# Patient Record
Sex: Female | Born: 1950 | Race: White | Hispanic: No | Smoking: Never smoker
Health system: Southern US, Community
[De-identification: ages and names within clinical notes are randomized; demographics above are authoritative.]

## PROBLEM LIST (undated history)

## (undated) DIAGNOSIS — M1990 Unspecified osteoarthritis, unspecified site: Secondary | ICD-10-CM

## (undated) DIAGNOSIS — F32A Depression, unspecified: Secondary | ICD-10-CM

## (undated) DIAGNOSIS — G5762 Lesion of plantar nerve, left lower limb: Secondary | ICD-10-CM

## (undated) DIAGNOSIS — E039 Hypothyroidism, unspecified: Secondary | ICD-10-CM

## (undated) DIAGNOSIS — R42 Dizziness and giddiness: Principal | ICD-10-CM

## (undated) DIAGNOSIS — K219 Gastro-esophageal reflux disease without esophagitis: Secondary | ICD-10-CM

## (undated) DIAGNOSIS — F329 Major depressive disorder, single episode, unspecified: Secondary | ICD-10-CM

## (undated) DIAGNOSIS — I10 Essential (primary) hypertension: Secondary | ICD-10-CM

## (undated) DIAGNOSIS — E119 Type 2 diabetes mellitus without complications: Secondary | ICD-10-CM

## (undated) HISTORY — DX: Hypothyroidism, unspecified: E03.9

## (undated) HISTORY — PX: TUBAL LIGATION: SHX77

## (undated) HISTORY — DX: Essential (primary) hypertension: I10

## (undated) HISTORY — PX: SHOULDER ARTHROSCOPY W/ ROTATOR CUFF REPAIR: SHX2400

## (undated) HISTORY — DX: Morbid (severe) obesity due to excess calories: E66.01

## (undated) HISTORY — PX: CARPAL TUNNEL RELEASE: SHX101

## (undated) HISTORY — PX: TOE SURGERY: SHX1073

## (undated) HISTORY — DX: Dizziness and giddiness: R42

---

## 1998-06-15 ENCOUNTER — Emergency Department (HOSPITAL_COMMUNITY): Admission: EM | Admit: 1998-06-15 | Discharge: 1998-06-15 | Payer: Self-pay | Admitting: Emergency Medicine

## 2001-10-06 ENCOUNTER — Ambulatory Visit (HOSPITAL_COMMUNITY): Admission: RE | Admit: 2001-10-06 | Discharge: 2001-10-06 | Payer: Self-pay | Admitting: Otolaryngology

## 2001-10-06 ENCOUNTER — Encounter: Payer: Self-pay | Admitting: Otolaryngology

## 2002-11-18 ENCOUNTER — Emergency Department (HOSPITAL_COMMUNITY): Admission: EM | Admit: 2002-11-18 | Discharge: 2002-11-18 | Payer: Self-pay | Admitting: Emergency Medicine

## 2002-11-24 ENCOUNTER — Encounter: Payer: Self-pay | Admitting: Orthopaedic Surgery

## 2002-11-24 ENCOUNTER — Ambulatory Visit (HOSPITAL_COMMUNITY): Admission: RE | Admit: 2002-11-24 | Discharge: 2002-11-24 | Payer: Self-pay | Admitting: Orthopaedic Surgery

## 2003-10-13 ENCOUNTER — Emergency Department (HOSPITAL_COMMUNITY): Admission: EM | Admit: 2003-10-13 | Discharge: 2003-10-13 | Payer: Self-pay | Admitting: Emergency Medicine

## 2004-04-17 ENCOUNTER — Ambulatory Visit: Payer: Self-pay | Admitting: Family Medicine

## 2004-05-01 ENCOUNTER — Emergency Department (HOSPITAL_COMMUNITY): Admission: EM | Admit: 2004-05-01 | Discharge: 2004-05-01 | Payer: Self-pay | Admitting: Emergency Medicine

## 2004-05-02 ENCOUNTER — Ambulatory Visit: Payer: Self-pay | Admitting: Orthopedic Surgery

## 2004-05-15 ENCOUNTER — Ambulatory Visit (HOSPITAL_COMMUNITY): Admission: RE | Admit: 2004-05-15 | Discharge: 2004-05-15 | Payer: Self-pay | Admitting: Orthopedic Surgery

## 2004-05-28 ENCOUNTER — Ambulatory Visit: Payer: Self-pay | Admitting: Orthopedic Surgery

## 2004-07-03 ENCOUNTER — Ambulatory Visit (HOSPITAL_COMMUNITY): Admission: RE | Admit: 2004-07-03 | Discharge: 2004-07-04 | Payer: Self-pay | Admitting: Orthopedic Surgery

## 2004-07-16 ENCOUNTER — Encounter: Admission: RE | Admit: 2004-07-16 | Discharge: 2004-10-14 | Payer: Self-pay | Admitting: Orthopedic Surgery

## 2004-10-15 ENCOUNTER — Encounter: Admission: RE | Admit: 2004-10-15 | Discharge: 2004-10-30 | Payer: Self-pay | Admitting: Orthopedic Surgery

## 2006-01-09 ENCOUNTER — Ambulatory Visit: Payer: Self-pay | Admitting: Cardiology

## 2006-01-17 ENCOUNTER — Ambulatory Visit: Payer: Self-pay | Admitting: Cardiology

## 2006-02-27 ENCOUNTER — Ambulatory Visit: Payer: Self-pay | Admitting: Cardiology

## 2006-03-06 ENCOUNTER — Ambulatory Visit: Payer: Self-pay | Admitting: Cardiovascular Disease

## 2006-03-06 ENCOUNTER — Inpatient Hospital Stay (HOSPITAL_BASED_OUTPATIENT_CLINIC_OR_DEPARTMENT_OTHER): Admission: RE | Admit: 2006-03-06 | Discharge: 2006-03-06 | Payer: Self-pay | Admitting: Cardiovascular Disease

## 2006-03-21 ENCOUNTER — Ambulatory Visit: Payer: Self-pay | Admitting: Cardiology

## 2006-10-22 ENCOUNTER — Emergency Department (HOSPITAL_COMMUNITY): Admission: EM | Admit: 2006-10-22 | Discharge: 2006-10-22 | Payer: Self-pay | Admitting: Emergency Medicine

## 2010-10-12 NOTE — Assessment & Plan Note (Signed)
[facility]                            [facility]   [name], [name]                     MRN:          [id number]  DATE:02/27/2006                            DOB:          1951/02/21    REASON FOR OFFICE VISIT:  Scheduled follow up.   The patient now returns to the clinic after an initial referral to Dr. Lewayne Bunting here in the clinic for a consultation evaluation of chest pain and a  recent abnormal exercise stress test.  Please refer to his dictated office  note of August 16 for full details.  Dr. Andee Lineman felt that the pains were  rather atypical and in the setting of a few cardiac risk factors, he  scheduled a follow up low level Adenosine stress test during which the  patient reported no chest pain.  There were no significant EKG changes.  The  perfusion images, however, which were reviewed by Dr. Andee Lineman, suggested  possible mild high anterolateral defect which appeared to be reversible.  However, the study was felt to be low risk.  The ejection fraction was 54%.   The patient reports that she continues to have occasional chest discomfort  described as an ache sensation in the upper chest.  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  She, otherwise, does not  experience such symptoms when at rest.  Of note, her initial episode of  chest pain was described as a squeezing sensation beneath the upper  sternum.  However, she has not had this kind of severe chest pain since  being seen here in the emergency room several weeks ago.   CURRENT MEDICATIONS:  1. Aspirin 81 daily.  2. Lasix 20 daily.  3. Zoloft 100 daily.  4. Levoxyl 0.075 daily.  5. Omega fish oil as directed.   PHYSICAL EXAMINATION:  VITAL SIGNS:  Blood pressure 110/62, pulse 60 and regular, weight 249.  GENERAL:  60 year old female, obese, sitting upright, in no  apparent  distress.  HEENT:  Normocephalic, atraumatic.  NECK:  Palpable bilateral carotid pulses without bruits; unable to assess  JVD secondary to neck girth.  LUNGS:  Clear to auscultation in all fields.  HEART:  Regular rate and rhythm (S1 and S2), no significant murmurs.  ABDOMEN:  Protuberant, nontender.  EXTREMITIES:  No significant edema.  NEUROLOGICAL:  No focal deficit.   IMPRESSION:  1. Recurrent chest pain.      a.     Low risk Adenosine stress Cardiolite; EF 54% in August 2007.  2. Multiple cardiac risk factors.      a.     Hyperlipidemia.      b.     Family history.  3. Treated hypothyroidism.   PLAN:  The results of the recent stress test were reviewed by Dr. Andee Lineman who  felt that this represented a low risk study.  However, given the fact that  she continues to have recurrent chest discomfort, particularly associated  with strenuous activity at  work, the recommendation is to proceed with  further testing.  The patient was presented with the options of either a  cardiac CT scan versus diagnostic coronary angiography and she has opted for  the later for definitive exclusion of significant coronary artery disease.  Therefore, she will be scheduled for a procedure in the [facility] next week.   In the interim, the patient is to remain on aspirin.  We will place her on  low dose Imdur and also provide her with a prescription for nitroglycerin.  Lasix will be held the morning of the procedure.      ______________________________  Rozell Searing, PA-C    ______________________________  Learta Codding, MD,FACC    GS/MedQ  DD:  02/27/2006  DT:  02/28/2006  Job #:  [id number]

## 2010-10-12 NOTE — Assessment & Plan Note (Signed)
[facility]                            [facility] NOTE   TOY, SAMARIN                     MRN:          [id number]  DATE:03/21/2006                            DOB:          [birth year]    REASON FOR OFFICE VISIT:  Post-catheterization followup.   Ms. Sonnier returns to the clinic after being referred for a diagnostic  cardiac catheterization for further evaluation of recurrent atypical chest  pain.  Please refer to my office note of October 4 for full details.   A cardiac catheterization was done earlier in the month by Dr. Excell Seltzer and  revealed angiographically normal coronary arteries, and a normal left  ventricle.   The patient reports no complications of the right groin incision site.   The patient continues to have atypical constant upper chest discomfort.  As  I had noted in my previous note, she also continues to experience  considerable stress at her job.   MEDICATIONS:  1. Aspirin 81 q. day.  2. Zoloft 100 q. day.  3. Levoxyl 0.075 q. day.  4. Lasix 20 q. day.   PHYSICAL EXAMINATION:  Blood pressure 134/80, pulse 66 regular, weight 253.  GENERAL:  A 60 year old female morbidly obese, in no distress.  LUNGS:  Clear to auscultation in all fields.  HEART:  Regular rate and rhythm (S1, S2).  No significant murmurs.  ABDOMEN:  Protuberant.  Nontender.  EXTREMITIES:  Right groin stable with no ecchymosis, hematoma, or bruit on  auscultation; intact femoral and distal pulse.   IMPRESSION:  1. Noncardiac chest pain.      a.     Normal coronary angiogram October 2007.      b.     False-positive adenosine stress Cardiolite August 2007.  2. Multiple cardiac risk factors.      a.     Hyperlipidemia.      b.     Borderline hypertension.      c.     Obesity.      d.     Family history.  3. Treated hypothyroidism.   PLAN:  No further cardiac workup is recommended.  The patient has been  reassured that her symptoms are  not cardiac in origin.  Therefore, she has  been advised to followup with Dr. Olena Leatherwood for any persistent chest pain.  She will return to Dr. Andee Lineman on an as needed basis.     ______________________________  Rozell Searing, PA-C    ______________________________  Learta Codding, MD,FACC   GS/MedQ  DD: 03/21/2006  DT: 03/23/2006  Job #: [id number]   cc:   Lia Hopping

## 2010-10-12 NOTE — H&P (Signed)
NAMEMIRZA, KIDNEY              ACCOUNT NO.:  [id number]   MEDICAL RECORD NO.:  [id number]          PATIENT TYPE:  AMB   LOCATION:  DAY                           FACILITY:  [facility]   PHYSICIAN:  Vickki Hearing, M.D.DATE OF BIRTH:  May 06, 1951   DATE OF ADMISSION:  05/15/2004  DATE OF DISCHARGE:  12/20/2005LH                                HISTORY & PHYSICAL   PREOPERATIVE HISTORY AND PHYSICAL   CHIEF COMPLAINT:  Left shoulder pain.   HISTORY OF PRESENT ILLNESS:  This is a 60 year old female who presented with  left shoulder pain which was severe and constant, brought on by a fall of  one day's duration back in May 02, 2004.  She described the pain as  aching, had trouble moving her arm, associated with weakness and stiffness.  MRI was done and showed a torn rotator cuff to the left shoulder.   The patient is given informed consent for left rotator cuff repair.  She  understands that the complications and potential risks and benefits of this  procedure.  This includes possibility of re-tear, failure to heal,  stiffness, and weakness of the left shoulder.  Other surgical risks  including bleeding, infection, anesthetic death.  This was discussed with  the patient on a preoperative visit on May 28, 2004.   REVIEW OF SYSTEMS:  The patient reports that she has had poor circulation,  phlebitis, joint pain, gout, thyroid disease, depression, sinusitis, and  sinus problems.  She denied any weight loss, weight gain, fever, chills,  breathing abnormalities, GI problems, urinary problems, neurologic, skin  problems, or lymph node system problems.   MEDICATIONS:  Allergic to penicillin and sulfa.   PAST MEDICAL HISTORY:  Medical problems are thyroid disease, depression, and  poor circulation.   PAST SURGICAL HISTORY:  Includes tubal ligation and a neuroma excision.   PHARMACY:  [facility] [place].   MEDICATIONS:  Synthroid and Zoloft.   FAMILY HISTORY:  Heart and lung disease,  arthritis and cancer.   Family physician is Dr. Colon Flattery of [place].   SOCIAL HISTORY:  She is married.  She works in [employer] in the  [employer].  She does not smoke or drink.  Grade completed:  1 through  11.   PHYSICAL EXAMINATION:  VITAL SIGNS:  Exam shows a weight of 255, pulse of 74, respiratory rate is  16.  GENERAL:  She is well developed and well nourished.  Grooming and hygiene  are normal.  Body habitus;  Large.  No deformities.  Normal grooming,  developmental and nutrition.  CARDIOVASCULAR:  No swelling, normal pulses, no edema or tenderness.  LYMPH:  Lymph nodes and cervical spine were normal.  MUSCULOSKELETAL:  Exam showed normal gait and station.  EXTREMITIES:  Left upper extremity showed painful range of motion.  Her  range of motion was limited to abduction of approximately 40 degrees,  external rotation 30 degrees, internal rotation zero.  Passive range of  motion hurt throughout the entire arc 0-120.  Her drop test was positive.  SKIN:  Normal with no bruising.  NEUROLOGIC/PSYCH:  Exam showed  normal findings.   MEDICAL DECISION MAKING:  Medical decision-making included radiographs that  showed no fracture and an MRI which showed a torn rotator cuff to the left  shoulder.   DIAGNOSIS:  Left rotator cuff tear.   PLAN:  Open left rotator cuff repair in a modified beach chair position on a  regular bed.  The patient understands she will need six months of  rehabilitation.     Weyman Croon   SEH/MEDQ  D:  05/30/2004  T:  05/30/2004  Job:  [id number]   cc:   Colon Flattery, D.O.  [address]  [place]  [place] [address]  Fax: [phone]

## 2010-10-12 NOTE — Op Note (Signed)
NAME:  [name], [name]              ACCOUNT NO.:  [id number]   MEDICAL RECORD NO.:  [id number]          PATIENT TYPE:  REC   LOCATION:  [facility]                         FACILITY:  [facility]   PHYSICIAN:  Burnard Bunting, M.D.    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   OPERATION PERFORMED:  Left shoulder manipulation under anesthesia with  extensive debridement of the rotator interval and intra-articular synovitis,  subacromial decompression and mini-open rotator cuff repair.   SURGEON:  Burnard Bunting, M.D.   ASSISTANT:  Jerolyn Shin. Tresa Res, M.D.   ANESTHESIA:  General endotracheal.   ESTIMATED BLOOD LOSS:  75 mL   DRAINS:  None.   DESCRIPTION OF PROCEDURE:  The patient was brought to the operating room  where general endotracheal was induced.  Preoperative antibiotics were  administered.  The patient was placed in the Schlein positioner.  She was  noted to have restricted external rotation on the left hand side at 15  degrees of abduction to about 40 degrees on the left compared to 70 on the  right.  She also was lacking a full 30 degrees of forward flexion.  The  patient's arm was gently manipulated into full forward flexion by minimizing  a fulcrum length, placing the right hand near the armpit.  Audible tearing  of tissue was noted.  The patient was then manipulated into full abduction  and external rotation.  The patient was placed in Schlein positioner and  then set upright.  The right arm and both legs were well padded.  The  patient's right arm, shoulder and hand were then prepped with DuraPrep  solution and then draped in sterile manner.  Topographical anatomy of the  left  shoulder was identified including posterolateral and anterior margins  of the acromion as well as the coracoid process.  Solution of saline with  epinephrine was injected in the subacromial space.  Saline was then injected  into the visible joint.  Diagnostic arthroscopy was performed.  The patient  was noted to have synovitis associated with frozen shoulder.  This was  present in the rotator interval.  The patient also had a moderate to large  rotator cuff tear of the supraspinatus, measuring approximately 2.5 x 2.5  cm.  The biceps anchor itself was stable and intact.  Anterior portal was  created under direct visualization with the spinal needle and extensive  rotator interval of debridement was performed between the biceps tendon and  subscapularis.  Synovitis was also debrided from the rotator interval, from  the rotator cuff, the capsule.  Glenohumeral articular surfaces were intact.  Biceps tendon was otherwise intact.  At this time the scope was placed  through the subacromial space.  Bursectomy and subacromial decompression was  performed with release of resection of the coracoacromial ligament.  At this  time instruments were removed from the portals.  The anterior and posterior  portal were closed using 3-0 nylon suture.  An incision was then made off  the anterolateral margin of the acromion.  Skin and subcutaneous tissue were  sharply divided.  Deltoid was split for measured distance of 4 cm from the  anterolateral margin of the acromion.  Stay suture was placed at the deltoid  split.  Rotator cuff tear was visualized.  The rotator cuff tear involved  the anterior edge of the supraspinatus with some retraction posteriorly.  The tear was mobilized anteriorly and reattached to a prepared bleeding bony  bed using a combination of curved bone tunnels and suture anchors.  In this  manner, the rotator cuff was reapproximated to its attachment on the greater  tuberosity.  Arm was taken  through a range of motion and repair was found to  be stable.  No impingement was present.  At this time the incision was  thoroughly irrigated and closed using a combination of #1 Vicryl suture to  reapproximate the deltoid split.  Skin was closed using interrupted inverted  0 Vicryl, 2-0 Vicryl and running 3-0 pullout Prolene.  The patient was then  placed in the bulky dressing and shoulder immobilizer.  The patient  tolerated the procedure well without immediate complication.      GSD/MEDQ  D:  07/18/2004  T:  07/18/2004  Job:  [id number]

## 2010-10-12 NOTE — Assessment & Plan Note (Signed)
[facility]                            [facility]   [name], [name]                     MRN:          [id number]  DATE:01/09/2006                            DOB:          11-16-1950    REASON FOR CONSULTATION:  Follow up abnormal exercise treadmill test  performed by Dr. Olena Leatherwood.   PRESENT ILLNESS:  The patient is a 60 year old female with no prior cardiac  history.  The patient, several weeks ago, experienced an episode of right-  sided neck pain.  She stated that the pain was radiating down into the  chest.  I felt like a sharp, knife-like pain with a throbbing sensation.  She stated that the pain lasted several hours and it prompted her to come to  the emergency room.  She ruled out for myocardial infarction.  Sometime  later, the patient underwent an exercise treadmill test performed by Dr.  Olena Leatherwood which showed a parallel lateral ST segment depression.  Since that  time, the patient has had no further substernal chest pain.  She is not  physically very active and does report dyspnea on walking a flights of  stairs.  She denies having any heavy substernal chest pressure on exertion.  She has no orthopnea or PND.  She has no palpitations or syncope.  She did  receive a workup for gallbladder disease, which was reportedly negative.   ALLERGIES:  PENICILLIN, SULFA.   MEDICATIONS:  Calcium, magnesium, zinc, Ultimate Omega, fish oil, furosemide  20 mg a day, Levoxyl 75 mg a day, and Zoloft.   PAST MEDICAL HISTORY:  History of hypothyroidism and tubal ligation.   SOCIAL:  The patient denies any tobacco or alcohol use.  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   FAMILY HISTORY:  Notable for both mother and father that had heart disease  but at a later age.   REVIEW OF SYSTEMS:  As per HPI.  No nausea or vomiting.  No fever or chills.  No melena or hematochezia.  No dysuria or frequency.   PHYSICAL EXAMINATION:   VITAL SIGNS:  Blood pressure is 132/80, heart rate is  56 beats per minute.  NECK:  Exam reveals normal carotid upstroke.  No carotid bruits.  LUNGS:  Clear breath sounds bilaterally.  HEART:  Regular rate and rhythm.  Normal S1, S2.  No murmurs, rubs, or  gallops.  ABDOMEN:  Soft, nontender.  No rebound or guarding.  Good bowel sounds.  EXTREMITY EXAM:  Reveals no cyanosis, clubbing, or edema.  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   PROBLEM LIST:  1. Atypical chest pain.  2. Abnormal exercise treadmill test with lateral ST segment changes.  Rule      out false-positive study.  3. Minor cardiac risk factors.  4. Venous insufficiency.   PLAN:  1. The patient's symptoms are rather atypical for angina.  The patient has      a few cardiac risk factors.  2. The patient could have had a false-positive exercise treadmill test,      and given  her low-to intermediate risk for coronary artery disease, we      will proceed with an adenosine Cardiolite study with 4-minute protocol.      The patient will follow up after test results, and if positive, a      consideration needs to be given to cardiac catheterization.                                   Learta Codding, MD, [facility]   GED/MedQ  DD:  01/09/2006  DT:  01/09/2006  Job #:  [id number]   cc:   Lia Hopping

## 2010-10-12 NOTE — Cardiovascular Report (Signed)
NAMETEEA, DUCEY              ACCOUNT NO.:  [id number]   MEDICAL RECORD NO.:  [id number]          PATIENT TYPE:  OIB   LOCATION:  1961                         FACILITY:  [facility]   PHYSICIAN:  Veverly Fells. Excell Seltzer, MD  DATE OF BIRTH:  [birth year]   DATE OF PROCEDURE:  DATE OF DISCHARGE:                              CARDIAC CATHETERIZATION   DATE OF PROCEDURE:  March 06, 2006.   PROCEDURES:  1. Left heart catheterization.  2. Selective coronary angiography.  3. Left ventricular angiography.   INDICATION:  Mrs. [name] is a 60 year old woman who has had recurrent chest  pain and underwent an adenosine myocardial perfusion scan that was abnormal.  In the setting of her persistent chest pain and abnormal cardiovascular  functional study, she was referred for cardiac catheterization.   Access right femoral artery, 4-French.   COMPLICATIONS:  None.   PROCEDURAL DETAILS:  The risks and indications of the procedure were  explained in detail to the patient.  Informed consent was obtained.  The  right groin was prepped, draped and anesthetized with 1% lidocaine.  Using a  modified Seldinger technique, a 4-French arterial sheath was placed in the  right common femoral artery.  Multiple angiographic views of the left and  right coronary arteries were taken.  The left coronary artery was injected  with a preformed 4-French JL-4 catheter.  The right coronary artery was  imaged with a preformed 4-French 3DRC catheter.  Following selective  coronary angiography, a 4-French angled pigtail catheter was placed into the  left ventricle.  Left ventricular pressures were recorded.  A 30-degree RAL  left ventriculogram was performed.  Following left ventriculography, a  pullback across the aortic valve was done.  At the conclusion of the case,  the right femoral arterial sheath was pulled and manual pressure was used  for hemostasis.   FINDINGS:  Aortic pressure 136/76 with a mean of 101.  Left  ventricular  pressure was 151/18 with an end-diastolic pressure of 23.  On the pullback,  the left ventricular systolic pressure was 145 and the aortic systolic  pressure was 144.  There was no gradient.   Selective coronary angiography:  The left mainstem is angiographically  normal.  It bifurcates into the LAD and left circumflex.  The LAD is medium  caliber.  It courses down to the left ventricular apex.  It gives off a very  small first diagonal and then a medium-caliber second diagonal branch.  The  LAD and diagonal branches are angiographically normal.   The left circumflex is large diameter.  It supplies two very small obtuse  marginal branches and then a large left posterolateral branch that  bifurcates into twin vessels.  The left circumflex system is  angiographically normal.   Right coronary artery:  The right coronary artery is dominant.  It is medium  caliber.  Distally, it bifurcates into a PDA as well as one medium-sized  posterolateral branch.  There are multiple small PLs.  The right coronary  artery is angiographically normal.   Left ventriculogram performed on the third-degree RAO projections  demonstrates normal  systolic left ventricular function.  Left ventricular  ejection fraction 65%, no mitral regurgitation is present.   ASSESSMENT:  1. Normal coronary arteries.  2. Normal left ventricular function.   Patient should continue with risk factor modification.  She does not have  any significant cardiac disease seen on catheterization.  Of note, she did  have an increased left ventricular end-diastolic pressure and will require  monitoring of her blood pressure as an outpatient.  She does not carry the  diagnosis of hypertension and in reviewing in Dr. Margarita Mail most recent note,  her blood pressure was normal on his evaluation.      Veverly Fells. Excell Seltzer, MD  Electronically Signed     MDC/MEDQ  D:  03/06/2006  T:  03/06/2006  Job:  [id number]   cc:   Learta Codding, MD,FACC

## 2012-10-02 ENCOUNTER — Other Ambulatory Visit: Payer: Self-pay | Admitting: Orthopedic Surgery

## 2012-10-02 DIAGNOSIS — M25511 Pain in right shoulder: Secondary | ICD-10-CM

## 2012-10-12 ENCOUNTER — Ambulatory Visit
Admission: RE | Admit: 2012-10-12 | Discharge: 2012-10-12 | Disposition: A | Discharge status: Home / Self Care | Payer: BC Managed Care – PPO | Source: Ambulatory Visit | Attending: Orthopedic Surgery | Admitting: Orthopedic Surgery

## 2012-10-12 DIAGNOSIS — M25511 Pain in right shoulder: Secondary | ICD-10-CM

## 2012-10-12 MED ORDER — IOHEXOL 180 MG/ML  SOLN
15.0000 mL | Freq: Once | INTRAMUSCULAR | Status: AC | PRN
  Administered 2012-10-12: 15 mL via INTRA_ARTICULAR

## 2012-12-08 ENCOUNTER — Encounter (HOSPITAL_COMMUNITY): Payer: Self-pay | Admitting: Pharmacy Technician

## 2012-12-10 ENCOUNTER — Other Ambulatory Visit: Payer: Self-pay | Admitting: Orthopedic Surgery

## 2012-12-14 ENCOUNTER — Encounter (HOSPITAL_COMMUNITY)
Admission: RE | Admit: 2012-12-14 | Discharge: 2012-12-14 | Disposition: A | Discharge status: Home / Self Care | Payer: BC Managed Care – PPO | Source: Ambulatory Visit | Attending: Orthopedic Surgery | Admitting: Orthopedic Surgery

## 2012-12-14 ENCOUNTER — Encounter (HOSPITAL_COMMUNITY): Payer: Self-pay

## 2012-12-14 DIAGNOSIS — Z01810 Encounter for preprocedural cardiovascular examination: Secondary | ICD-10-CM | POA: Insufficient documentation

## 2012-12-14 DIAGNOSIS — Z01818 Encounter for other preprocedural examination: Secondary | ICD-10-CM | POA: Insufficient documentation

## 2012-12-14 DIAGNOSIS — Z01812 Encounter for preprocedural laboratory examination: Secondary | ICD-10-CM | POA: Insufficient documentation

## 2012-12-14 HISTORY — DX: Type 2 diabetes mellitus without complications: E11.9

## 2012-12-14 HISTORY — DX: Lesion of plantar nerve, left lower limb: G57.62

## 2012-12-14 HISTORY — DX: Gastro-esophageal reflux disease without esophagitis: K21.9

## 2012-12-14 HISTORY — DX: Major depressive disorder, single episode, unspecified: F32.9

## 2012-12-14 HISTORY — DX: Unspecified osteoarthritis, unspecified site: M19.90

## 2012-12-14 HISTORY — DX: Depression, unspecified: F32.A

## 2012-12-14 LAB — COMPREHENSIVE METABOLIC PANEL
ALT: 36 U/L — ABNORMAL HIGH (ref 0–35)
AST: 27 U/L (ref 0–37)
Alkaline Phosphatase: 56 U/L (ref 39–117)
CO2: 28 mEq/L (ref 19–32)
Calcium: 9.2 mg/dL (ref 8.4–10.5)
Chloride: 103 mEq/L (ref 96–112)
GFR calc non Af Amer: >90 mL/min (ref >90)
Glucose, Bld: 125 mg/dL — ABNORMAL HIGH (ref 70–99)
Potassium: 4.6 mEq/L (ref 3.5–5.1)
Sodium: 140 mEq/L (ref 135–145)

## 2012-12-14 LAB — CBC
Hemoglobin: 13.5 g/dL (ref 12.0–15.0)
Platelets: 212 10*3/uL (ref 150–400)
RBC: 4.3 MIL/uL (ref 3.87–5.11)
WBC: 6.2 10*3/uL (ref 4.0–10.5)

## 2012-12-14 NOTE — Pre-Procedure Instructions (Signed)
[name]  12/14/2012   Your procedure is scheduled on:  Tuesday, July 29th   Report to [facility] at 9:00 AM.  Call this number if you have problems the morning of surgery: [phone]   Remember:   Do not eat food or drink liquids after midnight Monday.   Take these medicines the morning of surgery with A SIP OF WATER:  Zoloft, Levothyroxine   Do not wear jewelry, make-up or nail polish.  Do not wear lotions, powders, or perfumes. You may NOT wear deodorant.  Do not shave underarms & legs 48 hours prior to surgery.    Do not bring valuables to the hospital.  [facility] is not responsible  for any belongings or valuables.   Contacts, dentures or bridgework may not be worn into surgery.  Leave suitcase in the car. After surgery it may be brought to your room.  For patients admitted to the hospital, checkout time is 11:00 AM the day of discharge.   Patients discharged the day of surgery will not be allowed to drive home and will need              A responsible adult to stay with you for the first 24 hrs.   Name and phone number of your driver:    Special Instructions: Shower using CHG 2 nights before surgery and the night before surgery.  If you shower the day of surgery use CHG.  Use special wash - you have one bottle of CHG for all showers.  You should use approximately 1/3 of the bottle for each shower.   Please read over the following fact sheets that you were given: Pain Booklet and Surgical Site Infection Prevention

## 2012-12-14 NOTE — Progress Notes (Signed)
Pt has not seen cardio for a while now....denies any cp, sob, etc.  She actually cannot remember when and why she went back in 2007...Marland KitchenDA Sees Prudy Feeler up in [place] @ [facility].........DA

## 2012-12-22 ENCOUNTER — Observation Stay (HOSPITAL_COMMUNITY)
Admission: RE | Admit: 2012-12-22 | Discharge: 2012-12-23 | Disposition: A | Discharge status: Home / Self Care | Payer: BC Managed Care – PPO | Source: Ambulatory Visit | Attending: Orthopedic Surgery | Admitting: Orthopedic Surgery

## 2012-12-22 ENCOUNTER — Encounter (HOSPITAL_COMMUNITY): Payer: Self-pay | Admitting: Anesthesiology

## 2012-12-22 ENCOUNTER — Ambulatory Visit (HOSPITAL_COMMUNITY): Payer: BC Managed Care – PPO | Admitting: Anesthesiology

## 2012-12-22 ENCOUNTER — Encounter (HOSPITAL_COMMUNITY): Payer: Self-pay | Admitting: *Deleted

## 2012-12-22 ENCOUNTER — Encounter (HOSPITAL_COMMUNITY)
Admission: RE | Disposition: A | Discharge status: Home / Self Care | Payer: Self-pay | Source: Ambulatory Visit | Attending: Orthopedic Surgery

## 2012-12-22 DIAGNOSIS — G5600 Carpal tunnel syndrome, unspecified upper limb: Secondary | ICD-10-CM | POA: Insufficient documentation

## 2012-12-22 DIAGNOSIS — M719 Bursopathy, unspecified: Principal | ICD-10-CM | POA: Insufficient documentation

## 2012-12-22 DIAGNOSIS — M75101 Unspecified rotator cuff tear or rupture of right shoulder, not specified as traumatic: Secondary | ICD-10-CM

## 2012-12-22 DIAGNOSIS — M67919 Unspecified disorder of synovium and tendon, unspecified shoulder: Principal | ICD-10-CM | POA: Insufficient documentation

## 2012-12-22 DIAGNOSIS — E119 Type 2 diabetes mellitus without complications: Secondary | ICD-10-CM | POA: Insufficient documentation

## 2012-12-22 DIAGNOSIS — Z5333 Arthroscopic surgical procedure converted to open procedure: Secondary | ICD-10-CM | POA: Insufficient documentation

## 2012-12-22 HISTORY — PX: SHOULDER ARTHROSCOPY WITH ROTATOR CUFF REPAIR: SHX5685

## 2012-12-22 HISTORY — PX: CARPAL TUNNEL RELEASE: SHX101

## 2012-12-22 LAB — GLUCOSE, CAPILLARY: Glucose-Capillary: 199 mg/dL — ABNORMAL HIGH (ref 70–99)

## 2012-12-22 SURGERY — ARTHROSCOPY, SHOULDER, WITH ROTATOR CUFF REPAIR
Anesthesia: General | Site: Wrist | Laterality: Right | Wound class: Clean

## 2012-12-22 MED ORDER — LIDOCAINE HCL (CARDIAC) 20 MG/ML IV SOLN
INTRAVENOUS | Status: DC | PRN
  Administered 2012-12-22: 40 mg via INTRAVENOUS

## 2012-12-22 MED ORDER — MIDAZOLAM HCL 2 MG/2ML IJ SOLN
INTRAMUSCULAR | Status: AC
  Administered 2012-12-22: 1 mg via INTRAVENOUS
  Filled 2012-12-22: qty 2

## 2012-12-22 MED ORDER — ACETAMINOPHEN 650 MG RE SUPP: 650.0000 mg | Freq: Four times a day (QID) | RECTAL | Status: DC | PRN

## 2012-12-22 MED ORDER — POLYVINYL ALCOHOL 1.4 % OP SOLN
2.0000 [drp] | Freq: Two times a day (BID) | OPHTHALMIC | Status: DC
  Filled 2012-12-22: qty 15

## 2012-12-22 MED ORDER — OXYCODONE HCL 5 MG PO TABS
5.0000 mg | ORAL_TABLET | ORAL | Status: DC | PRN
  Administered 2012-12-23 (×3): 10 mg via ORAL
  Filled 2012-12-22 (×3): qty 2

## 2012-12-22 MED ORDER — SERTRALINE HCL 100 MG PO TABS
100.0000 mg | ORAL_TABLET | Freq: Every day | ORAL | Status: DC
  Administered 2012-12-22 – 2012-12-23 (×2): 100 mg via ORAL
  Filled 2012-12-22 (×2): qty 1

## 2012-12-22 MED ORDER — SODIUM CHLORIDE 0.9 % IR SOLN
Status: DC | PRN
  Administered 2012-12-22: 6000 mL

## 2012-12-22 MED ORDER — MELOXICAM 7.5 MG PO TABS
7.5000 mg | ORAL_TABLET | Freq: Every day | ORAL | Status: DC
  Administered 2012-12-22 – 2012-12-23 (×2): 7.5 mg via ORAL
  Filled 2012-12-22 (×2): qty 1

## 2012-12-22 MED ORDER — OXYCODONE HCL 5 MG PO TABS: 5.0000 mg | ORAL_TABLET | Freq: Once | ORAL | Status: DC | PRN

## 2012-12-22 MED ORDER — FENTANYL CITRATE 0.05 MG/ML IJ SOLN: 50.0000 ug | INTRAMUSCULAR | Status: DC | PRN

## 2012-12-22 MED ORDER — FENTANYL CITRATE 0.05 MG/ML IJ SOLN
INTRAMUSCULAR | Status: DC | PRN
  Administered 2012-12-22: 50 ug via INTRAVENOUS
  Administered 2012-12-22: 100 ug via INTRAVENOUS

## 2012-12-22 MED ORDER — CLINDAMYCIN PHOSPHATE 900 MG/50ML IV SOLN
INTRAVENOUS | Status: DC | PRN
  Administered 2012-12-22: 900 mg via INTRAVENOUS

## 2012-12-22 MED ORDER — MIDAZOLAM HCL 2 MG/2ML IJ SOLN: 1.0000 mg | INTRAMUSCULAR | Status: DC | PRN

## 2012-12-22 MED ORDER — CLINDAMYCIN PHOSPHATE 600 MG/50ML IV SOLN
600.0000 mg | Freq: Four times a day (QID) | INTRAVENOUS | Status: AC
  Administered 2012-12-22 – 2012-12-23 (×3): 600 mg via INTRAVENOUS
  Filled 2012-12-22 (×3): qty 50

## 2012-12-22 MED ORDER — POLYETHYL GLYCOL-PROPYL GLYCOL 0.4-0.3 % OP SOLN: 2.0000 [drp] | Freq: Two times a day (BID) | OPHTHALMIC | Status: DC

## 2012-12-22 MED ORDER — BUPIVACAINE HCL (PF) 0.25 % IJ SOLN
INTRAMUSCULAR | Status: DC | PRN
  Administered 2012-12-22: 15 mL

## 2012-12-22 MED ORDER — ONDANSETRON HCL 4 MG/2ML IJ SOLN: 4.0000 mg | Freq: Four times a day (QID) | INTRAMUSCULAR | Status: DC | PRN

## 2012-12-22 MED ORDER — SODIUM CHLORIDE 0.9 % IV SOLN
10.0000 mg | INTRAVENOUS | Status: DC | PRN
  Administered 2012-12-22: 10 ug/min via INTRAVENOUS

## 2012-12-22 MED ORDER — 0.9 % SODIUM CHLORIDE (POUR BTL) OPTIME
TOPICAL | Status: DC | PRN
  Administered 2012-12-22 (×3): 1000 mL

## 2012-12-22 MED ORDER — METOCLOPRAMIDE HCL 5 MG/ML IJ SOLN: 5.0000 mg | Freq: Three times a day (TID) | INTRAMUSCULAR | Status: DC | PRN

## 2012-12-22 MED ORDER — MENTHOL 3 MG MT LOZG: 1.0000 | LOZENGE | OROMUCOSAL | Status: DC | PRN

## 2012-12-22 MED ORDER — METOCLOPRAMIDE HCL 5 MG/ML IJ SOLN: 10.0000 mg | Freq: Once | INTRAMUSCULAR | Status: DC | PRN

## 2012-12-22 MED ORDER — OXYCODONE HCL 5 MG/5ML PO SOLN: 5.0000 mg | Freq: Once | ORAL | Status: DC | PRN

## 2012-12-22 MED ORDER — DOCUSATE SODIUM 100 MG PO CAPS
100.0000 mg | ORAL_CAPSULE | Freq: Two times a day (BID) | ORAL | Status: DC
  Administered 2012-12-22 – 2012-12-23 (×2): 100 mg via ORAL
  Filled 2012-12-22 (×3): qty 1

## 2012-12-22 MED ORDER — ASPIRIN 325 MG PO TABS
325.0000 mg | ORAL_TABLET | Freq: Every day | ORAL | Status: DC
  Administered 2012-12-22 – 2012-12-23 (×2): 325 mg via ORAL
  Filled 2012-12-22 (×2): qty 1

## 2012-12-22 MED ORDER — ONDANSETRON HCL 4 MG/2ML IJ SOLN
INTRAMUSCULAR | Status: DC | PRN
  Administered 2012-12-22 (×2): 4 mg via INTRAVENOUS

## 2012-12-22 MED ORDER — METFORMIN HCL 500 MG PO TABS
500.0000 mg | ORAL_TABLET | Freq: Every day | ORAL | Status: DC
  Administered 2012-12-23: 500 mg via ORAL
  Filled 2012-12-22 (×2): qty 1

## 2012-12-22 MED ORDER — FENTANYL CITRATE 0.05 MG/ML IJ SOLN
INTRAMUSCULAR | Status: AC
  Administered 2012-12-22: 50 ug via INTRAVENOUS
  Filled 2012-12-22: qty 2

## 2012-12-22 MED ORDER — HYDROMORPHONE HCL PF 1 MG/ML IJ SOLN: 0.5000 mg | INTRAMUSCULAR | Status: DC | PRN

## 2012-12-22 MED ORDER — PHENOL 1.4 % MT LIQD: 1.0000 | OROMUCOSAL | Status: DC | PRN

## 2012-12-22 MED ORDER — ACETAMINOPHEN 325 MG PO TABS
650.0000 mg | ORAL_TABLET | Freq: Four times a day (QID) | ORAL | Status: DC | PRN
  Administered 2012-12-22: 650 mg via ORAL
  Filled 2012-12-22: qty 2

## 2012-12-22 MED ORDER — CLINDAMYCIN PHOSPHATE 900 MG/50ML IV SOLN
900.0000 mg | INTRAVENOUS | Status: DC
  Filled 2012-12-22: qty 50

## 2012-12-22 MED ORDER — HYDROMORPHONE HCL PF 1 MG/ML IJ SOLN: 0.2500 mg | INTRAMUSCULAR | Status: DC | PRN

## 2012-12-22 MED ORDER — DEXAMETHASONE SODIUM PHOSPHATE 4 MG/ML IJ SOLN
INTRAMUSCULAR | Status: DC | PRN
  Administered 2012-12-22: 4 mg via INTRAVENOUS

## 2012-12-22 MED ORDER — EPHEDRINE SULFATE 50 MG/ML IJ SOLN
INTRAMUSCULAR | Status: DC | PRN
  Administered 2012-12-22: 5 mg via INTRAVENOUS
  Administered 2012-12-22 (×4): 10 mg via INTRAVENOUS
  Administered 2012-12-22: 5 mg via INTRAVENOUS

## 2012-12-22 MED ORDER — LEVOTHYROXINE SODIUM 75 MCG PO TABS
75.0000 ug | ORAL_TABLET | Freq: Every day | ORAL | Status: DC
  Administered 2012-12-23: 75 ug via ORAL
  Filled 2012-12-22 (×2): qty 1

## 2012-12-22 MED ORDER — POTASSIUM CHLORIDE IN NACL 20-0.9 MEQ/L-% IV SOLN
INTRAVENOUS | Status: AC
  Administered 2012-12-22: 18:00:00 via INTRAVENOUS
  Filled 2012-12-22 (×2): qty 1000

## 2012-12-22 MED ORDER — ONDANSETRON HCL 4 MG PO TABS: 4.0000 mg | ORAL_TABLET | Freq: Four times a day (QID) | ORAL | Status: DC | PRN

## 2012-12-22 MED ORDER — METOCLOPRAMIDE HCL 10 MG PO TABS: 5.0000 mg | ORAL_TABLET | Freq: Three times a day (TID) | ORAL | Status: DC | PRN

## 2012-12-22 MED ORDER — LACTATED RINGERS IV SOLN
INTRAVENOUS | Status: DC
  Administered 2012-12-22 (×2): via INTRAVENOUS

## 2012-12-22 MED ORDER — PROPOFOL 10 MG/ML IV BOLUS
INTRAVENOUS | Status: DC | PRN
  Administered 2012-12-22: 200 mg via INTRAVENOUS

## 2012-12-22 MED ORDER — ROCURONIUM BROMIDE 100 MG/10ML IV SOLN
INTRAVENOUS | Status: DC | PRN
  Administered 2012-12-22: 50 mg via INTRAVENOUS

## 2012-12-22 SURGICAL SUPPLY — 102 items
ANCHOR CORKSCREW BIO 5.5 FT (Anchor) ×6 IMPLANT
BANDAGE ELASTIC 3 VELCRO ST LF (GAUZE/BANDAGES/DRESSINGS) ×6 IMPLANT
BANDAGE ELASTIC 4 VELCRO ST LF (GAUZE/BANDAGES/DRESSINGS) ×3 IMPLANT
BANDAGE GAUZE ELAST BULKY 4 IN (GAUZE/BANDAGES/DRESSINGS) ×3 IMPLANT
BENZOIN TINCTURE PRP APPL 2/3 (GAUZE/BANDAGES/DRESSINGS) ×3 IMPLANT
BIT DRILL TAK (DRILL) IMPLANT
BLADE CUDA 5.5 (BLADE) IMPLANT
BLADE CUTTER GATOR 3.5 (BLADE) ×3 IMPLANT
BLADE GREAT WHITE 4.2 (BLADE) ×3 IMPLANT
BLADE SURG 11 STRL SS (BLADE) ×3 IMPLANT
BLADE SURG 15 STRL LF DISP TIS (BLADE) ×2 IMPLANT
BLADE SURG 15 STRL SS (BLADE) ×1
BNDG ESMARK 4X9 LF (GAUZE/BANDAGES/DRESSINGS) ×3 IMPLANT
BUR GATOR 2.9 (BURR) IMPLANT
BUR OVAL 6.0 (BURR) ×3 IMPLANT
CANNULA SHOULDER 7CM (CANNULA) IMPLANT
CARTRIDGE CURVETEK MED (MISCELLANEOUS) IMPLANT
CARTRIDGE CURVETEK XLRG (MISCELLANEOUS) IMPLANT
CLOTH BEACON ORANGE TIMEOUT ST (SAFETY) ×3 IMPLANT
CLSR STERI-STRIP ANTIMIC 1/2X4 (GAUZE/BANDAGES/DRESSINGS) ×3 IMPLANT
CORDS BIPOLAR (ELECTRODE) ×6 IMPLANT
COVER SURGICAL LIGHT HANDLE (MISCELLANEOUS) ×3 IMPLANT
CUFF TOURNIQUET SINGLE 18IN (TOURNIQUET CUFF) ×6 IMPLANT
CUFF TOURNIQUET SINGLE 24IN (TOURNIQUET CUFF) IMPLANT
DRAPE INCISE IOBAN 66X45 STRL (DRAPES) ×9 IMPLANT
DRAPE STERI 35X30 U-POUCH (DRAPES) ×3 IMPLANT
DRAPE SURG 17X23 STRL (DRAPES) ×3 IMPLANT
DRAPE U-SHAPE 47X51 STRL (DRAPES) ×6 IMPLANT
DRILL TAK (DRILL)
DRSG PAD ABDOMINAL 8X10 ST (GAUZE/BANDAGES/DRESSINGS) ×6 IMPLANT
DURAPREP 26ML APPLICATOR (WOUND CARE) ×3 IMPLANT
ELECT MENISCUS 165MM 90D (ELECTRODE) IMPLANT
ELECT REM PT RETURN 9FT ADLT (ELECTROSURGICAL) ×3
ELECTRODE REM PT RTRN 9FT ADLT (ELECTROSURGICAL) ×2 IMPLANT
FILTER STRAW FLUID ASPIR (MISCELLANEOUS) ×3 IMPLANT
GAUZE XEROFORM 1X8 LF (GAUZE/BANDAGES/DRESSINGS) ×6 IMPLANT
GLOVE BIO SURGEON ST LM GN SZ9 (GLOVE) ×3 IMPLANT
GLOVE BIO SURGEON STRL SZ8.5 (GLOVE) ×3 IMPLANT
GLOVE BIOGEL PI IND STRL 8 (GLOVE) ×2 IMPLANT
GLOVE BIOGEL PI INDICATOR 8 (GLOVE) ×1
GLOVE ECLIPSE 7.0 STRL STRAW (GLOVE) ×3 IMPLANT
GLOVE SURG ORTHO 8.0 STRL STRW (GLOVE) ×3 IMPLANT
GLOVE SURG SS PI 7.0 STRL IVOR (GLOVE) ×3 IMPLANT
GOWN PREVENTION PLUS LG XLONG (DISPOSABLE) IMPLANT
GOWN PREVENTION PLUS XLARGE (GOWN DISPOSABLE) ×3 IMPLANT
GOWN STRL NON-REIN LRG LVL3 (GOWN DISPOSABLE) ×9 IMPLANT
KIT BASIN OR (CUSTOM PROCEDURE TRAY) ×3 IMPLANT
KIT BIO-TENODESIS 3X8 DISP (MISCELLANEOUS) ×1
KIT INSRT BABSR STRL DISP BTN (MISCELLANEOUS) ×2 IMPLANT
KIT ROOM TURNOVER OR (KITS) ×3 IMPLANT
LOOP VESSEL MAXI BLUE (MISCELLANEOUS) IMPLANT
MANIFOLD NEPTUNE II (INSTRUMENTS) ×3 IMPLANT
NDL SUT 6 .5 CRC .975X.05 MAYO (NEEDLE) ×2 IMPLANT
NEEDLE HYPO 25GX1X1/2 BEV (NEEDLE) IMPLANT
NEEDLE HYPO 25X1 1.5 SAFETY (NEEDLE) ×3 IMPLANT
NEEDLE MAYO TAPER (NEEDLE) ×1
NEEDLE SPNL 18GX3.5 QUINCKE PK (NEEDLE) ×3 IMPLANT
NS IRRIG 1000ML POUR BTL (IV SOLUTION) ×3 IMPLANT
PACK ORTHO EXTREMITY (CUSTOM PROCEDURE TRAY) ×3 IMPLANT
PACK SHOULDER (CUSTOM PROCEDURE TRAY) ×3 IMPLANT
PAD ARMBOARD 7.5X6 YLW CONV (MISCELLANEOUS) ×6 IMPLANT
PAD CAST 3X4 CTTN HI CHSV (CAST SUPPLIES) ×2 IMPLANT
PAD CAST 4YDX4 CTTN HI CHSV (CAST SUPPLIES) ×4 IMPLANT
PADDING CAST COTTON 3X4 STRL (CAST SUPPLIES) ×1
PADDING CAST COTTON 4X4 STRL (CAST SUPPLIES) ×2
PUSHLOCK PEEK 4.5X24 (Orthopedic Implant) ×6 IMPLANT
SCREW BIO TENODEIS 7MM (Screw) ×3 IMPLANT
SET ARTHROSCOPY TUBING (MISCELLANEOUS) ×1
SET ARTHROSCOPY TUBING LN (MISCELLANEOUS) ×2 IMPLANT
SLING ARM FOAM STRAP XLG (SOFTGOODS) ×3 IMPLANT
SLING ARM IMMOBILIZER MED (SOFTGOODS) IMPLANT
SPEAR FASTAKII (SLEEVE) IMPLANT
SPLINT PLASTER CAST XFAST 3X15 (CAST SUPPLIES) ×2 IMPLANT
SPLINT PLASTER XTRA FASTSET 3X (CAST SUPPLIES) ×1
SPONGE GAUZE 4X4 12PLY (GAUZE/BANDAGES/DRESSINGS) ×6 IMPLANT
SPONGE LAP 4X18 X RAY DECT (DISPOSABLE) ×6 IMPLANT
STRIP CLOSURE SKIN 1/2X4 (GAUZE/BANDAGES/DRESSINGS) ×3 IMPLANT
SUCTION FRAZIER TIP 10 FR DISP (SUCTIONS) ×3 IMPLANT
SUT ETHILON 3 0 PS 1 (SUTURE) ×9 IMPLANT
SUT FIBERWIRE 2-0 18 17.9 3/8 (SUTURE)
SUT PROLENE 3 0 PS 2 (SUTURE) ×3 IMPLANT
SUT VIC AB 0 CT1 27 (SUTURE) ×2
SUT VIC AB 0 CT1 27XBRD ANBCTR (SUTURE) ×4 IMPLANT
SUT VIC AB 1 CT1 27 (SUTURE) ×2
SUT VIC AB 1 CT1 27XBRD ANBCTR (SUTURE) ×4 IMPLANT
SUT VIC AB 2-0 CT1 27 (SUTURE) ×1
SUT VIC AB 2-0 CT1 TAPERPNT 27 (SUTURE) ×2 IMPLANT
SUT VIC AB 3-0 FS2 27 (SUTURE) IMPLANT
SUT VICRYL 0 UR6 27IN ABS (SUTURE) ×9 IMPLANT
SUTURE FIBERWR 2-0 18 17.9 3/8 (SUTURE) IMPLANT
SYR 20CC LL (SYRINGE) ×6 IMPLANT
SYR 3ML LL SCALE MARK (SYRINGE) ×3 IMPLANT
SYR CONTROL 10ML LL (SYRINGE) IMPLANT
SYR TB 1ML LUER SLIP (SYRINGE) ×3 IMPLANT
SYSTEM CHEST DRAIN TLS 7FR (DRAIN) IMPLANT
TAPE CLOTH SURG 6X10 WHT LF (GAUZE/BANDAGES/DRESSINGS) ×3 IMPLANT
TOWEL OR 17X24 6PK STRL BLUE (TOWEL DISPOSABLE) ×3 IMPLANT
TOWEL OR 17X26 10 PK STRL BLUE (TOWEL DISPOSABLE) ×3 IMPLANT
TUBE CONNECTING 12X1/4 (SUCTIONS) IMPLANT
UNDERPAD 30X30 INCONTINENT (UNDERPADS AND DIAPERS) ×3 IMPLANT
WAND 90 DEG TURBOVAC W/CORD (SURGICAL WAND) ×3 IMPLANT
WATER STERILE IRR 1000ML POUR (IV SOLUTION) ×3 IMPLANT

## 2012-12-22 NOTE — Anesthesia Procedure Notes (Addendum)
Anesthesia Regional Block:  Interscalene brachial plexus block  Pre-Anesthetic Checklist: ,, timeout performed, Correct Patient, Correct Site, Correct Laterality, Correct Procedure, Correct Position, site marked, Risks and benefits discussed,  Surgical consent,  Pre-op evaluation,  At surgeon's request and post-op pain management  Laterality: Right  Prep: chloraprep       Needles:   Needle Type: Other     Needle Length: 9cm  Needle Gauge: 21    Additional Needles:  Procedures: ultrasound guided (picture in chart) Interscalene brachial plexus block Narrative:  Start time: 12/22/2012 10:11 AM End time: 12/22/2012 10:18 AM Injection made incrementally with aspirations every 5 mL.  Performed by: Personally  Anesthesiologist: Aldona Lento, MD  Additional Notes: Ultrasound guidance used to: id relevant anatomy, confirm needle position, local anesthetic spread, avoidance of vascular puncture. Picture saved. No complications. Block performed personally by Janetta Hora. Gelene Mink, MD    Interscalene brachial plexus block Procedure Name: Intubation Date/Time: 12/22/2012 11:39 AM Performed by: Arlice Colt B Pre-anesthesia Checklist: Patient identified, Emergency Drugs available, Suction available, Patient being monitored and Timeout performed Patient Re-evaluated:Patient Re-evaluated prior to inductionOxygen Delivery Method: Circle system utilized Preoxygenation: Pre-oxygenation with 100% oxygen Intubation Type: IV induction Ventilation: Mask ventilation without difficulty Laryngoscope Size: Mac and 4 Grade View: Grade I Tube type: Oral Tube size: 7.0 mm Number of attempts: 1 Airway Equipment and Method: Stylet Placement Confirmation: positive ETCO2,  ETT inserted through vocal cords under direct vision and breath sounds checked- equal and bilateral Secured at: 21 cm Tube secured with: Tape Dental Injury: Teeth and Oropharynx as per pre-operative assessment

## 2012-12-22 NOTE — Anesthesia Preprocedure Evaluation (Addendum)
Anesthesia Evaluation  Patient identified by MRN, date of birth, ID band Patient awake    Reviewed: Allergy & Precautions, H&P , NPO status , Patient's Chart, lab work & pertinent test results, reviewed documented beta blocker date and time   Airway Mallampati: II TM Distance: >3 FB Neck ROM: full    Dental  (+) Teeth Intact   Pulmonary neg pulmonary ROS,  breath sounds clear to auscultation        Cardiovascular negative cardio ROS  Rhythm:regular     Neuro/Psych PSYCHIATRIC DISORDERS  Neuromuscular disease    GI/Hepatic negative GI ROS, Neg liver ROS, GERD-  ,  Endo/Other  diabetes, Oral Hypoglycemic AgentsMorbid obesity  Renal/GU negative Renal ROS  negative genitourinary   Musculoskeletal   Abdominal   Peds  Hematology negative hematology ROS (+)   Anesthesia Other Findings See surgeon's H&P   Reproductive/Obstetrics negative OB ROS                          Anesthesia Physical Anesthesia Plan  ASA: III  Anesthesia Plan: General   Post-op Pain Management:    Induction: Intravenous  Airway Management Planned: Oral ETT  Additional Equipment:   Intra-op Plan:   Post-operative Plan: Extubation in OR  Informed Consent: I have reviewed the patients History and Physical, chart, labs and discussed the procedure including the risks, benefits and alternatives for the proposed anesthesia with the patient or authorized representative who has indicated his/her understanding and acceptance.   Dental Advisory Given  Plan Discussed with: CRNA and Surgeon  Anesthesia Plan Comments:         Anesthesia Quick Evaluation

## 2012-12-22 NOTE — Transfer of Care (Signed)
Immediate Anesthesia Transfer of Care Note  Patient: [name]  Procedure(s) Performed: Procedure(s) with comments: SHOULDER ARTHROSCOPY WITH ROTATOR CUFF REPAIR AND BICEPS TENODESIS REPAIR (Right) - Right Carpal Tunnel Release, Right Shoulder Arthroscopy, Rotator Cuff Tear Repair, Biceps Tenodesis CARPAL TUNNEL RELEASE- RIGHT (Right)  Patient Location: PACU  Anesthesia Type:General  Level of Consciousness: awake, alert  and oriented  Airway & Oxygen Therapy: Patient connected to face mask oxygen  Post-op Assessment: Report given to PACU RN  Post vital signs: stable  Complications: No apparent anesthesia complications

## 2012-12-22 NOTE — Preoperative (Signed)
Beta Blockers   Reason not to administer Beta Blockers:Not Applicable 

## 2012-12-22 NOTE — Brief Op Note (Signed)
12/22/2012  3:13 PM  PATIENT:  [name]  62 y.o. female  PRE-OPERATIVE DIAGNOSIS:  Right CTS, Right Shoulder Rotator Cuff Tear  POST-OPERATIVE DIAGNOSIS:  Right Carpal Tunnel syndrome, Right Shoulder Rotator cuff tear  PROCEDURE:  Procedure(s): SHOULDER ARTHROSCOPY WITH ROTATOR CUFF REPAIR AND BICEPS TENODESIS REPAIR CARPAL TUNNEL RELEASE- RIGHT subacromial decompression  SURGEON:  Surgeon(s): Cammy Copa, MD  ASSISTANT: s vernon pa  ANESTHESIA:   general  EBL: 50 ml    Total I/O In: 1000 [I.V.:1000] Out: 775 [Urine:725; Blood:50]  BLOOD ADMINISTERED: none  DRAINS: none   LOCAL MEDICATIONS USED:  none  SPECIMEN:  No Specimen  COUNTS:  YES  TOURNIQUET:   Total Tourniquet Time Documented: Forearm (Right) - 10 minutes Total: Forearm (Right) - 10 minutes  DICTATION: .Other Dictation: Dictation Number [id number]  PLAN OF CARE: Admit for overnight observation  PATIENT DISPOSITION:  PACU - hemodynamically stable

## 2012-12-22 NOTE — H&P (Signed)
[name] is an 62 y.o. female.   Chief Complaint:  right shoulder and hand pain HPI: [name] is a 62 year old right-hand-dominant female with right shoulder and hand pain. 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 She also describes right hand numbness and tingling. MRI scans consistent with supraspinatus or status rotator cuff tear with some muscular atrophy indicating fairly chronic process she also has carpal tunnel syndrome moderate to moderately severe by EMG nerve study testing. She's failed splinting of the right hand. Presents now for operative management of the shoulder in the hand after expiration risk and benefits  Past Medical History  Diagnosis Date  . GERD (gastroesophageal reflux disease)   . Arthritis   . Diabetes mellitus without complication     DX 2008   USES PILLS  . Depression     ONGOING PROBLEM  . Morton's neuroma of left foot   . Morton's neuroma of left foot     Past Surgical History  Procedure Laterality Date  . Toe surgery      LEFT BIG TOE  . Tubal ligation    . Shoulder arthroscopy w/ rotator cuff repair      LEFT  . Carpal tunnel release      LEFT    History reviewed. No pertinent family history. Social History:  reports that she has never smoked. She has never used smokeless tobacco. She reports that she does not drink alcohol or use illicit drugs.  Allergies:  Allergies  Allergen Reactions  . Penicillins Other (See Comments)    Reaction unknown  . Shrimp (Shellfish Allergy) Itching  . Sulfa Antibiotics Other (See Comments)    Reaction unknown    Medications Prior to Admission  Medication Sig Dispense Refill  . aspirin EC 81 MG tablet Take 81 mg by mouth daily.      . diphenhydrAMINE (BENADRYL) 2 % cream Apply 1 application topically 3 (three) times daily as needed for itching.      . levothyroxine (SYNTHROID, LEVOTHROID) 75 MCG tablet Take 75 mcg by  mouth daily before breakfast.      . meloxicam (MOBIC) 7.5 MG tablet Take 7.5 mg by mouth daily.      . metFORMIN (GLUCOPHAGE) 500 MG tablet Take 500 mg by mouth daily with breakfast.      . Polyethyl Glycol-Propyl Glycol (SYSTANE ULTRA) 0.4-0.3 % SOLN Apply 2 drops to eye 2 (two) times daily.      . sertraline (ZOLOFT) 100 MG tablet Take 100 mg by mouth daily.        Results for orders placed during the hospital encounter of 12/22/12 (from the past 48 hour(s))  GLUCOSE, CAPILLARY     Status: Abnormal   Collection Time    12/22/12  9:33 AM      Result Value Range   Glucose-Capillary 125 (*) 70 - 99 mg/dL   No results found.  Review of Systems  Constitutional: Negative.   HENT: Negative.   Eyes: Negative.   Respiratory: Negative.   Cardiovascular: Negative.   Gastrointestinal: Negative.   Genitourinary: Negative.   Musculoskeletal: Positive for joint pain.  Skin: Negative.   Neurological: Negative.   Endo/Heme/Allergies: Negative.   Psychiatric/Behavioral: Negative.     Blood pressure 126/61, pulse 56, temperature 97.4 F (36.3 C), temperature source Oral, resp. rate 11, SpO2 98.00%. Physical Exam  Constitutional: She appears well-developed.  HENT:  Head: Normocephalic.  Eyes: Pupils are equal, round,  and reactive to light.  Neck: Normal range of motion.  Cardiovascular: Normal rate.   Respiratory: Effort normal.  GI: Soft.  Neurological: She is alert.  Skin: Skin is warm.  Psychiatric: She has a normal mood and affect.   examination the right shoulder demonstrates weakness to supraspinatus and her status testing never stricture extra rotation 15 abduction 50 sounds positive no a.c. joint tenderness right palpation positive carpal tunnel compression testing on the right palpable radial pulse on the right minimal to mild abductor pollicis brevis wasting negative Tinel's cubital tunnel the right-hand side skin intact in the shoulder and in  region  Assessment/Plan Impression is right shoulder rotator cuff tear with retraction and some atrophy. Plan is for arthroscopy evaluation of the biceps tendon possible biceps tenodesis and attempt at rotator cuff tear repair. Risk and benefits are discussed with the patient due to limited to infection nerve vessel damage potential that this may not be a repairable rotator cuff tear. Patient understands risk and benefits and wished to proceed to surgery notably she has had left shoulder rotator cuff surgery and she did well with that however this was after a more acute injury. Plan is also for right carpal, release. She's failed conservative management and recently had left carpal tunnel release performed she doing recently well with that and is functional at the left-hand. Patient wishes risk and benefits of the carpal tunnel surgically we'll and to infection nerve vessel damage incomplete pain relief. All questions answered  DEAN,GREGORY SCOTT 12/22/2012, 11:07 AM

## 2012-12-23 LAB — GLUCOSE, CAPILLARY: Glucose-Capillary: 148 mg/dL — ABNORMAL HIGH (ref 70–99)

## 2012-12-23 MED ORDER — OXYCODONE HCL 5 MG PO TABS: 5.0000 mg | ORAL_TABLET | ORAL | Status: DC | PRN

## 2012-12-23 NOTE — Anesthesia Postprocedure Evaluation (Signed)
Anesthesia Post Note  Patient: [name]  Procedure(s) Performed: Procedure(s) (LRB): SHOULDER ARTHROSCOPY WITH ROTATOR CUFF REPAIR AND BICEPS TENODESIS REPAIR (Right) CARPAL TUNNEL RELEASE- RIGHT (Right)  Anesthesia type: General  Patient location: PACU  Post pain: Pain level controlled  Post assessment: Patient's Cardiovascular Status Stable  Last Vitals:  Filed Vitals:   12/23/12 0503  BP: 145/70  Pulse: 57  Temp: 36.6 C  Resp: 14    Post vital signs: Reviewed and stable  Level of consciousness: alert  Complications: No apparent anesthesia complications

## 2012-12-23 NOTE — Progress Notes (Signed)
Inpatient Diabetes Program Recommendations  AACE/ADA: New Consensus Statement on Inpatient Glycemic Control (2013)  Target Ranges:  Prepandial:   less than 140 mg/dL      Peak postprandial:   less than 180 mg/dL (1-2 hours)      Critically ill patients:  140 - 180 mg/dL   Reason for Visit:  Results for [name], [name] (MRN [id number]) as of 12/23/2012 10:30  Ref. Range 12/22/2012 16:23 12/22/2012 21:18 12/23/2012 06:24  Glucose-Capillary Latest Range: 70-99 mg/dL 409 (H) 811 (H) 914 (H)   Note history of diabetes.  Please start "Glycemic Control Order set"-Novolog moderate tid with meals and HS scale.  Also please change diet to CHO modified.

## 2012-12-23 NOTE — Progress Notes (Signed)
Subjective: Pt stable - moving well   Objective: Vital signs in last 24 hours: Temp:  [97.6 F (36.4 C)-98.7 F (37.1 C)] 97.9 F (36.6 C) (07/30 0503) Pulse Rate:  [52-85] 57 (07/30 0503) Resp:  [14-19] 14 (07/30 0503) BP: (124-163)/(63-78) 145/70 mmHg (07/30 0503) SpO2:  [90 %-100 %] 100 % (07/30 0503)  Intake/Output from previous day: 07/29 0701 - 07/30 0700 In: 2840 [P.O.:640; I.V.:2200] Out: 2250 [Urine:2200; Blood:50] Intake/Output this shift:    Exam:  Neurovascular intact Sensation intact distally Intact pulses distally  Labs: No results found for this basename: HGB,  in the last 72 hours No results found for this basename: WBC, RBC, HCT, PLT,  in the last 72 hours No results found for this basename: NA, K, CL, CO2, BUN, CREATININE, GLUCOSE, CALCIUM,  in the last 72 hours No results found for this basename: LABPT, INR,  in the last 72 hours  Assessment/Plan: Plan dc today - ot in progress   DEAN,GREGORY SCOTT 12/23/2012, 12:14 PM

## 2012-12-23 NOTE — Evaluation (Signed)
Occupational Therapy Evaluation Patient Details Name: [name] MRN: [id number] DOB: May 18, 1951 Today's Date: 12/23/2012 Time: 4098-1191 OT Time Calculation (min): 55 min  OT Assessment / Plan / Recommendation History of present illness 62 y.o. s/p Rt shoulder arthroscopy with rotator cuff repair, biceps tenodesis repair, and carpal tunnel release.   Clinical Impression   Pt presents with above. Pt moving well during session. OT provided shoulder education and reviewed handout. Practiced exercises. Feel pt is safe to d/c home with sister available to assist 24/7.    OT Assessment  Patient does not need any further OT services    Follow Up Recommendations  No OT follow up;Supervision/Assistance - 24 hour    Barriers to Discharge      Equipment Recommendations  None recommended by OT    Recommendations for Other Services    Frequency       Precautions / Restrictions Precautions Precautions: Shoulder Type of Shoulder Precautions: ADL, Sling Education Shoulder Interventions: Shoulder sling/immobilizer;Off for dressing/bathing/exercises Precaution Booklet Issued: Yes (comment) Required Braces or Orthoses: Sling;Other Brace/Splint (splint on Rt wrist) Restrictions Weight Bearing Restrictions: Yes   Pertinent Vitals/Pain Pain 3/10. Repositioned.     ADL  Toilet Transfer: Buyer, retail Method: Sit to Barista: Regular height toilet Equipment Used: Other (comment) (shoulder sling, wrist splint) Transfers/Ambulation Related to ADLs: Supervision level ADL Comments: Performed PROM exercises and pendulums as well as AROM of elbow and fingers. OT reviewed shoulder handout with pt. Practiced simulated regular height toilet transfer. Also, practiced bed mobility.     OT Diagnosis:    OT Problem List:   OT Treatment Interventions:     OT Goals(Current goals can be found in the care plan section)    Visit Information  Last OT Received On: 12/23/12 Assistance Needed: +1 History of Present Illness: 62 y.o. s/p Rt shoulder arthroscopy with rotator cuff repair, biceps tenodesis repair, and carpal tunnel release.       Prior Functioning     Home Living Family/patient expects to be discharged to:: Private residence Living Arrangements: Alone Available Help at Discharge: Family;Available 24 hours/day Type of Home: House Home Access: Ramped entrance (1 step to get into bathroom) Home Layout: One level Home Equipment: Bedside commode Prior Function Level of Independence: Independent Communication Communication: No difficulties Dominant Hand: Left         Vision/Perception     Cognition  Cognition Arousal/Alertness: Awake/alert Behavior During Therapy: WFL for tasks assessed/performed Overall Cognitive Status: Within Functional Limits for tasks assessed    Extremity/Trunk Assessment Upper Extremity Assessment Upper Extremity Assessment: RUE deficits/detail RUE Deficits / Details: Rt shoulder arthroscopy with rotator cuff repair, biceps tenodesis repair, and carpal tunnel release. RUE Sensation: decreased light touch (numbness in Rt thumb and 2nd digit.)     Mobility Bed Mobility Bed Mobility: Sit to Supine;Supine to Sit Supine to Sit: 5: Supervision Sit to Supine: 5: Supervision Transfers Transfers: Sit to Stand;Stand to Sit Sit to Stand: 5: Supervision;From chair/3-in-1;From toilet Stand to Sit: 5: Supervision;To chair/3-in-1;To toilet;To bed     Exercise Shoulder Exercises Pendulum Exercise: PROM;Right;10 reps;Standing Shoulder Flexion: PROM;Right;5 reps;Seated Shoulder Extension: PROM;Right;5 reps;Seated Shoulder ABduction: PROM;Right;5 reps;Seated Shoulder External Rotation: PROM;Right;5 reps;Seated Elbow Flexion: AROM;Right;10 reps;Standing Elbow Extension: AROM;Right;10 reps;Standing Digit Composite Flexion: AROM;Right;10 reps;Standing Composite Extension: AROM;Right;10  reps;Standing Neck Flexion: AROM;10 reps;Standing Neck Extension: AROM;10 reps;Standing Neck Lateral Flexion - Right: AROM;10 reps;Standing Neck Lateral Flexion - Left: AROM;10 reps;Standing Donning/doffing shirt without moving shoulder: Patient able to independently direct caregiver  Method for sponge bathing under operated UE: Patient able to independently direct caregiver Donning/doffing sling/immobilizer: Patient able to independently direct caregiver Correct positioning of sling/immobilizer: Patient able to independently direct caregiver Pendulum exercises (written home exercise program): Minimal assistance ROM for elbow, wrist and digits of operated UE: Supervision/safety Sling wearing schedule (on at all times/off for ADL's): Patient able to independently direct caregiver Proper positioning of operated UE when showering: Patient able to independently direct caregiver Positioning of UE while sleeping: Patient able to independently direct caregiver   Balance     End of Session OT - End of Session Equipment Utilized During Treatment: Other (comment) (shoulder sling, wrist splint) Activity Tolerance: Patient tolerated treatment well Patient left: in chair;with call bell/phone within reach Nurse Communication: Mobility status  GO Functional Assessment Tool Used: clinical judgment Functional Limitation: Self care Self Care Current Status (B1478): At least 1 percent but less than 20 percent impaired, limited or restricted Self Care Goal Status (G9562): At least 1 percent but less than 20 percent impaired, limited or restricted Self Care Discharge Status [phone]): At least 1 percent but less than 20 percent impaired, limited or restricted   Earlie Raveling OTR/L [phone] 12/23/2012, 2:34 PM

## 2012-12-23 NOTE — Op Note (Signed)
NAME:  [name], [name]              ACCOUNT NO.:  [id number]  MEDICAL RECORD NO.:  [id number]  LOCATION:  5N11C                        FACILITY:  [facility]  PHYSICIAN:  Burnard Bunting, M.D.    DATE OF BIRTH:  1951-02-05  DATE OF PROCEDURE: DATE OF DISCHARGE:                              OPERATIVE REPORT   PREOPERATIVE DIAGNOSES:  Right shoulder rotator cuff tear, bursitis, biceps tendinitis, and carpal tunnel syndrome.  POSTOPERATIVE DIAGNOSES:  Massive right shoulder rotator cuff tear, supraspinatus and infraspinatus with biceps tendinitis, early adhesive capsulitis within the rotator interval and synovitis within the rotator interval, and carpal tunnel syndrome.  PROCEDURE: 1. Shoulder arthroscopy with debridement of rotator interval     synovitis. 2. Right shoulder biceps tendon release and extensive debridement of     the labrum. 3. Open partial rotator cuff repair of the supraspinatus and     infraspinatus. 4. Open biceps tenodesis. 5. Open carpal tunnel release.  SURGEON:  Burnard Bunting, M.D.  ASSISTANT:  Maud Deed.  ANESTHESIA:  General tracheal.  ESTIMATED BLOOD LOSS:  50 mL.  TOURNIQUET:  10 minutes on the forearm for the carpal tunnel release at 250 mmHg.  INDICATION:  Ruthell Feigenbaum is a 62 year old female with right shoulder pain presents for operative management after explanation of risks and benefits.  MRI scan shows supraspinatus and infraspinatus tear with muscular atrophy and retraction.  EMG nerve study is positive for moderate to severe carpal tunnel syndrome.  PROCEDURE IN DETAIL:  The patient was brought to the operating room, where general endotracheal anesthesia was introduced.  Preoperative antibiotics were induced.  Time-out was called.  The patient was placed in a beachchair position with the head in neutral position.  Right arm was prescrubbed with alcohol, Betadine, which allowed to air dry, prepped with DuraPrep solution, draped in a  sterile manner.  Collier Flowers was used to cover the operative field.  Right arm was examined under anesthesia, and after full forward flexion; external rotation 50 degrees, abduction to about 60 degrees; internal rotation, abduction to about 90.  At this time, time-out was called.  Scope was placed in the posterior portal.  2 cm medial inferior to the posterolateral margin of the acromion, diagnostic arthroscopy was performed.  The anterior portal created under direct visualization.  Synovitis present within the rotator interval.  This was coagulated with the ArthroCare wand and debrided.  The biceps tendon did have some tendonitis in labral fraying. The cuff was mobilized arthroscopically and later open.  Tissue quality was marginal.  At this time, a selective watertight repair was not going to be possible, therefore because of the pre-existing labral fraying and degeneration at the biceps anchor and the biceps tendonitis present, biceps tendon was released.  Extensive labral debridement performed. Glenohumeral articular surfaces were intact.  At this time, the instruments were removed from the anterior and posterior portals. Lateral portal was also created to judge the mobility of the rotator cuff tendon after releases including a rotator interval slide performed arthroscopically.  At this time, the anterolateral incision was made off the internal margin of the acromion.  Skin and subcu tissue sharply divided.  Deltoid split was made in the raphe  between the anterior and medial deltoid, stay suture was placed.  #1 Vicryl suture measured distance 4.5 cm from the anterolateral margin acromion.  Deltoid split performed.  The patient did have significant rotator cuff tear. Acromioplasty performed without release of the CA ligament.  Rotator cuff was then mobilized.  Biceps tenodesis was performed with Arthrex Bio interference screw a 7 x 23 mm.  Rotator cuff was then mobilized 1st by placing a 0  Vicryl suture in the edges.  Trial was then used to mobilize as much as possible.  In general, about 80% head coverage was achievable.  With the head coverage in position, with head coverage was obtained, two 5.5 corkscrew anchors were placed, 1 for the supraspinatus, 1 for the infraspinatus.  The suture was mobilized in rotator cuff.  The 0 Vicryl sutures were then passed through the opposing leaflet of the rotator cuff tendon to achieve about 80% head coverage.  Four sutures were tied in mattress fashion through the infraspinatus, 4 through the supraspinatus.  These 2 sutures were then anchored with a push lock anchors just below the rotator cuff attachment site.  Reasonable bone quality was present.  This did give an improvement in her rotator cuff appearance with a reestablishment of some of the footprints.  At this time, thorough irrigation was performed.  Deltoid tendon had to be released about less than a centimeter anteriorly and laterally.  This was repaired with a drill hole through the acromion and #2 FiberWire suture.  Deltoid split was then reapproximated after thorough irrigation using 0 Vicryl suture followed by interrupted inverted 2-0 Vicryl suture and a running 3-0 pullout Prolene.  At this time, attention was directed towards the hand. The hand was re-prepped after initial prepping it with DuraPrep.  Form tourniquet was used for 10 minutes.  Incision was made at the intersection of Kaplan's cardinal line in the radial border of the 4th finger down to the proximal wrist flexion crease.  Skin and subcu tissue sharply divided.  Fascia was divided.  Palmer fascia was divided.  The transverse carpal ligament was identified and divided 1-2 mm along its middle portion.  Right angle retractor was placed and then the transcarpal ligament was divided distally and proximally under direct visualization.  Thorough irrigation was then performed.  Tourniquet was released.  Bleeding  points controlled with bipolar electrocautery.  A 3- 0 nylon sutures and a bulky dressing hand splint applied.  The patient tolerated the procedure well without immediate complications. Transferred to recovery room in stable condition after placement of an arm sling.  Velna Hatchet Vernon's assistance was required for the rotator cuff portion of the case.  Her assistance was a medical necessity for retraction, positioning, tunneled drilling, cuff mobilization.  Her assistance was a medical necessity for the rotator cuff portion of the case.     Burnard Bunting, M.D.     GSD/MEDQ  D:  12/22/2012  T:  12/23/2012  Job:  [id number]

## 2012-12-25 ENCOUNTER — Encounter (HOSPITAL_COMMUNITY): Payer: Self-pay | Admitting: Orthopedic Surgery

## 2013-01-11 ENCOUNTER — Ambulatory Visit: Payer: BC Managed Care – PPO | Attending: Family Medicine | Admitting: Physical Therapy

## 2013-01-11 DIAGNOSIS — IMO0001 Reserved for inherently not codable concepts without codable children: Secondary | ICD-10-CM | POA: Insufficient documentation

## 2013-01-11 DIAGNOSIS — R5381 Other malaise: Secondary | ICD-10-CM | POA: Insufficient documentation

## 2013-01-11 DIAGNOSIS — M25519 Pain in unspecified shoulder: Secondary | ICD-10-CM | POA: Insufficient documentation

## 2013-01-11 DIAGNOSIS — M25619 Stiffness of unspecified shoulder, not elsewhere classified: Secondary | ICD-10-CM | POA: Insufficient documentation

## 2013-01-11 NOTE — Discharge Summary (Signed)
Physician Discharge Summary  Patient ID: [name] MRN: [id number] DOB/AGE: Dec 13, 1950 62 y.o.  Admit date: 12/22/2012 Discharge date:12/23/2012 Admission Diagnoses:  Rotator cuff tear and CTS  Discharge Diagnoses:  Same  Surgeries: Procedure(s): SHOULDER ARTHROSCOPY WITH ROTATOR CUFF REPAIR AND BICEPS TENODESIS REPAIR CARPAL TUNNEL RELEASE- RIGHT on 12/22/2012   Consultants:    Discharged Condition: Stable  Hospital Course: [name] is an 62 y.o. female who was admitted 12/22/2012 with a chief complaint of right upper extremity pain, and found to have a diagnosis of rotator cuff tear and carpal tunnel syndrome.  They were brought to the operating room on 12/22/2012 and underwent the above named procedures. She tolerated both well and worked  With PT at the time of discharge.   Antibiotics given:  Anti-infectives   Start     Dose/Rate Route Frequency Ordered Stop   12/22/12 1700  clindamycin (CLEOCIN) IVPB 600 mg     600 mg 100 mL/hr over 30 Minutes Intravenous Every 6 hours 12/22/12 1620 12/23/12 0652   12/22/12 1200  clindamycin (CLEOCIN) IVPB 900 mg  Status:  Discontinued     900 mg 100 mL/hr over 30 Minutes Intravenous To Surgery 12/22/12 1147 12/22/12 1614    .  Recent vital signs:  Filed Vitals:   12/23/12 0503  BP: 145/70  Pulse: 57  Temp: 97.9 F (36.6 C)  Resp: 14    Recent laboratory studies:  Results for orders placed during the hospital encounter of 12/22/12  GLUCOSE, CAPILLARY      Result Value Range   Glucose-Capillary 125 (*) 70 - 99 mg/dL  GLUCOSE, CAPILLARY      Result Value Range   Glucose-Capillary 199 (*) 70 - 99 mg/dL   Comment 1 Notify RN     Comment 2 Documented in Chart    GLUCOSE, CAPILLARY      Result Value Range   Glucose-Capillary 224 (*) 70 - 99 mg/dL  GLUCOSE, CAPILLARY      Result Value Range   Glucose-Capillary 147 (*) 70 - 99 mg/dL  GLUCOSE, CAPILLARY      Result Value Range   Glucose-Capillary 148 (*) 70 - 99  mg/dL   Comment 1 Notify RN     Comment 2 Documented in Chart      Discharge Medications:     Medication List         aspirin EC 81 MG tablet  Take 81 mg by mouth daily.     diphenhydrAMINE 2 % cream  Commonly known as:  BENADRYL  Apply 1 application topically 3 (three) times daily as needed for itching.     levothyroxine 75 MCG tablet  Commonly known as:  SYNTHROID, LEVOTHROID  Take 75 mcg by mouth daily before breakfast.     meloxicam 7.5 MG tablet  Commonly known as:  MOBIC  Take 7.5 mg by mouth daily.     metFORMIN 500 MG tablet  Commonly known as:  GLUCOPHAGE  Take 500 mg by mouth daily with breakfast.     oxyCODONE 5 MG immediate release tablet  Commonly known as:  Oxy IR/ROXICODONE  Take 1-2 tablets (5-10 mg total) by mouth every 3 (three) hours as needed.     sertraline 100 MG tablet  Commonly known as:  ZOLOFT  Take 100 mg by mouth daily.     SYSTANE ULTRA 0.4-0.3 % Soln  Generic drug:  Polyethyl Glycol-Propyl Glycol  Apply 2 drops to eye 2 (two) times daily.  Diagnostic Studies: No results found.  Disposition: 01-Home or Self Care      Discharge Orders   Future Appointments Provider Department Dept Phone   01/11/2013 3:15 PM [place], PT [facility] [phone]   Future Orders Complete By Expires   Apply dressing  As directed    Scheduling Instructions:     mepilex   Call MD / Call 911  As directed    Comments:     If you experience chest pain or shortness of breath, CALL 911 and be transported to the hospital emergency room.  If you develope a fever above 101 F, pus (white drainage) or increased drainage or redness at the wound, or calf pain, call your surgeon's office.   Constipation Prevention  As directed    Comments:     Drink plenty of fluids.  Prune juice may be helpful.  You may use a stool softener, such as Colace (over the counter) 100 mg twice a day.  Use MiraLax (over the counter) for  constipation as needed.   Diet - low sodium heart healthy  As directed    Discharge instructions  As directed    Comments:     Keep incisions dry Elevate hand CPM for shoulder 1 hour 3 times a day increase as tolerated   Increase activity slowly as tolerated  As directed          Signed: Jaeveon Ashland SCOTT 01/11/2013, 2:15 PM

## 2013-01-13 ENCOUNTER — Ambulatory Visit: Payer: BC Managed Care – PPO | Admitting: Physical Therapy

## 2013-01-19 ENCOUNTER — Ambulatory Visit: Payer: BC Managed Care – PPO

## 2013-01-21 ENCOUNTER — Ambulatory Visit: Payer: BC Managed Care – PPO | Admitting: Physical Therapy

## 2013-01-27 ENCOUNTER — Ambulatory Visit: Payer: BC Managed Care – PPO | Attending: Family Medicine

## 2013-01-27 DIAGNOSIS — IMO0001 Reserved for inherently not codable concepts without codable children: Secondary | ICD-10-CM | POA: Insufficient documentation

## 2013-01-27 DIAGNOSIS — M25619 Stiffness of unspecified shoulder, not elsewhere classified: Secondary | ICD-10-CM | POA: Insufficient documentation

## 2013-01-27 DIAGNOSIS — R5381 Other malaise: Secondary | ICD-10-CM | POA: Insufficient documentation

## 2013-01-27 DIAGNOSIS — M25519 Pain in unspecified shoulder: Secondary | ICD-10-CM | POA: Insufficient documentation

## 2013-02-01 ENCOUNTER — Ambulatory Visit: Payer: BC Managed Care – PPO | Admitting: Physical Therapy

## 2013-02-04 ENCOUNTER — Ambulatory Visit: Payer: BC Managed Care – PPO | Admitting: Physical Therapy

## 2013-02-08 ENCOUNTER — Ambulatory Visit: Payer: BC Managed Care – PPO | Admitting: Physical Therapy

## 2013-02-10 ENCOUNTER — Ambulatory Visit: Payer: BC Managed Care – PPO | Admitting: Physical Therapy

## 2013-02-16 ENCOUNTER — Ambulatory Visit: Payer: BC Managed Care – PPO | Admitting: *Deleted

## 2013-02-18 ENCOUNTER — Ambulatory Visit: Payer: BC Managed Care – PPO | Admitting: Physical Therapy

## 2013-02-23 ENCOUNTER — Ambulatory Visit: Payer: BC Managed Care – PPO | Admitting: *Deleted

## 2013-02-25 ENCOUNTER — Ambulatory Visit: Payer: BC Managed Care – PPO | Attending: Family Medicine | Admitting: Physical Therapy

## 2013-02-25 DIAGNOSIS — M25519 Pain in unspecified shoulder: Secondary | ICD-10-CM | POA: Insufficient documentation

## 2013-02-25 DIAGNOSIS — R5381 Other malaise: Secondary | ICD-10-CM | POA: Insufficient documentation

## 2013-02-25 DIAGNOSIS — M25619 Stiffness of unspecified shoulder, not elsewhere classified: Secondary | ICD-10-CM | POA: Insufficient documentation

## 2013-02-25 DIAGNOSIS — IMO0001 Reserved for inherently not codable concepts without codable children: Secondary | ICD-10-CM | POA: Insufficient documentation

## 2013-03-01 ENCOUNTER — Ambulatory Visit: Payer: BC Managed Care – PPO

## 2013-03-04 ENCOUNTER — Ambulatory Visit: Payer: BC Managed Care – PPO | Admitting: Physical Therapy

## 2013-03-09 ENCOUNTER — Ambulatory Visit: Payer: BC Managed Care – PPO

## 2013-03-12 ENCOUNTER — Ambulatory Visit: Payer: BC Managed Care – PPO | Admitting: Physical Therapy

## 2013-03-15 ENCOUNTER — Ambulatory Visit: Payer: BC Managed Care – PPO

## 2013-03-19 ENCOUNTER — Ambulatory Visit: Payer: BC Managed Care – PPO | Admitting: *Deleted

## 2013-03-24 ENCOUNTER — Ambulatory Visit: Payer: BC Managed Care – PPO

## 2013-03-26 ENCOUNTER — Ambulatory Visit: Payer: BC Managed Care – PPO | Admitting: Physical Therapy

## 2013-03-29 ENCOUNTER — Ambulatory Visit: Payer: BC Managed Care – PPO | Attending: Family Medicine

## 2013-03-29 DIAGNOSIS — M25619 Stiffness of unspecified shoulder, not elsewhere classified: Secondary | ICD-10-CM | POA: Insufficient documentation

## 2013-03-29 DIAGNOSIS — IMO0001 Reserved for inherently not codable concepts without codable children: Secondary | ICD-10-CM | POA: Insufficient documentation

## 2013-03-29 DIAGNOSIS — M25519 Pain in unspecified shoulder: Secondary | ICD-10-CM | POA: Insufficient documentation

## 2013-03-29 DIAGNOSIS — R5381 Other malaise: Secondary | ICD-10-CM | POA: Insufficient documentation

## 2013-04-01 ENCOUNTER — Ambulatory Visit: Payer: BC Managed Care – PPO

## 2013-05-06 ENCOUNTER — Ambulatory Visit: Payer: BC Managed Care – PPO | Attending: Orthopedic Surgery | Admitting: Physical Therapy

## 2013-05-06 DIAGNOSIS — R5381 Other malaise: Secondary | ICD-10-CM | POA: Insufficient documentation

## 2013-05-06 DIAGNOSIS — M25519 Pain in unspecified shoulder: Secondary | ICD-10-CM | POA: Insufficient documentation

## 2013-05-06 DIAGNOSIS — M25619 Stiffness of unspecified shoulder, not elsewhere classified: Secondary | ICD-10-CM | POA: Insufficient documentation

## 2013-05-06 DIAGNOSIS — I10 Essential (primary) hypertension: Secondary | ICD-10-CM | POA: Insufficient documentation

## 2013-05-06 DIAGNOSIS — IMO0001 Reserved for inherently not codable concepts without codable children: Secondary | ICD-10-CM | POA: Insufficient documentation

## 2013-05-06 DIAGNOSIS — E119 Type 2 diabetes mellitus without complications: Secondary | ICD-10-CM | POA: Insufficient documentation

## 2013-05-10 ENCOUNTER — Ambulatory Visit: Payer: BC Managed Care – PPO | Admitting: Physical Therapy

## 2013-05-12 ENCOUNTER — Ambulatory Visit: Payer: BC Managed Care – PPO | Admitting: Physical Therapy

## 2013-05-14 ENCOUNTER — Ambulatory Visit: Payer: BC Managed Care – PPO | Admitting: Physical Therapy

## 2015-06-27 DIAGNOSIS — R413 Other amnesia: Secondary | ICD-10-CM | POA: Diagnosis not present

## 2015-06-27 DIAGNOSIS — H9319 Tinnitus, unspecified ear: Secondary | ICD-10-CM | POA: Diagnosis not present

## 2015-06-27 DIAGNOSIS — R4701 Aphasia: Secondary | ICD-10-CM | POA: Diagnosis not present

## 2015-06-27 DIAGNOSIS — R42 Dizziness and giddiness: Secondary | ICD-10-CM | POA: Diagnosis not present

## 2015-06-27 DIAGNOSIS — E039 Hypothyroidism, unspecified: Secondary | ICD-10-CM | POA: Diagnosis not present

## 2015-06-27 DIAGNOSIS — E139 Other specified diabetes mellitus without complications: Secondary | ICD-10-CM | POA: Diagnosis not present

## 2015-06-27 DIAGNOSIS — I10 Essential (primary) hypertension: Secondary | ICD-10-CM | POA: Diagnosis not present

## 2015-06-29 ENCOUNTER — Ambulatory Visit (INDEPENDENT_AMBULATORY_CARE_PROVIDER_SITE_OTHER): Payer: Medicare HMO | Admitting: Neurology

## 2015-06-29 ENCOUNTER — Encounter: Payer: Self-pay | Admitting: Neurology

## 2015-06-29 VITALS — BP 141/74 | HR 66 | Ht 58.5 in | Wt 236.0 lb

## 2015-06-29 DIAGNOSIS — R42 Dizziness and giddiness: Secondary | ICD-10-CM

## 2015-06-29 HISTORY — DX: Dizziness and giddiness: R42

## 2015-06-29 NOTE — Patient Instructions (Signed)

## 2015-06-29 NOTE — Progress Notes (Signed)
Reason for visit: Dizziness  Referring physician: Dr. Particia Nearing  [name] is a 65 y.o. 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 She was seen about 14 years ago by Dr. Silvestre Moment from ENT for this issue. MRI of the brain at that time was relatively unremarkable. She has had ongoing changes in hearing, unassociated with ear pain. About 3-4 months ago she began having some episodes of dizziness that she describes as a lightheaded feeling, not true vertigo. Occasionally, she may have more of a vertiginous sensation when she lies down in bed. The episodes of dizziness during the day are more first thing in the morning, and then later in the evening. She indicates that the episodes may occur when she stands up or stoops over, or moves her head quickly. She will have some dizziness lasting 15 seconds or so, and then going away. She denies any nausea or vomiting. She has not had any double vision or loss of vision. She reports no numbness or weakness of the face, arms, or legs. The patient indicates that she was placed on lisinopril recently, she is not clear whether this medication was started around the time that the dizziness began. She has had no double vision or loss of vision, but she does report that she has had some headaches that began around the time of the dizziness that may occur on average twice a week. The patient has not had syncopal events. She comes to this office for further evaluation. She has not had any falls because of dizziness.  Past Medical History  Diagnosis Date  . GERD (gastroesophageal reflux disease)   . Arthritis   . Diabetes mellitus without complication ([place])     DX 2008   USES PILLS  . Depression     ONGOING PROBLEM  . Morton's neuroma of left foot   . Morbidly obese  ([place])   . Hypothyroidism   . Hypertension   . Dizziness and giddiness 06/29/2015    Past Surgical History  Procedure Laterality Date  . Toe surgery      LEFT BIG TOE  . Tubal ligation    . Shoulder arthroscopy w/ rotator cuff repair      LEFT  . Carpal tunnel release      LEFT  . Shoulder arthroscopy with rotator cuff repair Right 12/22/2012    Procedure: SHOULDER ARTHROSCOPY WITH ROTATOR CUFF REPAIR AND BICEPS TENODESIS REPAIR;  Surgeon: Meredith Pel, MD;  Location: [facility];  Service: Orthopedics;  Laterality: Right;  Right Carpal Tunnel Release, Right Shoulder Arthroscopy, Rotator Cuff Tear Repair, Biceps Tenodesis  . Carpal tunnel release Right 12/22/2012    Procedure: CARPAL TUNNEL RELEASE- RIGHT;  Surgeon: Meredith Pel, MD;  Location: [facility];  Service: Orthopedics;  Laterality: Right;    Family History  Problem Relation Age of Onset  . Heart attack Mother   . Cancer Father     brain  . Heart failure Father   . Cancer Brother     Prostate  . Leukemia Brother   . Dementia Sister     alzheimers    Social history:  reports that she has never smoked. She has never used smokeless tobacco. She reports that she does not drink alcohol or use illicit drugs.  Medications:  Prior to Admission medications   Medication Sig Start Date End Date Taking? Authorizing Provider  aspirin EC 81 MG tablet Take 81 mg by mouth daily.   Yes Historical Provider, MD  diphenhydrAMINE (BENADRYL) 2 % cream Apply 1 application topically 3 (three) times daily as needed for itching.   Yes Historical Provider, MD  levothyroxine (SYNTHROID, LEVOTHROID) 100 MCG tablet Take 100 mcg by mouth daily before breakfast.   Yes Historical Provider, MD  lisinopril (PRINIVIL,ZESTRIL) 10 MG tablet Take 10 mg by mouth daily.   Yes Historical Provider, MD  meloxicam (MOBIC) 7.5 MG tablet Take 7.5 mg by mouth daily.   Yes Historical Provider, MD  metFORMIN (GLUCOPHAGE) 1000 MG tablet Take 1,000 mg by mouth 2 (two)  times daily with a meal.   Yes Historical Provider, MD  oxyCODONE (OXY IR/ROXICODONE) 5 MG immediate release tablet Take 1-2 tablets (5-10 mg total) by mouth every 3 (three) hours as needed. 12/23/12  Yes Scott Marcene Duos, MD  Polyethyl Glycol-Propyl Glycol (SYSTANE ULTRA) 0.4-0.3 % SOLN Apply 2 drops to eye 2 (two) times daily.   Yes Historical Provider, MD  sertraline (ZOLOFT) 100 MG tablet Take 100 mg by mouth daily.   Yes Historical Provider, MD      Allergies  Allergen Reactions  . Penicillins Other (See Comments)    Reaction unknown  . Shrimp [Shellfish Allergy] Itching  . Sulfa Antibiotics Other (See Comments)    Reaction unknown    ROS:  Out of a complete 14 system review of symptoms, the patient complains only of the following symptoms, and all other reviewed systems are negative.  Ringing in the ears Birthmarks Snoring Urinary incontinence Muscle cramps Confusion, headache, dizziness Depression, change in appetite, disinterest in activities  Blood pressure 141/74, pulse 66, height 4' 10.5" (1.486 m), weight 236 lb (107.049 kg).   Blood pressure, right arm, standing is 152/90. Blood pressure, sitting, right arm is 148/80.  Physical Exam  General: The patient is alert and cooperative at the time of the examination. The patient is markedly obese.  Eyes: Pupils are equal, round, and reactive to light. Discs are flat bilaterally.  Ears: Tympanic membranes are clear bilaterally.  Neck: The neck is supple, no carotid bruits are noted.  Respiratory: The respiratory examination is clear.  Cardiovascular: The cardiovascular examination reveals a regular rate and rhythm, no obvious murmurs or rubs are noted.  Skin: Extremities are with 1+ edema in the legs below the knees.  Neurologic Exam  Mental status: The patient is alert and oriented x 3 at the time of the examination. The patient has apparent normal recent and remote memory, with an apparently normal attention  span and concentration ability.  Cranial nerves: Facial symmetry is present. There is good sensation of the face to pinprick and soft touch bilaterally. The strength of the facial muscles and the muscles to head turning and shoulder shrug are normal bilaterally. Speech is well enunciated, no aphasia or dysarthria is noted. Extraocular movements are full. Visual fields are full. The tongue is midline, and the patient has symmetric elevation of the soft palate. No obvious hearing deficits are noted.  Motor: The motor testing reveals 5 over 5 strength of all 4 extremities. Good symmetric motor tone is noted throughout.  Sensory: Sensory testing is intact to pinprick, soft touch, vibration sensation, and position sense on all 4 extremities. No evidence of extinction is noted.  Coordination: Cerebellar testing reveals good finger-nose-finger and heel-to-shin bilaterally. The Nyan-Barrany procedure was  negative, the patient reported minimal dizziness with sitting up. No nystagmus was seen.  Gait and station: Gait is normal. Tandem gait is slightly unsteady. Romberg is negative. No drift is seen.  Reflexes: Deep tendon reflexes are symmetric, but are depressed bilaterally. Toes are downgoing bilaterally.   Assessment/Plan:  1. Episodic dizziness  2. Diabetes  3. Hypertension  The patient does have risk factors for cerebrovascular disease. The patient will undergo MRI evaluation of the brain. She is on low-dose aspirin. The clinical examination today is relatively unremarkable, no obvious source of the dizziness is seen. The patient is not clear whether the dizziness began with the dosing of the lisinopril or not. She will follow-up in 4 months.  Jill Alexanders MD 06/29/2015 6:53 PM  [facility] [address] [place],  [address]  Phone [phone] Fax [phone]

## 2015-07-06 ENCOUNTER — Other Ambulatory Visit: Payer: Self-pay | Admitting: Neurology

## 2015-07-10 ENCOUNTER — Ambulatory Visit
Admission: RE | Admit: 2015-07-10 | Discharge: 2015-07-10 | Disposition: A | Discharge status: Home / Self Care | Payer: Medicare HMO | Source: Ambulatory Visit | Attending: Neurology | Admitting: Neurology

## 2015-07-10 ENCOUNTER — Telehealth: Payer: Self-pay | Admitting: Neurology

## 2015-07-10 DIAGNOSIS — R42 Dizziness and giddiness: Secondary | ICD-10-CM

## 2015-07-10 NOTE — Telephone Encounter (Signed)
I called the patient. MRI the brain is unremarkable. The patient was not sure whether or not the dizziness started when the lisinopril started. A trial off the medication for 2 weeks may be helpful to know if this is the etiology of her symptoms.   MRI brain 07/10/15:  IMPRESSION: This noncontrasted MRI of the brain shows the following: 1. A few punctate T2/FLAIR hyperintense foci are noted in the subcortical and deep white matter which is typical for age. 2. The internal auditory canals appear normal 3. There are no acute findings.

## 2015-07-15 DIAGNOSIS — W010XXA Fall on same level from slipping, tripping and stumbling without subsequent striking against object, initial encounter: Secondary | ICD-10-CM | POA: Diagnosis not present

## 2015-07-15 DIAGNOSIS — Z7982 Long term (current) use of aspirin: Secondary | ICD-10-CM | POA: Diagnosis not present

## 2015-07-15 DIAGNOSIS — M25511 Pain in right shoulder: Secondary | ICD-10-CM | POA: Diagnosis not present

## 2015-07-15 DIAGNOSIS — S9032XA Contusion of left foot, initial encounter: Secondary | ICD-10-CM | POA: Diagnosis not present

## 2015-07-15 DIAGNOSIS — Z79899 Other long term (current) drug therapy: Secondary | ICD-10-CM | POA: Diagnosis not present

## 2015-07-15 DIAGNOSIS — M79672 Pain in left foot: Secondary | ICD-10-CM | POA: Diagnosis not present

## 2015-07-15 DIAGNOSIS — S99922A Unspecified injury of left foot, initial encounter: Secondary | ICD-10-CM | POA: Diagnosis not present

## 2015-07-15 DIAGNOSIS — W1809XA Striking against other object with subsequent fall, initial encounter: Secondary | ICD-10-CM | POA: Diagnosis not present

## 2015-07-25 DIAGNOSIS — E875 Hyperkalemia: Secondary | ICD-10-CM | POA: Diagnosis not present

## 2015-07-25 DIAGNOSIS — I10 Essential (primary) hypertension: Secondary | ICD-10-CM | POA: Diagnosis not present

## 2015-07-25 DIAGNOSIS — E139 Other specified diabetes mellitus without complications: Secondary | ICD-10-CM | POA: Diagnosis not present

## 2015-07-25 DIAGNOSIS — E039 Hypothyroidism, unspecified: Secondary | ICD-10-CM | POA: Diagnosis not present

## 2015-07-25 DIAGNOSIS — R69 Illness, unspecified: Secondary | ICD-10-CM | POA: Diagnosis not present

## 2015-07-26 DIAGNOSIS — M25511 Pain in right shoulder: Secondary | ICD-10-CM | POA: Diagnosis not present

## 2015-07-26 DIAGNOSIS — M79672 Pain in left foot: Secondary | ICD-10-CM | POA: Diagnosis not present

## 2015-08-23 DIAGNOSIS — M79672 Pain in left foot: Secondary | ICD-10-CM | POA: Diagnosis not present

## 2015-08-23 DIAGNOSIS — M25511 Pain in right shoulder: Secondary | ICD-10-CM | POA: Diagnosis not present

## 2015-09-08 DIAGNOSIS — I10 Essential (primary) hypertension: Secondary | ICD-10-CM | POA: Diagnosis not present

## 2015-09-08 DIAGNOSIS — J019 Acute sinusitis, unspecified: Secondary | ICD-10-CM | POA: Diagnosis not present

## 2015-09-08 DIAGNOSIS — H1033 Unspecified acute conjunctivitis, bilateral: Secondary | ICD-10-CM | POA: Diagnosis not present

## 2015-10-04 DIAGNOSIS — H5200 Hypermetropia, unspecified eye: Secondary | ICD-10-CM | POA: Diagnosis not present

## 2015-10-04 DIAGNOSIS — Z0100 Encounter for examination of eyes and vision without abnormal findings: Secondary | ICD-10-CM | POA: Diagnosis not present

## 2015-10-04 DIAGNOSIS — H25813 Combined forms of age-related cataract, bilateral: Secondary | ICD-10-CM | POA: Diagnosis not present

## 2015-10-04 LAB — HM DIABETES EYE EXAM

## 2015-11-02 ENCOUNTER — Ambulatory Visit: Payer: Medicare HMO | Admitting: Neurology

## 2015-11-03 DIAGNOSIS — Z0100 Encounter for examination of eyes and vision without abnormal findings: Secondary | ICD-10-CM | POA: Diagnosis not present

## 2016-02-07 ENCOUNTER — Other Ambulatory Visit: Payer: Self-pay | Admitting: *Deleted

## 2016-02-07 MED ORDER — LEVOTHYROXINE SODIUM 100 MCG PO TABS: 100.0000 ug | ORAL_TABLET | Freq: Every day | ORAL | 0 refills | Status: DC

## 2016-02-07 MED ORDER — SERTRALINE HCL 100 MG PO TABS: 100.0000 mg | ORAL_TABLET | Freq: Every day | ORAL | 0 refills | Status: DC

## 2016-02-07 NOTE — Telephone Encounter (Signed)
Patient aware.

## 2016-02-07 NOTE — Telephone Encounter (Signed)
I will do ONE 90 day supply, and patient will need to be seen. She will be due labs and appointment.

## 2016-03-05 DIAGNOSIS — E139 Other specified diabetes mellitus without complications: Secondary | ICD-10-CM | POA: Diagnosis not present

## 2016-03-05 DIAGNOSIS — J209 Acute bronchitis, unspecified: Secondary | ICD-10-CM | POA: Diagnosis not present

## 2016-03-05 DIAGNOSIS — E039 Hypothyroidism, unspecified: Secondary | ICD-10-CM | POA: Diagnosis not present

## 2016-03-05 DIAGNOSIS — I10 Essential (primary) hypertension: Secondary | ICD-10-CM | POA: Diagnosis not present

## 2016-05-17 ENCOUNTER — Other Ambulatory Visit: Payer: Self-pay | Admitting: *Deleted

## 2016-05-17 MED ORDER — MELOXICAM 7.5 MG PO TABS: 7.5000 mg | ORAL_TABLET | Freq: Every day | ORAL | 0 refills | Status: DC

## 2016-05-17 MED ORDER — SERTRALINE HCL 100 MG PO TABS: 100.0000 mg | ORAL_TABLET | Freq: Every day | ORAL | 0 refills | Status: DC

## 2016-05-17 MED ORDER — LEVOTHYROXINE SODIUM 100 MCG PO TABS: 100.0000 ug | ORAL_TABLET | Freq: Every day | ORAL | 0 refills | Status: DC

## 2016-05-17 NOTE — Progress Notes (Signed)
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

## 2016-05-29 ENCOUNTER — Encounter (INDEPENDENT_AMBULATORY_CARE_PROVIDER_SITE_OTHER): Payer: Self-pay

## 2016-05-29 ENCOUNTER — Ambulatory Visit (INDEPENDENT_AMBULATORY_CARE_PROVIDER_SITE_OTHER): Payer: Medicare HMO | Admitting: Physician Assistant

## 2016-05-29 ENCOUNTER — Encounter: Payer: Self-pay | Admitting: Physician Assistant

## 2016-05-29 VITALS — BP 135/71 | HR 65 | Temp 98.5°F | Ht 58.5 in | Wt 238.6 lb

## 2016-05-29 DIAGNOSIS — E039 Hypothyroidism, unspecified: Secondary | ICD-10-CM

## 2016-05-29 DIAGNOSIS — M8949 Other hypertrophic osteoarthropathy, multiple sites: Secondary | ICD-10-CM

## 2016-05-29 DIAGNOSIS — R69 Illness, unspecified: Secondary | ICD-10-CM | POA: Diagnosis not present

## 2016-05-29 DIAGNOSIS — M159 Polyosteoarthritis, unspecified: Secondary | ICD-10-CM | POA: Insufficient documentation

## 2016-05-29 DIAGNOSIS — I10 Essential (primary) hypertension: Secondary | ICD-10-CM | POA: Diagnosis not present

## 2016-05-29 DIAGNOSIS — K219 Gastro-esophageal reflux disease without esophagitis: Secondary | ICD-10-CM | POA: Diagnosis not present

## 2016-05-29 DIAGNOSIS — E1169 Type 2 diabetes mellitus with other specified complication: Secondary | ICD-10-CM | POA: Insufficient documentation

## 2016-05-29 DIAGNOSIS — E7800 Pure hypercholesterolemia, unspecified: Secondary | ICD-10-CM

## 2016-05-29 DIAGNOSIS — F3341 Major depressive disorder, recurrent, in partial remission: Secondary | ICD-10-CM | POA: Diagnosis not present

## 2016-05-29 DIAGNOSIS — M150 Primary generalized (osteo)arthritis: Secondary | ICD-10-CM | POA: Insufficient documentation

## 2016-05-29 DIAGNOSIS — E119 Type 2 diabetes mellitus without complications: Secondary | ICD-10-CM | POA: Insufficient documentation

## 2016-05-29 LAB — BAYER DCA HB A1C WAIVED: HB A1C (BAYER DCA - WAIVED): 8.1 % — ABNORMAL HIGH (ref <7.0)

## 2016-05-29 MED ORDER — METFORMIN HCL 1000 MG PO TABS: 1000.0000 mg | ORAL_TABLET | Freq: Two times a day (BID) | ORAL | 0 refills | Status: DC

## 2016-05-29 MED ORDER — BLOOD GLUCOSE MONITOR KIT: PACK | 11 refills | Status: AC

## 2016-05-29 MED ORDER — SERTRALINE HCL 100 MG PO TABS: 100.0000 mg | ORAL_TABLET | Freq: Every day | ORAL | 3 refills | Status: DC

## 2016-05-29 MED ORDER — MELOXICAM 7.5 MG PO TABS: 7.5000 mg | ORAL_TABLET | Freq: Every day | ORAL | 1 refills | Status: DC

## 2016-05-29 MED ORDER — LEVOTHYROXINE SODIUM 100 MCG PO TABS: 100.0000 ug | ORAL_TABLET | Freq: Every day | ORAL | 1 refills | Status: DC

## 2016-05-29 MED ORDER — LISINOPRIL 10 MG PO TABS: 10.0000 mg | ORAL_TABLET | Freq: Every day | ORAL | 1 refills | Status: DC

## 2016-05-29 NOTE — Progress Notes (Signed)
BP 135/71   Pulse 65   Temp 98.5 F (36.9 C) (Oral)   Ht 4' 10.5" (1.486 m)   Wt 238 lb 9.6 oz (108.2 kg)   BMI 49.02 kg/m    Subjective:    Patient ID: [name], female    DOB: April 18, 1951, 66 y.o.   MRN: [id number]  [name] is a 66 y.o. female presenting on 05/29/2016 for Establish Care  HPI Patient here to be established as new patient at [place].  This patient is known to me from [facility]. This patient comes in for periodic recheck on medications and conditions. All medications are reviewed today. There are no reports of any problems with the medications. All of the medical conditions are reviewed and updated.  Lab work is reviewed and will be ordered as medically necessary.    Past Medical History:  Diagnosis Date  . Arthritis   . Depression    ONGOING PROBLEM  . Diabetes mellitus without complication ([place])    DX 2008   USES PILLS  . Dizziness and giddiness 06/29/2015  . GERD (gastroesophageal reflux disease)   . Hypertension   . Hypothyroidism   . Morbidly obese ([place])   . Morton's neuroma of left foot    Relevant past medical, surgical, family and social history reviewed and updated as indicated. Interim medical history since our last visit reviewed. Allergies and medications reviewed and updated.   Data reviewed from any sources in EPIC.  Review of Systems  Constitutional: Negative.  Negative for activity change, fatigue and fever.  HENT: Negative.   Eyes: Negative.   Respiratory: Negative.  Negative for cough.   Cardiovascular: Negative.  Negative for chest pain.  Gastrointestinal: Negative.  Negative for abdominal pain.  Endocrine: Negative.   Genitourinary: Negative.  Negative for dysuria.  Musculoskeletal: Positive for arthralgias, back pain and joint swelling.  Skin: Negative.   Neurological: Negative.      Social History   Social History  . Marital status: Widowed    Spouse name: N/A  . Number  of children: 1  . Years of education: 72   Occupational History  . disability    Social History Main Topics  . Smoking status: Never Smoker  . Smokeless tobacco: Never Used  . Alcohol use No  . Drug use: No  . Sexual activity: Not on file   Other Topics Concern  . Not on file   Social History Narrative   Patient drinks 1 soda daily.   Patient is left handed.     Past Surgical History:  Procedure Laterality Date  . CARPAL TUNNEL RELEASE     LEFT  . CARPAL TUNNEL RELEASE Right 12/22/2012   Procedure: CARPAL TUNNEL RELEASE- RIGHT;  Surgeon: Meredith Pel, MD;  Location: [place];  Service: Orthopedics;  Laterality: Right;  . SHOULDER ARTHROSCOPY W/ ROTATOR CUFF REPAIR     LEFT  . SHOULDER ARTHROSCOPY WITH ROTATOR CUFF REPAIR Right 12/22/2012   Procedure: SHOULDER ARTHROSCOPY WITH ROTATOR CUFF REPAIR AND BICEPS TENODESIS REPAIR;  Surgeon: Meredith Pel, MD;  Location: [facility];  Service: Orthopedics;  Laterality: Right;  Right Carpal Tunnel Release, Right Shoulder Arthroscopy, Rotator Cuff Tear Repair, Biceps Tenodesis  . TOE SURGERY     LEFT BIG TOE  . TUBAL LIGATION      Family History  Problem Relation Age of Onset  . Heart attack Mother   . Cancer Father     brain  .  Heart failure Father   . Cancer Brother     Prostate  . Leukemia Brother   . Dementia Sister     alzheimers    Allergies as of 05/29/2016      Reactions   Penicillins Other (See Comments)   Reaction unknown   Shrimp [shellfish Allergy] Itching   Sulfa Antibiotics Other (See Comments)   Reaction unknown      Medication List       Accurate as of 05/29/16 11:59 PM. Always use your most recent med list.          aspirin EC 81 MG tablet Take 81 mg by mouth daily.   blood glucose meter kit and supplies Kit Dispense based on patient and insurance preference. Use up to four times daily as directed. (FOR ICD-9 250.00, 250.01).   levothyroxine 100 MCG tablet Commonly known as:  SYNTHROID,  LEVOTHROID Take 1 tablet (100 mcg total) by mouth daily before breakfast.   lisinopril 10 MG tablet Commonly known as:  PRINIVIL,ZESTRIL Take 1 tablet (10 mg total) by mouth daily.   meloxicam 7.5 MG tablet Commonly known as:  MOBIC Take 1 tablet (7.5 mg total) by mouth daily.   metFORMIN 1000 MG tablet Commonly known as:  GLUCOPHAGE Take 1 tablet (1,000 mg total) by mouth 2 (two) times daily with a meal.   sertraline 100 MG tablet Commonly known as:  ZOLOFT Take 1 tablet (100 mg total) by mouth daily.          Objective:    BP 135/71   Pulse 65   Temp 98.5 F (36.9 C) (Oral)   Ht 4' 10.5" (1.486 m)   Wt 238 lb 9.6 oz (108.2 kg)   BMI 49.02 kg/m   Allergies  Allergen Reactions  . Penicillins Other (See Comments)    Reaction unknown  . Shrimp [Shellfish Allergy] Itching  . Sulfa Antibiotics Other (See Comments)    Reaction unknown   Wt Readings from Last 3 Encounters:  05/29/16 238 lb 9.6 oz (108.2 kg)  07/10/15 235 lb (106.6 kg)  06/29/15 236 lb (107 kg)    Physical Exam  Constitutional: She is oriented to person, place, and time. She appears well-developed and well-nourished.  HENT:  Head: Normocephalic and atraumatic.  Right Ear: Tympanic membrane, external ear and ear canal normal.  Left Ear: Tympanic membrane, external ear and ear canal normal.  Nose: Nose normal. No rhinorrhea.  Mouth/Throat: Oropharynx is clear and moist and mucous membranes are normal. No oropharyngeal exudate or posterior oropharyngeal erythema.  Eyes: Conjunctivae and EOM are normal. Pupils are equal, round, and reactive to light.  Neck: Normal range of motion. Neck supple.  Cardiovascular: Normal rate, regular rhythm, normal heart sounds and intact distal pulses.   Pulmonary/Chest: Effort normal and breath sounds normal.  Abdominal: Soft. Bowel sounds are normal.  Neurological: She is alert and oriented to person, place, and time. She has normal reflexes.  Skin: Skin is warm and  dry. No rash noted.  Psychiatric: She has a normal mood and affect. Her behavior is normal. Judgment and thought content normal.  Nursing note and vitals reviewed.   Results for orders placed or performed in visit on 05/29/16  Bayer DCA Hb A1c Waived  Result Value Ref Range   Bayer DCA Hb A1c Waived 8.1 (H) <7.0 %  Lipid panel  Result Value Ref Range   Cholesterol, Total 201 (H) 100 - 199 mg/dL   Triglycerides 161 (H) 0 - 149 mg/dL  HDL 51 >39 mg/dL   VLDL Cholesterol Cal 32 5 - 40 mg/dL   LDL Calculated 118 (H) 0 - 99 mg/dL   Chol/HDL Ratio 3.9 0.0 - 4.4 ratio units  CMP14+EGFR  Result Value Ref Range   Glucose 144 (H) 65 - 99 mg/dL   BUN 13 8 - 27 mg/dL   Creatinine, Ser 0.72 0.57 - 1.00 mg/dL   GFR calc non Af Amer 89 >59 mL/min/1.73   GFR calc Af Amer 102 >59 mL/min/1.73   BUN/Creatinine Ratio 18 12 - 28   Sodium 142 134 - 144 mmol/L   Potassium 4.9 3.5 - 5.2 mmol/L   Chloride 101 96 - 106 mmol/L   CO2 27 18 - 29 mmol/L   Calcium 9.5 8.7 - 10.3 mg/dL   Total Protein 7.0 6.0 - 8.5 g/dL   Albumin 4.3 3.6 - 4.8 g/dL   Globulin, Total 2.7 1.5 - 4.5 g/dL   Albumin/Globulin Ratio 1.6 1.2 - 2.2   Bilirubin Total 0.3 0.0 - 1.2 mg/dL   Alkaline Phosphatase 61 39 - 117 IU/L   AST 19 0 - 40 IU/L   ALT 21 0 - 32 IU/L  TSH  Result Value Ref Range   TSH 3.070 0.450 - 4.500 uIU/mL      Assessment & Plan:   1. Gastroesophageal reflux disease without esophagitis  2. Type 2 diabetes mellitus without complication, without long-term current use of insulin (HCC) - metFORMIN (GLUCOPHAGE) 1000 MG tablet; Take 1 tablet (1,000 mg total) by mouth 2 (two) times daily with a meal.  Dispense: 180 tablet; Refill: 0 - Bayer DCA Hb A1c Waived - Lipid panel - blood glucose meter kit and supplies KIT; Dispense based on patient and insurance preference. Use up to four times daily as directed. (FOR ICD-9 250.00, 250.01).  Dispense: 1 each; Refill: 11  3. Hypothyroidism, unspecified type -  levothyroxine (SYNTHROID, LEVOTHROID) 100 MCG tablet; Take 1 tablet (100 mcg total) by mouth daily before breakfast.  Dispense: 90 tablet; Refill: 1 - TSH  4. Primary osteoarthritis involving multiple joints - meloxicam (MOBIC) 7.5 MG tablet; Take 1 tablet (7.5 mg total) by mouth daily.  Dispense: 90 tablet; Refill: 1  5. Essential hypertension - lisinopril (PRINIVIL,ZESTRIL) 10 MG tablet; Take 1 tablet (10 mg total) by mouth daily.  Dispense: 30 tablet; Refill: 1 - Bayer DCA Hb A1c Waived - Lipid panel - CMP14+EGFR - TSH  6. Recurrent major depressive disorder, in partial remission (HCC) - sertraline (ZOLOFT) 100 MG tablet; Take 1 tablet (100 mg total) by mouth daily.  Dispense: 90 tablet; Refill: 3  7. Elevated cholesterol - Lipid panel   Continue all other maintenance medications as listed above. Educational handout given for carb counting  Follow up plan: Return in about 3 months (around 08/27/2016) for recheck.  Terald Sleeper PA-C [facility] [address]  [place], [place] [address] [phone]   05/30/2016, 6:11 PM

## 2016-05-29 NOTE — Patient Instructions (Signed)

## 2016-05-30 LAB — CMP14+EGFR
A/G RATIO: 1.6 (ref 1.2–2.2)
ALBUMIN: 4.3 g/dL (ref 3.6–4.8)
ALK PHOS: 61 IU/L (ref 39–117)
ALT: 21 IU/L (ref 0–32)
AST: 19 IU/L (ref 0–40)
BILIRUBIN TOTAL: 0.3 mg/dL (ref 0.0–1.2)
BUN / CREAT RATIO: 18 (ref 12–28)
BUN: 13 mg/dL (ref 8–27)
CHLORIDE: 101 mmol/L (ref 96–106)
CO2: 27 mmol/L (ref 18–29)
Calcium: 9.5 mg/dL (ref 8.7–10.3)
Creatinine, Ser: 0.72 mg/dL (ref 0.57–1.00)
GFR calc Af Amer: 102 mL/min/{1.73_m2} (ref >59)
GFR calc non Af Amer: 89 mL/min/{1.73_m2} (ref >59)
GLOBULIN, TOTAL: 2.7 g/dL (ref 1.5–4.5)
Glucose: 144 mg/dL — ABNORMAL HIGH (ref 65–99)
Potassium: 4.9 mmol/L (ref 3.5–5.2)
SODIUM: 142 mmol/L (ref 134–144)
Total Protein: 7 g/dL (ref 6.0–8.5)

## 2016-05-30 LAB — LIPID PANEL
CHOLESTEROL TOTAL: 201 mg/dL — AB (ref 100–199)
Chol/HDL Ratio: 3.9 ratio units (ref 0.0–4.4)
HDL: 51 mg/dL (ref >39)
LDL CALC: 118 mg/dL — AB (ref 0–99)
TRIGLYCERIDES: 161 mg/dL — AB (ref 0–149)
VLDL Cholesterol Cal: 32 mg/dL (ref 5–40)

## 2016-05-30 LAB — TSH: TSH: 3.07 u[IU]/mL (ref 0.450–4.500)

## 2016-06-11 DIAGNOSIS — I10 Essential (primary) hypertension: Secondary | ICD-10-CM | POA: Diagnosis not present

## 2016-06-11 DIAGNOSIS — R69 Illness, unspecified: Secondary | ICD-10-CM | POA: Diagnosis not present

## 2016-06-11 DIAGNOSIS — N950 Postmenopausal bleeding: Secondary | ICD-10-CM | POA: Diagnosis not present

## 2016-06-11 DIAGNOSIS — Z01419 Encounter for gynecological examination (general) (routine) without abnormal findings: Secondary | ICD-10-CM | POA: Diagnosis not present

## 2016-07-02 DIAGNOSIS — N950 Postmenopausal bleeding: Secondary | ICD-10-CM | POA: Diagnosis not present

## 2016-07-02 DIAGNOSIS — N840 Polyp of corpus uteri: Secondary | ICD-10-CM | POA: Diagnosis not present

## 2016-07-02 DIAGNOSIS — D259 Leiomyoma of uterus, unspecified: Secondary | ICD-10-CM | POA: Diagnosis not present

## 2016-07-02 DIAGNOSIS — N858 Other specified noninflammatory disorders of uterus: Secondary | ICD-10-CM | POA: Diagnosis not present

## 2016-07-17 DIAGNOSIS — Z6841 Body Mass Index (BMI) 40.0 and over, adult: Secondary | ICD-10-CM | POA: Diagnosis not present

## 2016-07-17 DIAGNOSIS — N950 Postmenopausal bleeding: Secondary | ICD-10-CM | POA: Diagnosis not present

## 2016-09-03 ENCOUNTER — Encounter: Payer: Self-pay | Admitting: Physician Assistant

## 2016-09-03 ENCOUNTER — Ambulatory Visit (INDEPENDENT_AMBULATORY_CARE_PROVIDER_SITE_OTHER): Payer: Medicare HMO | Admitting: Physician Assistant

## 2016-09-03 VITALS — BP 134/69 | HR 64 | Temp 97.2°F | Ht 58.5 in | Wt 240.0 lb

## 2016-09-03 DIAGNOSIS — M159 Polyosteoarthritis, unspecified: Secondary | ICD-10-CM

## 2016-09-03 DIAGNOSIS — E119 Type 2 diabetes mellitus without complications: Secondary | ICD-10-CM

## 2016-09-03 DIAGNOSIS — M150 Primary generalized (osteo)arthritis: Secondary | ICD-10-CM

## 2016-09-03 DIAGNOSIS — E7800 Pure hypercholesterolemia, unspecified: Secondary | ICD-10-CM | POA: Diagnosis not present

## 2016-09-03 DIAGNOSIS — I10 Essential (primary) hypertension: Secondary | ICD-10-CM | POA: Diagnosis not present

## 2016-09-03 DIAGNOSIS — M8949 Other hypertrophic osteoarthropathy, multiple sites: Secondary | ICD-10-CM

## 2016-09-03 LAB — BAYER DCA HB A1C WAIVED: HB A1C (BAYER DCA - WAIVED): 7.7 % — ABNORMAL HIGH (ref <7.0)

## 2016-09-03 MED ORDER — LISINOPRIL 10 MG PO TABS: 10.0000 mg | ORAL_TABLET | Freq: Every day | ORAL | 1 refills | Status: DC

## 2016-09-03 NOTE — Patient Instructions (Signed)
Carbohydrate Counting for Diabetes Mellitus, Adult Carbohydrate counting is a method for keeping track of how many carbohydrates you eat. Eating carbohydrates naturally increases the amount of sugar (glucose) in the blood. Counting how many carbohydrates you eat helps keep your blood glucose within normal limits, which helps you manage your diabetes (diabetes mellitus). It is important to know how many carbohydrates you can safely have in each meal. This is different for every person. A diet and nutrition specialist (registered dietitian) can help you make a meal plan and calculate how many carbohydrates you should have at each meal and snack. Carbohydrates are found in the following foods:  Grains, such as breads and cereals.  Dried beans and soy products.  Starchy vegetables, such as potatoes, peas, and corn.  Fruit and fruit juices.  Milk and yogurt.  Sweets and snack foods, such as cake, cookies, candy, chips, and soft drinks. How do I count carbohydrates? There are two ways to count carbohydrates in food. You can use either of the methods or a combination of both. Reading "Nutrition Facts" on packaged food  The "Nutrition Facts" list is included on the labels of almost all packaged foods and beverages in the [place] It includes:  The serving size.  Information about nutrients in each serving, including the grams (g) of carbohydrate per serving. To use the "Nutrition Facts":  Decide how many servings you will have.  Multiply the number of servings by the number of carbohydrates per serving.  The resulting number is the total amount of carbohydrates that you will be having. Learning standard serving sizes of other foods  When you eat foods containing carbohydrates that are not packaged or do not include "Nutrition Facts" on the label, you need to measure the servings in order to count the amount of carbohydrates:  Measure the foods that you will eat with a food scale or measuring  cup, if needed.  Decide how many standard-size servings you will eat.  Multiply the number of servings by 15. Most carbohydrate-rich foods have about 15 g of carbohydrates per serving.  For example, if you eat 8 oz (170 g) of strawberries, you will have eaten 2 servings and 30 g of carbohydrates (2 servings x 15 g = 30 g).  For foods that have more than one food mixed, such as soups and casseroles, you must count the carbohydrates in each food that is included. The following list contains standard serving sizes of common carbohydrate-rich foods. Each of these servings has about 15 g of carbohydrates:   hamburger bun or  English muffin.   oz (15 mL) syrup.   oz (14 g) jelly.  1 slice of bread.  1 six-inch tortilla.  3 oz (85 g) cooked rice or pasta.  4 oz (113 g) cooked dried beans.  4 oz (113 g) starchy vegetable, such as peas, corn, or potatoes.  4 oz (113 g) hot cereal.  4 oz (113 g) mashed potatoes or  of a large baked potato.  4 oz (113 g) canned or frozen fruit.  4 oz (120 mL) fruit juice.  4-6 crackers.  6 chicken nuggets.  6 oz (170 g) unsweetened dry cereal.  6 oz (170 g) plain fat-free yogurt or yogurt sweetened with artificial sweeteners.  8 oz (240 mL) milk.  8 oz (170 g) fresh fruit or one small piece of fruit.  24 oz (680 g) popped popcorn. Example of carbohydrate counting Sample meal  3 oz (85 g) chicken breast.  6 oz (  170 g) brown rice.  4 oz (113 g) corn.  8 oz (240 mL) milk.  8 oz (170 g) strawberries with sugar-free whipped topping. Carbohydrate calculation 1. Identify the foods that contain carbohydrates:  Rice.  Corn.  Milk.  Strawberries. 2. Calculate how many servings you have of each food:  2 servings rice.  1 serving corn.  1 serving milk.  1 serving strawberries. 3. Multiply each number of servings by 15 g:  2 servings rice x 15 g = 30 g.  1 serving corn x 15 g = 15 g.  1 serving milk x 15 g = 15  g.  1 serving strawberries x 15 g = 15 g. 4. Add together all of the amounts to find the total grams of carbohydrates eaten:  30 g + 15 g + 15 g + 15 g = 75 g of carbohydrates total. This information is not intended to replace advice given to you by your health care provider. Make sure you discuss any questions you have with your health care provider. Document Released: 05/13/2005 Document Revised: 12/01/2015 Document Reviewed: 10/25/2015 Elsevier Interactive Patient Education  2017 Elsevier Inc.  

## 2016-09-03 NOTE — Progress Notes (Signed)
BP 134/69   Pulse 64   Temp 97.2 F (36.2 C) (Oral)   Ht 4' 10.5" (1.486 m)   Wt 240 lb (108.9 kg)   BMI 49.31 kg/m    Subjective:    Patient ID: [name], female    DOB: [birth year], 66 y.o.   MRN: [id number]  HPI: [name] is a 66 y.o. female presenting on 09/03/2016 for Follow-up; Diabetes; Hypothyroidism; and Hypertension  This patient comes in for periodic recheck on medications and conditions including Diabetes uncontrolled, hypertension, hypothyroidism, depression, osteoarthritis. The patient reports that she has been forgetting her evening metformin most days over the past 3 months. She does always take the morning. He will have labs drawn today and determine if we need to change to a once daily type medication to add to a morning metformin. She states she has not been walking much. She plans to start a walking program of 15 minutes per day on level surfaces. She does have plantar fasciitis and it flares up when she does very much hill or stair activity.    All medications are reviewed today. There are no reports of any problems with the medications. All of the medical conditions are reviewed and updated.  Lab work is reviewed and will be ordered as medically necessary. There are no new problems reported with today's visit.   Relevant past medical, surgical, family and social history reviewed and updated as indicated. Allergies and medications reviewed and updated.  Past Medical History:  Diagnosis Date  . Arthritis   . Depression    ONGOING PROBLEM  . Diabetes mellitus without complication ([place])    DX 2008   USES PILLS  . Dizziness and giddiness 06/29/2015  . GERD (gastroesophageal reflux disease)   . Hypertension   . Hypothyroidism   . Morbidly obese ([place])   . Morton's neuroma of left foot     Past Surgical History:  Procedure Laterality Date  . CARPAL TUNNEL RELEASE     LEFT  . CARPAL TUNNEL RELEASE Right 12/22/2012   Procedure: CARPAL TUNNEL RELEASE-  RIGHT;  Surgeon: Meredith Pel, MD;  Location: [facility];  Service: Orthopedics;  Laterality: Right;  . SHOULDER ARTHROSCOPY W/ ROTATOR CUFF REPAIR     LEFT  . SHOULDER ARTHROSCOPY WITH ROTATOR CUFF REPAIR Right 12/22/2012   Procedure: SHOULDER ARTHROSCOPY WITH ROTATOR CUFF REPAIR AND BICEPS TENODESIS REPAIR;  Surgeon: Meredith Pel, MD;  Location: [facility];  Service: Orthopedics;  Laterality: Right;  Right Carpal Tunnel Release, Right Shoulder Arthroscopy, Rotator Cuff Tear Repair, Biceps Tenodesis  . TOE SURGERY     LEFT BIG TOE  . TUBAL LIGATION      Review of Systems  Constitutional: Negative.  Negative for activity change, fatigue and fever.  HENT: Negative.   Eyes: Negative.   Respiratory: Negative.  Negative for cough.   Cardiovascular: Negative.  Negative for chest pain.  Gastrointestinal: Negative.  Negative for abdominal pain.  Endocrine: Negative.   Genitourinary: Negative.  Negative for dysuria.  Musculoskeletal: Positive for arthralgias, back pain, joint swelling and myalgias.  Skin: Negative.   Neurological: Negative.   Psychiatric/Behavioral: Positive for dysphoric mood. Negative for self-injury, sleep disturbance and suicidal ideas.    Allergies as of 09/03/2016      Reactions   Penicillins Other (See Comments)   Reaction unknown   Shrimp [shellfish Allergy] Itching   Sulfa Antibiotics Other (See Comments)   Reaction unknown      Medication List   BP 134/69   Pulse 64   Temp 97.2 F (36.2 C) (Oral)   Ht 4' 10.5" (1.486 m)   Wt 240 lb (108.9 kg)   BMI 49.31 kg/m    Subjective:    Patient ID: [name], female    DOB: 07/22/1950, 66 y.o.   MRN: [id number]  HPI: [name] is a 66 y.o. female presenting on 09/03/2016 for Follow-up; Diabetes; Hypothyroidism; and Hypertension  This patient comes in for periodic recheck on medications and conditions including Diabetes uncontrolled, hypertension, hypothyroidism, depression, osteoarthritis. The patient reports that she has been forgetting her evening metformin most days over the past 3 months. She does always take the morning. He will have labs drawn today and determine if we need to change to a once daily type medication to add to a morning metformin. She states she has not been walking much. She plans to start a walking program of 15 minutes per day on level surfaces. She does have plantar fasciitis and it flares up when she does very much hill or stair activity.    All medications are reviewed today. There are no reports of any problems with the medications. All of the medical conditions are reviewed and updated.  Lab work is reviewed and will be ordered as medically necessary. There are no new problems reported with today's visit.   Relevant past medical, surgical, family and social history reviewed and updated as indicated. Allergies and medications reviewed and updated.  Past Medical History:  Diagnosis Date  . Arthritis   . Depression    ONGOING PROBLEM  . Diabetes mellitus without complication (HCC)    DX 2008   USES PILLS  . Dizziness and giddiness 06/29/2015  . GERD (gastroesophageal reflux disease)   . Hypertension   . Hypothyroidism   . Morbidly obese (HCC)   . Morton's neuroma of left foot     Past Surgical History:  Procedure Laterality Date  . CARPAL TUNNEL RELEASE     LEFT  . CARPAL TUNNEL RELEASE Right 12/22/2012   Procedure: CARPAL TUNNEL RELEASE-  RIGHT;  Surgeon: Gregory Scott Dean, MD;  Location[facility];  Service: Orthopedics;  Laterality: Right;  . SHOULDER ARTHROSCOPY W/ ROTATOR CUFF REPAIR     LEFT  . SHOULDER ARTHROSCOPY WITH ROTATOR CUFF REPAIR Right 12/22/2012   Procedure: SHOULDER ARTHROSCOPY WITH ROTATOR CUFF REPAIR AND BICEPS TENODESIS REPAIR;  Surgeon: Gregory Scott Dean, MD;  Location: [facility];  Service: Orthopedics;  Laterality: Right;  Right Carpal Tunnel Release, Right Shoulder Arthroscopy, Rotator Cuff Tear Repair, Biceps Tenodesis  . TOE SURGERY     LEFT BIG TOE  . TUBAL LIGATION      Review of Systems  Constitutional: Negative.  Negative for activity change, fatigue and fever.  HENT: Negative.   Eyes: Negative.   Respiratory: Negative.  Negative for cough.   Cardiovascular: Negative.  Negative for chest pain.  Gastrointestinal: Negative.  Negative for abdominal pain.  Endocrine: Negative.   Genitourinary: Negative.  Negative for dysuria.  Musculoskeletal: Positive for arthralgias, back pain, joint swelling and myalgias.  Skin: Negative.   Neurological: Negative.   Psychiatric/Behavioral: Positive for dysphoric mood. Negative for self-injury, sleep disturbance and suicidal ideas.    Allergies as of 09/03/2016      Reactions   Penicillins Other (See Comments)   Reaction unknown   Shrimp [shellfish Allergy] Itching   Sulfa Antibiotics Other (See Comments)   Reaction unknown      Medication List         BP 134/69   Pulse 64   Temp 97.2 F (36.2 C) (Oral)   Ht 4' 10.5" (1.486 m)   Wt 240 lb (108.9 kg)   BMI 49.31 kg/m    Subjective:    Patient ID: [name], female    DOB: [birth year], 66 y.o.   MRN: [id number]  HPI: [name] is a 66 y.o. female presenting on 09/03/2016 for Follow-up; Diabetes; Hypothyroidism; and Hypertension  This patient comes in for periodic recheck on medications and conditions including Diabetes uncontrolled, hypertension, hypothyroidism, depression, osteoarthritis. The patient reports that she has been forgetting her evening metformin most days over the past 3 months. She does always take the morning. He will have labs drawn today and determine if we need to change to a once daily type medication to add to a morning metformin. She states she has not been walking much. She plans to start a walking program of 15 minutes per day on level surfaces. She does have plantar fasciitis and it flares up when she does very much hill or stair activity.    All medications are reviewed today. There are no reports of any problems with the medications. All of the medical conditions are reviewed and updated.  Lab work is reviewed and will be ordered as medically necessary. There are no new problems reported with today's visit.   Relevant past medical, surgical, family and social history reviewed and updated as indicated. Allergies and medications reviewed and updated.  Past Medical History:  Diagnosis Date  . Arthritis   . Depression    ONGOING PROBLEM  . Diabetes mellitus without complication ([place])    DX 2008   USES PILLS  . Dizziness and giddiness 06/29/2015  . GERD (gastroesophageal reflux disease)   . Hypertension   . Hypothyroidism   . Morbidly obese ([place])   . Morton's neuroma of left foot     Past Surgical History:  Procedure Laterality Date  . CARPAL TUNNEL RELEASE     LEFT  . CARPAL TUNNEL RELEASE Right 12/22/2012   Procedure: CARPAL TUNNEL RELEASE-  RIGHT;  Surgeon: Meredith Pel, MD;  Location: [facility];  Service: Orthopedics;  Laterality: Right;  . SHOULDER ARTHROSCOPY W/ ROTATOR CUFF REPAIR     LEFT  . SHOULDER ARTHROSCOPY WITH ROTATOR CUFF REPAIR Right 12/22/2012   Procedure: SHOULDER ARTHROSCOPY WITH ROTATOR CUFF REPAIR AND BICEPS TENODESIS REPAIR;  Surgeon: Meredith Pel, MD;  Location: [facility];  Service: Orthopedics;  Laterality: Right;  Right Carpal Tunnel Release, Right Shoulder Arthroscopy, Rotator Cuff Tear Repair, Biceps Tenodesis  . TOE SURGERY     LEFT BIG TOE  . TUBAL LIGATION      Review of Systems  Constitutional: Negative.  Negative for activity change, fatigue and fever.  HENT: Negative.   Eyes: Negative.   Respiratory: Negative.  Negative for cough.   Cardiovascular: Negative.  Negative for chest pain.  Gastrointestinal: Negative.  Negative for abdominal pain.  Endocrine: Negative.   Genitourinary: Negative.  Negative for dysuria.  Musculoskeletal: Positive for arthralgias, back pain, joint swelling and myalgias.  Skin: Negative.   Neurological: Negative.   Psychiatric/Behavioral: Positive for dysphoric mood. Negative for self-injury, sleep disturbance and suicidal ideas.    Allergies as of 09/03/2016      Reactions   Penicillins Other (See Comments)   Reaction unknown   Shrimp [shellfish Allergy] Itching   Sulfa Antibiotics Other (See Comments)   Reaction unknown      Medication List

## 2016-09-04 LAB — CMP14+EGFR
ALK PHOS: 60 IU/L (ref 39–117)
ALT: 30 IU/L (ref 0–32)
AST: 28 IU/L (ref 0–40)
Albumin/Globulin Ratio: 1.6 (ref 1.2–2.2)
Albumin: 4 g/dL (ref 3.6–4.8)
BUN/Creatinine Ratio: 18 (ref 12–28)
BUN: 13 mg/dL (ref 8–27)
Bilirubin Total: 0.3 mg/dL (ref 0.0–1.2)
CALCIUM: 9.1 mg/dL (ref 8.7–10.3)
CO2: 26 mmol/L (ref 18–29)
CREATININE: 0.72 mg/dL (ref 0.57–1.00)
Chloride: 99 mmol/L (ref 96–106)
GFR calc Af Amer: 102 mL/min/{1.73_m2} (ref >59)
GFR, EST NON AFRICAN AMERICAN: 89 mL/min/{1.73_m2} (ref >59)
GLUCOSE: 210 mg/dL — AB (ref 65–99)
Globulin, Total: 2.5 g/dL (ref 1.5–4.5)
Potassium: 4.7 mmol/L (ref 3.5–5.2)
Sodium: 141 mmol/L (ref 134–144)
Total Protein: 6.5 g/dL (ref 6.0–8.5)

## 2016-09-04 LAB — LIPID PANEL
CHOLESTEROL TOTAL: 198 mg/dL (ref 100–199)
Chol/HDL Ratio: 3.9 ratio (ref 0.0–4.4)
HDL: 51 mg/dL (ref >39)
LDL CALC: 107 mg/dL — AB (ref 0–99)
TRIGLYCERIDES: 201 mg/dL — AB (ref 0–149)
VLDL CHOLESTEROL CAL: 40 mg/dL (ref 5–40)

## 2016-09-04 LAB — MICROALBUMIN / CREATININE URINE RATIO
Creatinine, Urine: 148.7 mg/dL
Microalb/Creat Ratio: 4.4 mg/g creat (ref 0.0–30.0)
Microalbumin, Urine: 6.5 ug/mL

## 2016-09-10 ENCOUNTER — Encounter: Payer: Self-pay | Admitting: Family Medicine

## 2016-09-10 ENCOUNTER — Ambulatory Visit (INDEPENDENT_AMBULATORY_CARE_PROVIDER_SITE_OTHER): Payer: Medicare HMO | Admitting: Family Medicine

## 2016-09-10 VITALS — BP 136/71 | HR 63 | Temp 97.6°F | Ht 58.5 in | Wt 243.0 lb

## 2016-09-10 DIAGNOSIS — M25541 Pain in joints of right hand: Secondary | ICD-10-CM

## 2016-09-10 NOTE — Progress Notes (Signed)
Subjective:  Patient ID: [name], female    DOB: 07-28-1950  Age: 66 y.o. MRN: [id number]  CC: Hand Pain (pt here today c/o right hand discomfort mostly in the fingers after a spider bite a few weeks ago.)   HPI [name] presents for aching of MCPs 2-4  Not improving after several weeks. Able to use the fingers. No edema. No skin lesion.  History [name] has a past medical history of Arthritis; Depression; Diabetes mellitus without complication ([place]); Dizziness and giddiness (06/29/2015); GERD (gastroesophageal reflux disease); Hypertension; Hypothyroidism; Morbidly obese ([place]); and Morton's neuroma of left foot.   She has a past surgical history that includes Toe Surgery; Tubal ligation; Shoulder arthroscopy w/ rotator cuff repair; Carpal tunnel release; Shoulder arthroscopy with rotator cuff repair (Right, 12/22/2012); and Carpal tunnel release (Right, 12/22/2012).   Her family history includes Cancer in her brother and father; Dementia in her sister; Diabetes in her sister; Heart attack in her mother; Heart disease in her sister; Heart failure in her father and sister; Hyperlipidemia in her sister; Hypertension in her sister; Leukemia in her brother.She reports that she has never smoked. She has never used smokeless tobacco. She reports that she does not drink alcohol or use drugs.  Current Outpatient Prescriptions on File Prior to Visit  Medication Sig Dispense Refill  . aspirin EC 81 MG tablet Take 81 mg by mouth daily.    . blood glucose meter kit and supplies KIT Dispense based on patient and insurance preference. Use up to four times daily as directed. (FOR ICD-9 250.00, 250.01). 1 each 11  . levothyroxine (SYNTHROID, LEVOTHROID) 100 MCG tablet Take 1 tablet (100 mcg total) by mouth daily before breakfast. 90 tablet 1  . lisinopril (PRINIVIL,ZESTRIL) 10 MG tablet Take 1 tablet (10 mg total) by mouth daily. 90 tablet 1  . meloxicam (MOBIC) 7.5 MG tablet Take 1 tablet (7.5  mg total) by mouth daily. 90 tablet 1  . metFORMIN (GLUCOPHAGE) 1000 MG tablet Take 1 tablet (1,000 mg total) by mouth 2 (two) times daily with a meal. 180 tablet 0  . sertraline (ZOLOFT) 100 MG tablet Take 1 tablet (100 mg total) by mouth daily. 90 tablet 3   No current facility-administered medications on file prior to visit.     ROS Review of Systems  Constitutional: Negative for fever.  Musculoskeletal: Positive for arthralgias. Negative for joint swelling and myalgias.  Neurological: Negative for weakness.    Objective:  BP 136/71   Pulse 63   Temp 97.6 F (36.4 C) (Oral)   Ht 4' 10.5" (1.486 m)   Wt 243 lb (110.2 kg)   BMI 49.92 kg/m   Physical Exam  Constitutional: She is oriented to person, place, and time. She appears well-developed and well-nourished. No distress.  Musculoskeletal:  The affected fingers have full range of motion. There is no edema or erythema. There is minimal tenderness for percussion/palpation  Neurological: She is alert and oriented to person, place, and time. She exhibits normal muscle tone. Coordination normal.  Skin: Skin is warm and dry. No rash noted. No erythema.  Psychiatric: She has a normal mood and affect.    Assessment & Plan:   There are no diagnoses linked to this encounter. I am having [name] maintain her aspirin EC, metFORMIN, levothyroxine, sertraline, meloxicam, blood glucose meter kit and supplies, and lisinopril.  No orders of the defined types were placed in this encounter.    Follow-up: Return if symptoms worsen or  fail to improve.  Claretta Fraise, M.D.

## 2016-10-16 ENCOUNTER — Other Ambulatory Visit: Payer: Self-pay | Admitting: Physician Assistant

## 2016-10-16 DIAGNOSIS — E119 Type 2 diabetes mellitus without complications: Secondary | ICD-10-CM

## 2016-10-24 ENCOUNTER — Telehealth: Payer: Self-pay | Admitting: Physician Assistant

## 2016-10-30 ENCOUNTER — Telehealth: Payer: Self-pay | Admitting: Physician Assistant

## 2016-10-30 NOTE — Telephone Encounter (Signed)
Call given to nurse °

## 2016-12-04 ENCOUNTER — Encounter: Payer: Self-pay | Admitting: Physician Assistant

## 2016-12-04 ENCOUNTER — Ambulatory Visit (INDEPENDENT_AMBULATORY_CARE_PROVIDER_SITE_OTHER): Payer: Medicare HMO | Admitting: Physician Assistant

## 2016-12-04 VITALS — BP 139/71 | HR 61 | Temp 97.4°F | Wt 238.0 lb

## 2016-12-04 DIAGNOSIS — E119 Type 2 diabetes mellitus without complications: Secondary | ICD-10-CM | POA: Diagnosis not present

## 2016-12-04 DIAGNOSIS — I10 Essential (primary) hypertension: Secondary | ICD-10-CM

## 2016-12-04 DIAGNOSIS — K219 Gastro-esophageal reflux disease without esophagitis: Secondary | ICD-10-CM

## 2016-12-04 DIAGNOSIS — E039 Hypothyroidism, unspecified: Secondary | ICD-10-CM

## 2016-12-04 LAB — BAYER DCA HB A1C WAIVED: HB A1C (BAYER DCA - WAIVED): 7.9 % — ABNORMAL HIGH (ref <7.0)

## 2016-12-04 MED ORDER — LEVOTHYROXINE SODIUM 100 MCG PO TABS
100.0000 ug | ORAL_TABLET | Freq: Every day | ORAL | 1 refills | Status: DC
Start: 2016-12-04 — End: 2017-05-13

## 2016-12-04 NOTE — Progress Notes (Signed)
BP 139/71   Pulse 61   Temp (!) 97.4 F (36.3 C) (Oral)   Wt 238 lb (108 kg)   BMI 48.90 kg/m    Subjective:    Patient ID: [name], female    DOB: [birth year], 66 y.o.   MRN: [id number]  HPI: [name] is a 66 y.o. female presenting on 12/04/2016 for Follow-up (3 month); Diabetes; Hypertension; and Hypothyroidism  This patient comes in for periodic recheck on medications and conditions including Hypertension, GERD, type 2 diabetes, hypothyroidism.   All medications are reviewed today. There are no reports of any problems with the medications. All of the medical conditions are reviewed and updated.  Lab work is reviewed and will be ordered as medically necessary. There are no new problems reported with today's visit.   Relevant past medical, surgical, family and social history reviewed and updated as indicated. Allergies and medications reviewed and updated.  Past Medical History:  Diagnosis Date  . Arthritis   . Depression    ONGOING PROBLEM  . Diabetes mellitus without complication ([place])    DX 2008   USES PILLS  . Dizziness and giddiness 06/29/2015  . GERD (gastroesophageal reflux disease)   . Hypertension   . Hypothyroidism   . Morbidly obese ([place])   . Morton's neuroma of left foot     Past Surgical History:  Procedure Laterality Date  . CARPAL TUNNEL RELEASE     LEFT  . CARPAL TUNNEL RELEASE Right 12/22/2012   Procedure: CARPAL TUNNEL RELEASE- RIGHT;  Surgeon: Meredith Pel, MD;  Location: [facility];  Service: Orthopedics;  Laterality: Right;  . SHOULDER ARTHROSCOPY W/ ROTATOR CUFF REPAIR     LEFT  . SHOULDER ARTHROSCOPY WITH ROTATOR CUFF REPAIR Right 12/22/2012   Procedure: SHOULDER ARTHROSCOPY WITH ROTATOR CUFF REPAIR AND BICEPS TENODESIS REPAIR;  Surgeon: Meredith Pel, MD;  Location: [facility];  Service: Orthopedics;  Laterality: Right;  Right Carpal Tunnel Release, Right Shoulder Arthroscopy, Rotator Cuff Tear Repair, Biceps Tenodesis  . TOE  SURGERY     LEFT BIG TOE  . TUBAL LIGATION      Review of Systems  Constitutional: Negative.  Negative for activity change, fatigue and fever.  HENT: Negative.   Eyes: Negative.   Respiratory: Negative.  Negative for cough.   Cardiovascular: Negative.  Negative for chest pain.  Gastrointestinal: Negative.  Negative for abdominal pain.  Endocrine: Negative.   Genitourinary: Negative.  Negative for dysuria.  Musculoskeletal: Negative.   Skin: Negative.   Neurological: Negative.     Allergies as of 12/04/2016      Reactions   Penicillins Other (See Comments)   Reaction unknown   Shrimp [shellfish Allergy] Itching   Sulfa Antibiotics Other (See Comments)   Reaction unknown      Medication List       Accurate as of 12/04/16  5:11 PM. Always use your most recent med list.          aspirin EC 81 MG tablet Take 81 mg by mouth daily.   blood glucose meter kit and supplies Kit Dispense based on patient and insurance preference. Use up to four times daily as directed. (FOR ICD-9 250.00, 250.01).   levothyroxine 100 MCG tablet Commonly known as:  SYNTHROID, LEVOTHROID Take 1 tablet (100 mcg total) by mouth daily before breakfast.   lisinopril 10 MG tablet Commonly known as:  PRINIVIL,ZESTRIL Take 1 tablet (10 mg total) by mouth daily.   meloxicam 7.5 MG tablet  Commonly known as:  MOBIC Take 1 tablet (7.5 mg total) by mouth daily.   metFORMIN 1000 MG tablet Commonly known as:  GLUCOPHAGE TAKE ONE TABLET BY MOUTH TWICE DAILY WITH MEALS   sertraline 100 MG tablet Commonly known as:  ZOLOFT Take 1 tablet (100 mg total) by mouth daily.          Objective:    BP 139/71   Pulse 61   Temp (!) 97.4 F (36.3 C) (Oral)   Wt 238 lb (108 kg)   BMI 48.90 kg/m   Allergies  Allergen Reactions  . Penicillins Other (See Comments)    Reaction unknown  . Shrimp [Shellfish Allergy] Itching  . Sulfa Antibiotics Other (See Comments)    Reaction unknown    Physical Exam    Constitutional: She is oriented to person, place, and time. She appears well-developed and well-nourished.  HENT:  Head: Normocephalic and atraumatic.  Right Ear: Tympanic membrane, external ear and ear canal normal.  Left Ear: Tympanic membrane, external ear and ear canal normal.  Nose: Nose normal. No rhinorrhea.  Mouth/Throat: Oropharynx is clear and moist and mucous membranes are normal. No oropharyngeal exudate or posterior oropharyngeal erythema.  Eyes: Conjunctivae and EOM are normal. Pupils are equal, round, and reactive to light.  Neck: Normal range of motion. Neck supple.  Cardiovascular: Normal rate, regular rhythm, normal heart sounds and intact distal pulses.   Pulmonary/Chest: Effort normal and breath sounds normal.  Abdominal: Soft. Bowel sounds are normal.  Neurological: She is alert and oriented to person, place, and time. She has normal reflexes.  Skin: Skin is warm and dry. No rash noted.  Psychiatric: She has a normal mood and affect. Her behavior is normal. Judgment and thought content normal.  Nursing note and vitals reviewed.   Results for orders placed or performed in visit on 12/04/16  Bayer DCA Hb A1c Waived  Result Value Ref Range   Bayer DCA Hb A1c Waived 7.9 (H) <7.0 %      Assessment & Plan:   1. Essential hypertension - CMP14+EGFR - Lipid panel - Bayer DCA Hb A1c Waived - TSH  2. Gastroesophageal reflux disease without esophagitis  3. Type 2 diabetes mellitus without complication, without long-term current use of insulin (HCC) - Bayer DCA Hb A1c Waived  4. Hypothyroidism, unspecified type - levothyroxine (SYNTHROID, LEVOTHROID) 100 MCG tablet; Take 1 tablet (100 mcg total) by mouth daily before breakfast.  Dispense: 90 tablet; Refill: 1 - TSH   Current Outpatient Prescriptions:  .  aspirin EC 81 MG tablet, Take 81 mg by mouth daily., Disp: , Rfl:  .  blood glucose meter kit and supplies KIT, Dispense based on patient and insurance  preference. Use up to four times daily as directed. (FOR ICD-9 250.00, 250.01)., Disp: 1 each, Rfl: 11 .  levothyroxine (SYNTHROID, LEVOTHROID) 100 MCG tablet, Take 1 tablet (100 mcg total) by mouth daily before breakfast., Disp: 90 tablet, Rfl: 1 .  lisinopril (PRINIVIL,ZESTRIL) 10 MG tablet, Take 1 tablet (10 mg total) by mouth daily., Disp: 90 tablet, Rfl: 1 .  meloxicam (MOBIC) 7.5 MG tablet, Take 1 tablet (7.5 mg total) by mouth daily., Disp: 90 tablet, Rfl: 1 .  metFORMIN (GLUCOPHAGE) 1000 MG tablet, TAKE ONE TABLET BY MOUTH TWICE DAILY WITH MEALS, Disp: 180 tablet, Rfl: 1 .  sertraline (ZOLOFT) 100 MG tablet, Take 1 tablet (100 mg total) by mouth daily., Disp: 90 tablet, Rfl: 3  Continue all other maintenance medications as listed above.  Follow up plan: Return in about 3 months (around 03/06/2017) for recheck.  Educational handout given for [place] PA-C [facility] [address]  [place], [place] [address] [phone]   12/04/2016, 5:11 PM

## 2016-12-04 NOTE — Patient Instructions (Signed)
In a few days you may receive a survey in the mail or online from [employer] regarding your visit with us today. Please take a moment to fill this out. Your feedback is very important to our whole office. It can help us better understand your needs as well as improve your experience and satisfaction. Thank you for taking your time to complete it. We care about you.  Aarian Cleaver, PA-C  

## 2016-12-05 LAB — LIPID PANEL
CHOL/HDL RATIO: 3.9 ratio (ref 0.0–4.4)
CHOLESTEROL TOTAL: 193 mg/dL (ref 100–199)
HDL: 50 mg/dL (ref >39)
LDL CALC: 118 mg/dL — AB (ref 0–99)
TRIGLYCERIDES: 125 mg/dL (ref 0–149)
VLDL Cholesterol Cal: 25 mg/dL (ref 5–40)

## 2016-12-05 LAB — CMP14+EGFR
A/G RATIO: 1.8 (ref 1.2–2.2)
ALBUMIN: 4.2 g/dL (ref 3.6–4.8)
ALK PHOS: 54 IU/L (ref 39–117)
ALT: 28 IU/L (ref 0–32)
AST: 29 IU/L (ref 0–40)
BUN / CREAT RATIO: 19 (ref 12–28)
BUN: 14 mg/dL (ref 8–27)
Bilirubin Total: 0.3 mg/dL (ref 0.0–1.2)
CO2: 25 mmol/L (ref 20–29)
Calcium: 9.4 mg/dL (ref 8.7–10.3)
Chloride: 100 mmol/L (ref 96–106)
Creatinine, Ser: 0.74 mg/dL (ref 0.57–1.00)
GFR calc Af Amer: 99 mL/min/{1.73_m2} (ref >59)
GFR calc non Af Amer: 86 mL/min/{1.73_m2} (ref >59)
GLOBULIN, TOTAL: 2.4 g/dL (ref 1.5–4.5)
Glucose: 192 mg/dL — ABNORMAL HIGH (ref 65–99)
POTASSIUM: 4.8 mmol/L (ref 3.5–5.2)
SODIUM: 139 mmol/L (ref 134–144)
Total Protein: 6.6 g/dL (ref 6.0–8.5)

## 2016-12-05 LAB — TSH: TSH: 1.29 u[IU]/mL (ref 0.450–4.500)

## 2016-12-09 DIAGNOSIS — R69 Illness, unspecified: Secondary | ICD-10-CM | POA: Diagnosis not present

## 2016-12-13 ENCOUNTER — Ambulatory Visit (INDEPENDENT_AMBULATORY_CARE_PROVIDER_SITE_OTHER): Payer: Medicare HMO | Admitting: Family Medicine

## 2016-12-13 ENCOUNTER — Ambulatory Visit (INDEPENDENT_AMBULATORY_CARE_PROVIDER_SITE_OTHER): Payer: Medicare HMO

## 2016-12-13 ENCOUNTER — Encounter: Payer: Self-pay | Admitting: Family Medicine

## 2016-12-13 ENCOUNTER — Other Ambulatory Visit: Payer: Self-pay | Admitting: Family Medicine

## 2016-12-13 VITALS — BP 132/82 | HR 72 | Temp 98.0°F | Ht 58.5 in | Wt 238.0 lb

## 2016-12-13 DIAGNOSIS — R69 Illness, unspecified: Secondary | ICD-10-CM | POA: Diagnosis not present

## 2016-12-13 DIAGNOSIS — R52 Pain, unspecified: Secondary | ICD-10-CM

## 2016-12-13 DIAGNOSIS — M25562 Pain in left knee: Secondary | ICD-10-CM | POA: Diagnosis not present

## 2016-12-13 NOTE — Progress Notes (Signed)
   Subjective:    Patient ID: [name], female    DOB: [birth year], 66 y.o.   MRN: [id number]  HPI Patient here today for continued left knee pain since a car door hit it 2 mos ago. Knee was not evaluated at that time the patient shows me pictures on her phone of a hematoma on the lateral aspect of the left lower leg. As one might expect, hematoma migrated to her foot with gravitational effect. She does have a history of DVT and I think that is her main concern today. She does have some pain in the area as well as numbness.   Patient Active Problem List   Diagnosis Date Noted  . Diabetes ([place]) 05/29/2016  . Gastroesophageal reflux disease without esophagitis 05/29/2016  . Hypothyroidism 05/29/2016  . Primary osteoarthritis involving multiple joints 05/29/2016  . Essential hypertension 05/29/2016  . Recurrent major depressive disorder, in partial remission ([place]) 05/29/2016  . Elevated cholesterol 05/29/2016  . Dizziness and giddiness 06/29/2015   Outpatient Encounter Prescriptions as of 12/13/2016  Medication Sig  . aspirin EC 81 MG tablet Take 81 mg by mouth daily.  . blood glucose meter kit and supplies KIT Dispense based on patient and insurance preference. Use up to four times daily as directed. (FOR ICD-9 250.00, 250.01).  Marland Kitchen levothyroxine (SYNTHROID, LEVOTHROID) 100 MCG tablet Take 1 tablet (100 mcg total) by mouth daily before breakfast.  . lisinopril (PRINIVIL,ZESTRIL) 10 MG tablet Take 1 tablet (10 mg total) by mouth daily.  . meloxicam (MOBIC) 7.5 MG tablet Take 1 tablet (7.5 mg total) by mouth daily.  . metFORMIN (GLUCOPHAGE) 1000 MG tablet TAKE ONE TABLET BY MOUTH TWICE DAILY WITH MEALS  . sertraline (ZOLOFT) 100 MG tablet Take 1 tablet (100 mg total) by mouth daily.   No facility-administered encounter medications on file as of 12/13/2016.       Review of Systems  Constitutional: Negative.   HENT: Negative.   Eyes: Negative.   Respiratory: Negative.     Cardiovascular: Negative.   Gastrointestinal: Negative.   Endocrine: Negative.   Genitourinary: Negative.   Musculoskeletal: Negative.        Left knee pain   Skin: Negative.   Allergic/Immunologic: Negative.   Neurological: Negative.   Hematological: Negative.   Psychiatric/Behavioral: Negative.        Objective:   Physical Exam  Constitutional: She appears well-developed and well-nourished.  Musculoskeletal:  Left leg: Knee joint per se is not tender. There is no crepitance. There is some mild tenderness over the lateral aspect in the area of proximal fibula. X-ray shows no bony abnormality. There is no swelling or erythema or increased temperature in the lower leg to suggest DVT.   BP 132/82   Pulse 72   Temp 98 F (36.7 C) (Oral)   Ht 4' 10.5" (1.486 m)   Wt 238 lb (108 kg)   BMI 48.90 kg/m         Assessment & Plan:  Contusion with hematoma of the left lower leg. There is no evidence clinically of DVT. Patient may apply some low-grade heat to improve circulation and thus healing. Feeling should return once nerves that have been contused heal.

## 2016-12-18 DIAGNOSIS — R69 Illness, unspecified: Secondary | ICD-10-CM | POA: Diagnosis not present

## 2017-01-09 DIAGNOSIS — R69 Illness, unspecified: Secondary | ICD-10-CM | POA: Diagnosis not present

## 2017-01-21 ENCOUNTER — Other Ambulatory Visit: Payer: Self-pay | Admitting: Physician Assistant

## 2017-01-21 DIAGNOSIS — M150 Primary generalized (osteo)arthritis: Principal | ICD-10-CM

## 2017-01-21 DIAGNOSIS — M159 Polyosteoarthritis, unspecified: Secondary | ICD-10-CM

## 2017-01-21 DIAGNOSIS — M8949 Other hypertrophic osteoarthropathy, multiple sites: Secondary | ICD-10-CM

## 2017-03-06 ENCOUNTER — Ambulatory Visit: Payer: Medicare HMO | Admitting: Physician Assistant

## 2017-03-21 DIAGNOSIS — Z23 Encounter for immunization: Secondary | ICD-10-CM | POA: Diagnosis not present

## 2017-03-24 ENCOUNTER — Other Ambulatory Visit: Payer: Self-pay | Admitting: Physician Assistant

## 2017-03-24 DIAGNOSIS — E039 Hypothyroidism, unspecified: Secondary | ICD-10-CM

## 2017-04-22 DIAGNOSIS — R69 Illness, unspecified: Secondary | ICD-10-CM | POA: Diagnosis not present

## 2017-05-06 ENCOUNTER — Other Ambulatory Visit: Payer: Self-pay | Admitting: *Deleted

## 2017-05-06 DIAGNOSIS — E119 Type 2 diabetes mellitus without complications: Secondary | ICD-10-CM

## 2017-05-06 MED ORDER — METFORMIN HCL 1000 MG PO TABS: 1000.0000 mg | ORAL_TABLET | Freq: Two times a day (BID) | ORAL | 0 refills | Status: DC

## 2017-05-13 ENCOUNTER — Encounter: Payer: Self-pay | Admitting: Physician Assistant

## 2017-05-13 ENCOUNTER — Ambulatory Visit (INDEPENDENT_AMBULATORY_CARE_PROVIDER_SITE_OTHER): Payer: Medicare HMO | Admitting: Physician Assistant

## 2017-05-13 VITALS — BP 159/75 | HR 64 | Temp 98.6°F | Ht 58.5 in | Wt 240.2 lb

## 2017-05-13 DIAGNOSIS — E119 Type 2 diabetes mellitus without complications: Secondary | ICD-10-CM

## 2017-05-13 DIAGNOSIS — I809 Phlebitis and thrombophlebitis of unspecified site: Secondary | ICD-10-CM

## 2017-05-13 DIAGNOSIS — F3341 Major depressive disorder, recurrent, in partial remission: Secondary | ICD-10-CM | POA: Diagnosis not present

## 2017-05-13 DIAGNOSIS — R69 Illness, unspecified: Secondary | ICD-10-CM | POA: Diagnosis not present

## 2017-05-13 MED ORDER — BUPROPION HCL ER (XL) 150 MG PO TB24: 300.0000 mg | ORAL_TABLET | Freq: Every day | ORAL | 2 refills | Status: DC

## 2017-05-13 MED ORDER — METFORMIN HCL 1000 MG PO TABS: 1000.0000 mg | ORAL_TABLET | Freq: Two times a day (BID) | ORAL | 0 refills | Status: DC

## 2017-05-13 MED ORDER — CLINDAMYCIN HCL 300 MG PO CAPS: 300.0000 mg | ORAL_CAPSULE | Freq: Three times a day (TID) | ORAL | 0 refills | Status: DC

## 2017-05-13 NOTE — Progress Notes (Signed)
BP (!) 159/75   Pulse 64   Temp 98.6 F (37 C) (Oral)   Ht 4' 10.5" (1.486 m)   Wt 240 lb 3.2 oz (109 kg)   BMI 49.35 kg/m    Subjective:    Patient ID: [name], female    DOB: 02/17/1951, 66 y.o.   MRN: [id number]  HPI: [name] is a 66 y.o. female presenting on 05/13/2017 for Arm Pain (left arm  pain since giving blood on 04/30/17, redness & swollen,)  She donated blood through the [facility] and had a lot of pain as they were extracting the needle.  She felt it be very sharp and deep. There has been no bruising, fever, chills, drainage.  Pain is worse when touch. No prior DVT or clotting  Relevant past medical, surgical, family and social history reviewed and updated as indicated. Allergies and medications reviewed and updated.  Past Medical History:  Diagnosis Date  . Arthritis   . Depression    ONGOING PROBLEM  . Diabetes mellitus without complication ([place])    DX 2008   USES PILLS  . Dizziness and giddiness 06/29/2015  . GERD (gastroesophageal reflux disease)   . Hypertension   . Hypothyroidism   . Morbidly obese ([place])   . Morton's neuroma of left foot     Past Surgical History:  Procedure Laterality Date  . CARPAL TUNNEL RELEASE     LEFT  . CARPAL TUNNEL RELEASE Right 12/22/2012   Procedure: CARPAL TUNNEL RELEASE- RIGHT;  Surgeon: Meredith Pel, MD;  Location: [facility];  Service: Orthopedics;  Laterality: Right;  . SHOULDER ARTHROSCOPY W/ ROTATOR CUFF REPAIR     LEFT  . SHOULDER ARTHROSCOPY WITH ROTATOR CUFF REPAIR Right 12/22/2012   Procedure: SHOULDER ARTHROSCOPY WITH ROTATOR CUFF REPAIR AND BICEPS TENODESIS REPAIR;  Surgeon: Meredith Pel, MD;  Location: [facility];  Service: Orthopedics;  Laterality: Right;  Right Carpal Tunnel Release, Right Shoulder Arthroscopy, Rotator Cuff Tear Repair, Biceps Tenodesis  . TOE SURGERY     LEFT BIG TOE  . TUBAL LIGATION      Review of Systems  Constitutional: Negative.  Negative for activity change, fatigue  and fever.  HENT: Negative.   Eyes: Negative.   Respiratory: Negative.  Negative for cough and shortness of breath.   Cardiovascular: Negative.  Negative for chest pain, palpitations and leg swelling.  Gastrointestinal: Negative.  Negative for abdominal pain.  Endocrine: Negative.   Genitourinary: Negative.  Negative for dysuria.  Musculoskeletal: Positive for joint swelling.  Skin: Positive for color change.  Neurological: Negative.     Allergies as of 05/13/2017      Reactions   Penicillins Other (See Comments)   Reaction unknown   Shrimp [shellfish Allergy] Itching   Sulfa Antibiotics Other (See Comments)   Reaction unknown      Medication List        Accurate as of 05/13/17  9:47 PM. Always use your most recent med list.          aspirin EC 81 MG tablet Take 81 mg by mouth daily.   blood glucose meter kit and supplies Kit Dispense based on patient and insurance preference. Use up to four times daily as directed. (FOR ICD-9 250.00, 250.01).   buPROPion 150 MG 24 hr tablet Commonly known as:  WELLBUTRIN XL Take 2 tablets (300 mg total) by mouth daily.   clindamycin 300 MG capsule Commonly known as:  CLEOCIN Take 1 capsule (300 mg  total) by mouth 3 (three) times daily.   levothyroxine 100 MCG tablet Commonly known as:  SYNTHROID, LEVOTHROID TAKE ONE TABLET BY MOUTH ONCE DAILY BEFORE BREAKFAST   lisinopril 10 MG tablet Commonly known as:  PRINIVIL,ZESTRIL Take 1 tablet (10 mg total) by mouth daily.   meloxicam 7.5 MG tablet Commonly known as:  MOBIC TAKE ONE TABLET BY MOUTH ONCE DAILY   metFORMIN 1000 MG tablet Commonly known as:  GLUCOPHAGE Take 1 tablet (1,000 mg total) by mouth 2 (two) times daily with a meal.          Objective:    BP (!) 159/75   Pulse 64   Temp 98.6 F (37 C) (Oral)   Ht 4' 10.5" (1.486 m)   Wt 240 lb 3.2 oz (109 kg)   BMI 49.35 kg/m   Allergies  Allergen Reactions  . Penicillins Other (See Comments)    Reaction  unknown  . Shrimp [Shellfish Allergy] Itching  . Sulfa Antibiotics Other (See Comments)    Reaction unknown    Physical Exam  Constitutional: She is oriented to person, place, and time. She appears well-developed and well-nourished.  HENT:  Head: Normocephalic and atraumatic.  Right Ear: Tympanic membrane, external ear and ear canal normal.  Left Ear: Tympanic membrane, external ear and ear canal normal.  Nose: Nose normal. No rhinorrhea.  Mouth/Throat: Oropharynx is clear and moist and mucous membranes are normal. No oropharyngeal exudate or posterior oropharyngeal erythema.  Eyes: Conjunctivae and EOM are normal. Pupils are equal, round, and reactive to light.  Neck: Normal range of motion. Neck supple.  Cardiovascular: Normal rate, regular rhythm, normal heart sounds and intact distal pulses.  Pulmonary/Chest: Effort normal and breath sounds normal.  Abdominal: Soft. Bowel sounds are normal.  Neurological: She is alert and oriented to person, place, and time. She has normal reflexes.  Skin: Skin is warm, dry and intact. Rash noted. No bruising and no ecchymosis noted. Rash is papular. There is erythema. No pallor.     Psychiatric: She has a normal mood and affect. Her behavior is normal. Judgment and thought content normal.    Results for orders placed or performed in visit on 12/30/16  HM DIABETES EYE EXAM  Result Value Ref Range   HM Diabetic Eye Exam No Retinopathy No Retinopathy      Assessment & Plan:   1. Type 2 diabetes mellitus without complication, without long-term current use of insulin (HCC) - metFORMIN (GLUCOPHAGE) 1000 MG tablet; Take 1 tablet (1,000 mg total) by mouth 2 (two) times daily with a meal.  Dispense: 60 tablet; Refill: 0  2. Phlebitis Call in 2 days if not better - clindamycin (CLEOCIN) 300 MG capsule; Take 1 capsule (300 mg total) by mouth 3 (three) times daily.  Dispense: 30 capsule; Refill: 0  3. Recurrent major depressive disorder, in partial  remission (HCC) - buPROPion (WELLBUTRIN XL) 150 MG 24 hr tablet; Take 2 tablets (300 mg total) by mouth daily.  Dispense: 60 tablet; Refill: 2    Current Outpatient Medications:  .  aspirin EC 81 MG tablet, Take 81 mg by mouth daily., Disp: , Rfl:  .  blood glucose meter kit and supplies KIT, Dispense based on patient and insurance preference. Use up to four times daily as directed. (FOR ICD-9 250.00, 250.01)., Disp: 1 each, Rfl: 11 .  buPROPion (WELLBUTRIN XL) 150 MG 24 hr tablet, Take 2 tablets (300 mg total) by mouth daily., Disp: 60 tablet, Rfl: 2 .  clindamycin (  CLEOCIN) 300 MG capsule, Take 1 capsule (300 mg total) by mouth 3 (three) times daily., Disp: 30 capsule, Rfl: 0 .  levothyroxine (SYNTHROID, LEVOTHROID) 100 MCG tablet, TAKE ONE TABLET BY MOUTH ONCE DAILY BEFORE BREAKFAST, Disp: 90 tablet, Rfl: 1 .  lisinopril (PRINIVIL,ZESTRIL) 10 MG tablet, Take 1 tablet (10 mg total) by mouth daily., Disp: 90 tablet, Rfl: 1 .  meloxicam (MOBIC) 7.5 MG tablet, TAKE ONE TABLET BY MOUTH ONCE DAILY, Disp: 90 tablet, Rfl: 0 .  metFORMIN (GLUCOPHAGE) 1000 MG tablet, Take 1 tablet (1,000 mg total) by mouth 2 (two) times daily with a meal., Disp: 60 tablet, Rfl: 0 Continue all other maintenance medications as listed above.  Follow up plan: Return in about 4 weeks (around 06/10/2017) for recheck.  Educational handout given for [place] PA-C [facility] [address]  [place], [place] [address] [phone]   05/13/2017, 9:47 PM

## 2017-05-13 NOTE — Patient Instructions (Signed)
Phlebitis Phlebitis is soreness and puffiness (swelling) in a vein. Follow these instructions at home:  Only take medicine as told by your doctor.  Raise (elevate) the affected limb on a pillow as told by your doctor.  Keep a warm pack on the affected vein as told by your doctor. Do not sleep with a heating pad.  Use special stockings or bandages around the area of the affected vein as told by your doctor. These will speed healing and keep the condition from coming back.  Talk to your doctor about all the medicines you take.  Get follow-up blood tests as told by your doctor.  If the phlebitis is in your legs: ? Avoid standing or resting for long periods. ? Keep your legs moving. Raise your legs when you sit or lie.  Do not smoke.  Follow-up with your doctor as told. Contact a doctor if:  You have strange bruises or bleeding.  Your puffiness or pain in the affected area is not getting better.  You are taking medicine to lessen puffiness (anti-inflammatory medicine), and you get belly pain.  You have a fever. Get help right away if:  The phlebitis gets worse and you have more pain, puffiness (swelling), or redness.  You have trouble breathing or have chest pain. This information is not intended to replace advice given to you by your health care provider. Make sure you discuss any questions you have with your health care provider. Document Released: 05/01/2009 Document Revised: 10/19/2015 Document Reviewed: 01/18/2013 Elsevier Interactive Patient Education  2017 [employer]  

## 2017-05-21 ENCOUNTER — Other Ambulatory Visit: Payer: Self-pay | Admitting: Physician Assistant

## 2017-05-21 DIAGNOSIS — M159 Polyosteoarthritis, unspecified: Secondary | ICD-10-CM

## 2017-05-21 DIAGNOSIS — M150 Primary generalized (osteo)arthritis: Principal | ICD-10-CM

## 2017-05-21 DIAGNOSIS — M8949 Other hypertrophic osteoarthropathy, multiple sites: Secondary | ICD-10-CM

## 2017-05-30 ENCOUNTER — Other Ambulatory Visit: Payer: Self-pay | Admitting: Physician Assistant

## 2017-05-30 DIAGNOSIS — I10 Essential (primary) hypertension: Secondary | ICD-10-CM

## 2017-06-10 ENCOUNTER — Ambulatory Visit (INDEPENDENT_AMBULATORY_CARE_PROVIDER_SITE_OTHER): Payer: Medicare HMO | Admitting: Physician Assistant

## 2017-06-10 ENCOUNTER — Encounter: Payer: Self-pay | Admitting: Physician Assistant

## 2017-06-10 VITALS — BP 131/66 | HR 59 | Temp 97.2°F | Ht 58.5 in | Wt 241.8 lb

## 2017-06-10 DIAGNOSIS — E039 Hypothyroidism, unspecified: Secondary | ICD-10-CM | POA: Diagnosis not present

## 2017-06-10 DIAGNOSIS — I10 Essential (primary) hypertension: Secondary | ICD-10-CM | POA: Diagnosis not present

## 2017-06-10 DIAGNOSIS — F3341 Major depressive disorder, recurrent, in partial remission: Secondary | ICD-10-CM | POA: Diagnosis not present

## 2017-06-10 DIAGNOSIS — R69 Illness, unspecified: Secondary | ICD-10-CM | POA: Diagnosis not present

## 2017-06-10 NOTE — Progress Notes (Signed)
BP 131/66   Pulse (!) 59   Temp (!) 97.2 F (36.2 C) (Oral)   Ht 4' 10.5" (1.486 m)   Wt 241 lb 12.8 oz (109.7 kg)   BMI 49.68 kg/m    Subjective:    Patient ID: [name], female    DOB: [birth year], 67 y.o.   MRN: [id number]  HPI: [name] is a 67 y.o. female presenting on 06/10/2017 for Follow-up (4 week rck phlebitis and new depression medication )  Report feeling about the same.  She has tapered down on her Zoloft but she is only gone up to 150 mg of bupropion.  I encouraged her to go on to the 300 mg.  We will recheck this in 2 months.  She has had a stressor going on and is quite anxious about it.  Otherwise she feels good with her blood pressure and other medications.  Also legs are healed very nicely.  Relevant past medical, surgical, family and social history reviewed and updated as indicated. Allergies and medications reviewed and updated.  Past Medical History:  Diagnosis Date  . Arthritis   . Depression    ONGOING PROBLEM  . Diabetes mellitus without complication ([facility])    DX 2008   USES PILLS  . Dizziness and giddiness 06/29/2015  . GERD (gastroesophageal reflux disease)   . Hypertension   . Hypothyroidism   . Morbidly obese ([facility])   . Morton's neuroma of left foot     Past Surgical History:  Procedure Laterality Date  . CARPAL TUNNEL RELEASE     LEFT  . CARPAL TUNNEL RELEASE Right 12/22/2012   Procedure: CARPAL TUNNEL RELEASE- RIGHT;  Surgeon: Meredith Pel, MD;  Location: [facility];  Service: Orthopedics;  Laterality: Right;  . SHOULDER ARTHROSCOPY W/ ROTATOR CUFF REPAIR     LEFT  . SHOULDER ARTHROSCOPY WITH ROTATOR CUFF REPAIR Right 12/22/2012   Procedure: SHOULDER ARTHROSCOPY WITH ROTATOR CUFF REPAIR AND BICEPS TENODESIS REPAIR;  Surgeon: Meredith Pel, MD;  Location: [facility];  Service: Orthopedics;  Laterality: Right;  Right Carpal Tunnel Release, Right Shoulder Arthroscopy, Rotator Cuff Tear Repair, Biceps Tenodesis  . TOE SURGERY     LEFT  BIG TOE  . TUBAL LIGATION      Review of Systems  Constitutional: Negative.  Negative for activity change, fatigue and fever.  HENT: Negative.   Eyes: Negative.   Respiratory: Negative.  Negative for cough.   Cardiovascular: Negative.  Negative for chest pain.  Gastrointestinal: Negative.  Negative for abdominal pain.  Endocrine: Negative.   Genitourinary: Negative.  Negative for dysuria.  Musculoskeletal: Positive for arthralgias and back pain.  Skin: Negative.   Neurological: Negative.   Psychiatric/Behavioral: Positive for decreased concentration.    Allergies as of 06/10/2017      Reactions   Penicillins Other (See Comments)   Reaction unknown   Shrimp [shellfish Allergy] Itching   Sulfa Antibiotics Other (See Comments)   Reaction unknown      Medication List        Accurate as of 06/10/17  9:24 AM. Always use your most recent med list.          aspirin EC 81 MG tablet Take 81 mg by mouth daily.   blood glucose meter kit and supplies Kit Dispense based on patient and insurance preference. Use up to four times daily as directed. (FOR ICD-9 250.00, 250.01).   buPROPion 150 MG 24 hr tablet Commonly known as:  WELLBUTRIN XL Take  2 tablets (300 mg total) by mouth daily.   levothyroxine 100 MCG tablet Commonly known as:  SYNTHROID, LEVOTHROID TAKE ONE TABLET BY MOUTH ONCE DAILY BEFORE BREAKFAST   lisinopril 10 MG tablet Commonly known as:  PRINIVIL,ZESTRIL TAKE 1 TABLET BY MOUTH ONCE DAILY   meloxicam 7.5 MG tablet Commonly known as:  MOBIC TAKE 1 TABLET BY MOUTH ONCE DAILY   metFORMIN 1000 MG tablet Commonly known as:  GLUCOPHAGE Take 1 tablet (1,000 mg total) by mouth 2 (two) times daily with a meal.          Objective:    BP 131/66   Pulse (!) 59   Temp (!) 97.2 F (36.2 C) (Oral)   Ht 4' 10.5" (1.486 m)   Wt 241 lb 12.8 oz (109.7 kg)   BMI 49.68 kg/m   Allergies  Allergen Reactions  . Penicillins Other (See Comments)    Reaction unknown    . Shrimp [Shellfish Allergy] Itching  . Sulfa Antibiotics Other (See Comments)    Reaction unknown    Physical Exam  Constitutional: She is oriented to person, place, and time. She appears well-developed and well-nourished.  HENT:  Head: Normocephalic and atraumatic.  Eyes: Conjunctivae and EOM are normal. Pupils are equal, round, and reactive to light.  Cardiovascular: Normal rate, regular rhythm, normal heart sounds and intact distal pulses.  Pulmonary/Chest: Effort normal and breath sounds normal.  Abdominal: Soft. Bowel sounds are normal.  Neurological: She is alert and oriented to person, place, and time. She has normal reflexes.  Skin: Skin is warm and dry. No rash noted.  Psychiatric: She has a normal mood and affect. Her behavior is normal. Judgment and thought content normal.  Nursing note and vitals reviewed.   Results for orders placed or performed in visit on 12/30/16  HM DIABETES EYE EXAM  Result Value Ref Range   HM Diabetic Eye Exam No Retinopathy No Retinopathy      Assessment & Plan:   1. Essential hypertension  2. Recurrent major depressive disorder, in partial remission ([facility])  3. Hypothyroidism, unspecified type    Current Outpatient Medications:  .  aspirin EC 81 MG tablet, Take 81 mg by mouth daily., Disp: , Rfl:  .  blood glucose meter kit and supplies KIT, Dispense based on patient and insurance preference. Use up to four times daily as directed. (FOR ICD-9 250.00, 250.01)., Disp: 1 each, Rfl: 11 .  buPROPion (WELLBUTRIN XL) 150 MG 24 hr tablet, Take 2 tablets (300 mg total) by mouth daily., Disp: 60 tablet, Rfl: 2 .  levothyroxine (SYNTHROID, LEVOTHROID) 100 MCG tablet, TAKE ONE TABLET BY MOUTH ONCE DAILY BEFORE BREAKFAST, Disp: 90 tablet, Rfl: 1 .  lisinopril (PRINIVIL,ZESTRIL) 10 MG tablet, TAKE 1 TABLET BY MOUTH ONCE DAILY, Disp: 90 tablet, Rfl: 1 .  meloxicam (MOBIC) 7.5 MG tablet, TAKE 1 TABLET BY MOUTH ONCE DAILY, Disp: 30 tablet, Rfl: 0 .   metFORMIN (GLUCOPHAGE) 1000 MG tablet, Take 1 tablet (1,000 mg total) by mouth 2 (two) times daily with a meal., Disp: 60 tablet, Rfl: 0 Continue all other maintenance medications as listed above.  Follow up plan: Return in about 2 months (around 08/08/2017) for recheck.  Educational handout given for [place] PA-C [facility] [address]  [place], [place] [address] [phone]   06/10/2017, 9:24 AM

## 2017-06-10 NOTE — Patient Instructions (Signed)
In a few days you may receive a survey in the mail or online from [employer] regarding your visit with us today. Please take a moment to fill this out. Your feedback is very important to our whole office. It can help us better understand your needs as well as improve your experience and satisfaction. Thank you for taking your time to complete it. We care about you.  Demian Maisel, PA-C  

## 2017-06-25 ENCOUNTER — Other Ambulatory Visit: Payer: Self-pay | Admitting: Physician Assistant

## 2017-06-25 DIAGNOSIS — M150 Primary generalized (osteo)arthritis: Principal | ICD-10-CM

## 2017-06-25 DIAGNOSIS — M159 Polyosteoarthritis, unspecified: Secondary | ICD-10-CM

## 2017-06-25 DIAGNOSIS — M8949 Other hypertrophic osteoarthropathy, multiple sites: Secondary | ICD-10-CM

## 2017-06-26 ENCOUNTER — Ambulatory Visit (INDEPENDENT_AMBULATORY_CARE_PROVIDER_SITE_OTHER): Payer: Medicare HMO | Admitting: Physician Assistant

## 2017-06-26 ENCOUNTER — Encounter: Payer: Self-pay | Admitting: Physician Assistant

## 2017-06-26 VITALS — BP 119/75 | HR 81 | Temp 96.7°F | Ht <= 58 in | Wt 235.0 lb

## 2017-06-26 DIAGNOSIS — R21 Rash and other nonspecific skin eruption: Secondary | ICD-10-CM | POA: Diagnosis not present

## 2017-06-26 DIAGNOSIS — T7840XA Allergy, unspecified, initial encounter: Secondary | ICD-10-CM

## 2017-06-26 MED ORDER — LORATADINE 10 MG PO TABS: 10.0000 mg | ORAL_TABLET | Freq: Every day | ORAL | 11 refills | Status: DC

## 2017-06-26 MED ORDER — PREDNISONE 10 MG (21) PO TBPK: ORAL_TABLET | ORAL | 0 refills | Status: DC

## 2017-06-26 NOTE — Progress Notes (Signed)
BP 119/75   Pulse 81   Temp (!) 96.7 F (35.9 C) (Oral)   Ht '4\' 10"'  (1.473 m)   Wt 235 lb (106.6 kg)   BMI 49.12 kg/m    Subjective:    Patient ID: [name], female    DOB: 1950-12-01, 67 y.o.   MRN: [id number]  HPI: [name] is a 67 y.o. female presenting on 06/26/2017 for Rash on legs  Actually 3 days ago the patient started with a severe burning on her legs bilaterally.  She had used in a year about a week ago on her legs.  She did not use it anywhere else.  She did notice some irritation.  However after wearing close and everything that time she is working out and significant with small papules.  She has a rash nowhere else.  She has not had any other intake of new foods or medicines.  Relevant past medical, surgical, family and social history reviewed and updated as indicated. Allergies and medications reviewed and updated.  Past Medical History:  Diagnosis Date  . Arthritis   . Depression    ONGOING PROBLEM  . Diabetes mellitus without complication ([facility])    DX 2008   USES PILLS  . Dizziness and giddiness 06/29/2015  . GERD (gastroesophageal reflux disease)   . Hypertension   . Hypothyroidism   . Morbidly obese ([facility])   . Morton's neuroma of left foot     Past Surgical History:  Procedure Laterality Date  . CARPAL TUNNEL RELEASE     LEFT  . CARPAL TUNNEL RELEASE Right 12/22/2012   Procedure: CARPAL TUNNEL RELEASE- RIGHT;  Surgeon: Meredith Pel, MD;  Location: [facility];  Service: Orthopedics;  Laterality: Right;  . SHOULDER ARTHROSCOPY W/ ROTATOR CUFF REPAIR     LEFT  . SHOULDER ARTHROSCOPY WITH ROTATOR CUFF REPAIR Right 12/22/2012   Procedure: SHOULDER ARTHROSCOPY WITH ROTATOR CUFF REPAIR AND BICEPS TENODESIS REPAIR;  Surgeon: Meredith Pel, MD;  Location: [facility];  Service: Orthopedics;  Laterality: Right;  Right Carpal Tunnel Release, Right Shoulder Arthroscopy, Rotator Cuff Tear Repair, Biceps Tenodesis  . TOE SURGERY     LEFT BIG TOE  . TUBAL  LIGATION      Review of Systems  Constitutional: Negative.   HENT: Negative.   Eyes: Negative.   Respiratory: Negative.   Gastrointestinal: Negative.   Genitourinary: Negative.   Skin: Positive for color change and rash.    Allergies as of 06/26/2017      Reactions   Penicillins Other (See Comments)   Reaction unknown   Shrimp [shellfish Allergy] Itching   Sulfa Antibiotics Other (See Comments)   Reaction unknown      Medication List        Accurate as of 06/26/17  5:40 PM. Always use your most recent med list.          aspirin EC 81 MG tablet Take 81 mg by mouth daily.   blood glucose meter kit and supplies Kit Dispense based on patient and insurance preference. Use up to four times daily as directed. (FOR ICD-9 250.00, 250.01).   buPROPion 150 MG 24 hr tablet Commonly known as:  WELLBUTRIN XL Take 2 tablets (300 mg total) by mouth daily.   levothyroxine 100 MCG tablet Commonly known as:  SYNTHROID, LEVOTHROID TAKE ONE TABLET BY MOUTH ONCE DAILY BEFORE BREAKFAST   lisinopril 10 MG tablet Commonly known as:  PRINIVIL,ZESTRIL TAKE 1 TABLET BY MOUTH ONCE DAILY  loratadine 10 MG tablet Commonly known as:  CLARITIN Take 1 tablet (10 mg total) by mouth daily.   meloxicam 7.5 MG tablet Commonly known as:  MOBIC TAKE 1 TABLET BY MOUTH ONCE DAILY   metFORMIN 1000 MG tablet Commonly known as:  GLUCOPHAGE Take 1 tablet (1,000 mg total) by mouth 2 (two) times daily with a meal.   predniSONE 10 MG (21) Tbpk tablet Commonly known as:  STERAPRED UNI-PAK 21 TAB As directed x 6 days          Objective:    BP 119/75   Pulse 81   Temp (!) 96.7 F (35.9 C) (Oral)   Ht '4\' 10"'  (1.473 m)   Wt 235 lb (106.6 kg)   BMI 49.12 kg/m   Allergies  Allergen Reactions  . Penicillins Other (See Comments)    Reaction unknown  . Shrimp [Shellfish Allergy] Itching  . Sulfa Antibiotics Other (See Comments)    Reaction unknown    Physical Exam  Constitutional: She is  oriented to person, place, and time. She appears well-developed and well-nourished.  HENT:  Head: Normocephalic and atraumatic.  Eyes: Conjunctivae and EOM are normal. Pupils are equal, round, and reactive to light.  Cardiovascular: Normal rate, regular rhythm, normal heart sounds and intact distal pulses.  Pulmonary/Chest: Effort normal and breath sounds normal.  Abdominal: Soft. Bowel sounds are normal.  Neurological: She is alert and oriented to person, place, and time. She has normal reflexes.  Skin: Skin is warm and dry. No rash noted.  Callus small papules on the legs with severe excoriation.  Mild erythema surrounding them.  The rash only occurs on the legs.  Psychiatric: She has a normal mood and affect. Her behavior is normal. Judgment and thought content normal.  Nursing note and vitals reviewed.       Assessment & Plan:   1. Allergic reaction, initial encounter - loratadine (CLARITIN) 10 MG tablet; Take 1 tablet (10 mg total) by mouth daily.  Dispense: 30 tablet; Refill: 11 - predniSONE (STERAPRED UNI-PAK 21 TAB) 10 MG (21) TBPK tablet; As directed x 6 days  Dispense: 21 tablet; Refill: 0    Current Outpatient Medications:  .  aspirin EC 81 MG tablet, Take 81 mg by mouth daily., Disp: , Rfl:  .  blood glucose meter kit and supplies KIT, Dispense based on patient and insurance preference. Use up to four times daily as directed. (FOR ICD-9 250.00, 250.01)., Disp: 1 each, Rfl: 11 .  buPROPion (WELLBUTRIN XL) 150 MG 24 hr tablet, Take 2 tablets (300 mg total) by mouth daily., Disp: 60 tablet, Rfl: 2 .  levothyroxine (SYNTHROID, LEVOTHROID) 100 MCG tablet, TAKE ONE TABLET BY MOUTH ONCE DAILY BEFORE BREAKFAST, Disp: 90 tablet, Rfl: 1 .  lisinopril (PRINIVIL,ZESTRIL) 10 MG tablet, TAKE 1 TABLET BY MOUTH ONCE DAILY, Disp: 90 tablet, Rfl: 1 .  loratadine (CLARITIN) 10 MG tablet, Take 1 tablet (10 mg total) by mouth daily., Disp: 30 tablet, Rfl: 11 .  meloxicam (MOBIC) 7.5 MG tablet,  TAKE 1 TABLET BY MOUTH ONCE DAILY, Disp: 30 tablet, Rfl: 0 .  metFORMIN (GLUCOPHAGE) 1000 MG tablet, Take 1 tablet (1,000 mg total) by mouth 2 (two) times daily with a meal., Disp: 60 tablet, Rfl: 0 .  predniSONE (STERAPRED UNI-PAK 21 TAB) 10 MG (21) TBPK tablet, As directed x 6 days, Disp: 21 tablet, Rfl: 0 Continue all other maintenance medications as listed above.  Follow up plan: No Follow-up on file.  Educational handout given  for [place] PA-C [facility] [address]  [place], [place] [address] [phone]   06/26/2017, 5:40 PM

## 2017-06-26 NOTE — Patient Instructions (Signed)
In a few days you may receive a survey in the mail or online from [employer] regarding your visit with us today. Please take a moment to fill this out. Your feedback is very important to our whole office. It can help us better understand your needs as well as improve your experience and satisfaction. Thank you for taking your time to complete it. We care about you.  Arliene Rosenow, PA-C  

## 2017-07-01 ENCOUNTER — Other Ambulatory Visit: Payer: Self-pay | Admitting: *Deleted

## 2017-07-01 ENCOUNTER — Telehealth: Payer: Self-pay | Admitting: Physician Assistant

## 2017-07-01 DIAGNOSIS — F3341 Major depressive disorder, recurrent, in partial remission: Secondary | ICD-10-CM

## 2017-07-01 MED ORDER — BUPROPION HCL ER (XL) 150 MG PO TB24: 300.0000 mg | ORAL_TABLET | Freq: Every day | ORAL | 2 refills | Status: DC

## 2017-07-01 NOTE — Telephone Encounter (Signed)
Stop the prednisone and continue

## 2017-07-01 NOTE — Telephone Encounter (Signed)
Aware. 

## 2017-07-01 NOTE — Telephone Encounter (Signed)
Please advise 

## 2017-07-03 ENCOUNTER — Other Ambulatory Visit: Payer: Self-pay | Admitting: Physician Assistant

## 2017-07-03 DIAGNOSIS — R69 Illness, unspecified: Secondary | ICD-10-CM | POA: Diagnosis not present

## 2017-07-14 ENCOUNTER — Telehealth: Payer: Self-pay | Admitting: Physician Assistant

## 2017-07-14 NOTE — Telephone Encounter (Signed)
Patient stopped prednisone around Feb 5th and her BS was over 200 this morning. Patient states that she is taking claritin. She states that she stays nauseated all the time

## 2017-07-14 NOTE — Telephone Encounter (Signed)
Stop the prednisone. Can take a benadryl for reaction.

## 2017-07-14 NOTE — Telephone Encounter (Signed)
appt scheduled for evaluation Pt notified  

## 2017-07-15 ENCOUNTER — Ambulatory Visit (INDEPENDENT_AMBULATORY_CARE_PROVIDER_SITE_OTHER): Payer: Medicare HMO | Admitting: Family Medicine

## 2017-07-15 ENCOUNTER — Encounter: Payer: Self-pay | Admitting: Family Medicine

## 2017-07-15 VITALS — BP 125/66 | HR 75 | Temp 98.0°F | Ht <= 58 in | Wt 232.0 lb

## 2017-07-15 DIAGNOSIS — E119 Type 2 diabetes mellitus without complications: Secondary | ICD-10-CM | POA: Diagnosis not present

## 2017-07-15 DIAGNOSIS — K2900 Acute gastritis without bleeding: Secondary | ICD-10-CM

## 2017-07-15 LAB — BAYER DCA HB A1C WAIVED: HB A1C (BAYER DCA - WAIVED): 9.3 % — ABNORMAL HIGH (ref <7.0)

## 2017-07-15 MED ORDER — OMEPRAZOLE 20 MG PO CPDR: 20.0000 mg | DELAYED_RELEASE_CAPSULE | Freq: Every day | ORAL | 0 refills | Status: DC

## 2017-07-15 NOTE — Progress Notes (Signed)
Subjective: CC: nausea PCP: Terald Sleeper, PA-C [id number] [name] is a 67 y.o. female presenting to clinic today for:  1. Nausea Patient reports that she has been having nausea without vomiting for the last 3 weeks.  She notes that she had associated diarrhea which is resolving.  Diarrhea is nonbloody.  She notes slight abdominal discomfort in the epigastric area that occasionally radiates upwards.  She does note that at nighttime she will have reflux into her throat which awakens her.  Denies hematochezia, melena, fevers, chills, vomiting, decreased p.o. intake.  She has been using Tums with some relief but nothing that lasts very long.  She does not find any particular foods that seem to flare symptoms.  She does note that she has been having these symptoms since she was on oral steroids for an allergic reaction in January.  She also notes that she takes both meloxicam and ibuprofen for various orthopedic elements.  2.  Type 2 diabetes Patient notes that she is compliant with metformin 1000 mg p.o. twice daily.  She does report that blood sugars have been upwards of 200 since use of prednisone.  She has not had her A1c checked in a while.  Denies any blurry vision with glasses use.  Nausea as above.  ROS: Per HPI  Allergies  Allergen Reactions  . Penicillins Other (See Comments)    Reaction unknown  . Shrimp [Shellfish Allergy] Itching  . Sulfa Antibiotics Other (See Comments)    Reaction unknown   Past Medical History:  Diagnosis Date  . Arthritis   . Depression    ONGOING PROBLEM  . Diabetes mellitus without complication ([facility])    DX 2008   USES PILLS  . Dizziness and giddiness 06/29/2015  . GERD (gastroesophageal reflux disease)   . Hypertension   . Hypothyroidism   . Morbidly obese ([facility])   . Morton's neuroma of left foot     Current Outpatient Medications:  .  aspirin EC 81 MG tablet, Take 81 mg by mouth daily., Disp: , Rfl:  .  blood glucose meter kit and supplies  KIT, Dispense based on patient and insurance preference. Use up to four times daily as directed. (FOR ICD-9 250.00, 250.01)., Disp: 1 each, Rfl: 11 .  buPROPion (WELLBUTRIN XL) 150 MG 24 hr tablet, Take 2 tablets (300 mg total) by mouth daily., Disp: 60 tablet, Rfl: 2 .  glucose blood (ONETOUCH VERIO) test strip, Use to check blood sugars up to 4 times daily, Disp: 400 each, Rfl: 3 .  levothyroxine (SYNTHROID, LEVOTHROID) 100 MCG tablet, TAKE ONE TABLET BY MOUTH ONCE DAILY BEFORE BREAKFAST, Disp: 90 tablet, Rfl: 1 .  lisinopril (PRINIVIL,ZESTRIL) 10 MG tablet, TAKE 1 TABLET BY MOUTH ONCE DAILY, Disp: 90 tablet, Rfl: 1 .  loratadine (CLARITIN) 10 MG tablet, Take 1 tablet (10 mg total) by mouth daily., Disp: 30 tablet, Rfl: 11 .  meloxicam (MOBIC) 7.5 MG tablet, TAKE 1 TABLET BY MOUTH ONCE DAILY, Disp: 30 tablet, Rfl: 0 .  metFORMIN (GLUCOPHAGE) 1000 MG tablet, Take 1 tablet (1,000 mg total) by mouth 2 (two) times daily with a meal., Disp: 60 tablet, Rfl: 0 .  ONETOUCH DELICA LANCETS 55M MISC, Use to check blood sugars up to 4 times daily, Disp: 400 each, Rfl: 3 .  predniSONE (STERAPRED UNI-PAK 21 TAB) 10 MG (21) TBPK tablet, As directed x 6 days, Disp: 21 tablet, Rfl: 0 Social History   Socioeconomic History  . Marital status: Widowed  Spouse name: Not on file  . Number of children: 1  . Years of education: 35  . Highest education level: Not on file  Social Needs  . Financial resource strain: Not on file  . Food insecurity - worry: Not on file  . Food insecurity - inability: Not on file  . Transportation needs - medical: Not on file  . Transportation needs - non-medical: Not on file  Occupational History  . Occupation: disability  Tobacco Use  . Smoking status: Never Smoker  . Smokeless tobacco: Never Used  Substance and Sexual Activity  . Alcohol use: No  . Drug use: No  . Sexual activity: Not on file  Other Topics Concern  . Not on file  Social History Narrative   Patient  drinks 1 soda daily.   Patient is left handed.    Family History  Problem Relation Age of Onset  . Heart attack Mother   . Cancer Father        brain  . Heart failure Father   . Cancer Brother        Prostate  . Leukemia Brother   . Dementia Sister        alzheimers  . Heart disease Sister   . Diabetes Sister   . Hypertension Sister   . Hyperlipidemia Sister   . Heart failure Sister     Objective: Office vital signs reviewed. BP 125/66   Pulse 75   Temp 98 F (36.7 C) (Oral)   Ht _0  (1.473 m)   Wt 232 lb (105.2 kg)   BMI 48.49 kg/m   Physical Examination:  General: Awake, alert, obese, No acute distress HEENT: sclera white, MMM GI: soft, obese, mild epigastric TTP, non-distended, bowel sounds present x4, no hepatomegaly, no splenomegaly, no masses; no peritoneal signs.  Negative McBurney's and negative Murphy's.  Assessment/ Plan: 67 y.o. female   1. Acute gastritis without hemorrhage, unspecified gastritis type Suspect acute gastritis.  Nothing in her HPI suggest GI bleed.  We did discuss that this is likely related to use of prednisone, meloxicam and ibuprofen.  I highly recommended that she discontinue use of the meloxicam and ibuprofen.  We discussed that these are not appropriate to take together.  I offered to check her kidney function but she declined this.  I have placed her on omeprazole 20 mg daily rather than twice daily since there is no evidence of GI bleed.  She will follow-up with her PCP in 2 weeks for recheck.  If she is having persistent symptoms or symptoms have worsened, would not hesitate to have this patient evaluated by GI to rule out GI ulcer.  Home care instructions reviewed.  Handout provided.  2. Type 2 diabetes mellitus without complication, without long-term current use of insulin ([facility]) Likely has uncontrolled blood sugars given reports of persistent elevations in blood sugar despite completion of prednisone pack.  She will likely need an  additional medication.  Check A1c in anticipation for visit with PCP in 2 weeks. - Bayer DCA Hb A1c Waived   Orders Placed This Encounter  Procedures  . Bayer DCA Hb A1c Waived   Meds ordered this encounter  Medications  . omeprazole (PRILOSEC) 20 MG capsule    Sig: Take 1 capsule (20 mg total) by mouth daily.    Dispense:  30 capsule    Refill:  [place], DO [facility] [phone]

## 2017-07-15 NOTE — Patient Instructions (Addendum)
I think that you are having inflammation and increased acid production secondary to your use of ibuprofen, meloxicam and recent prednisone.  I have started you on a medication called omeprazole to take 1 tablet daily for the next 2 weeks.  I want you to follow-up with [name] in 2 weeks for recheck of your stomach issues and your blood sugar.  Make sure that you are keeping a log of your blood sugar to bring to that appointment.  You will likely need another medication to help control your diabetes.  I want you to STOP using the meloxicam and ibuprofen completely.  As we discussed, using these together can actually hurt your kidneys and cause stomach ulcers.  If you are not finding much benefit from the meloxicam then I would totally discontinue the medication.  Instead of the ibuprofen, you can be using Tylenol every 8 hours for arthritis.  Make sure that you are drinking plenty of water.   Gastritis, Adult Gastritis is swelling (inflammation) of the stomach. When you have this condition, you can have these problems (symptoms):  Pain in your stomach.  A burning feeling in your stomach.  Feeling sick to your stomach (nauseous).  Throwing up (vomiting).  Feeling too full after you eat.  It is important to get help for this condition. Without help, your stomach can bleed, and you can get sores (ulcers) in your stomach. Follow these instructions at home:  Take over-the-counter and prescription medicines only as told by your doctor.  If you were prescribed an antibiotic medicine, take it as told by your doctor. Do not stop taking it even if you start to feel better.  Drink enough fluid to keep your pee (urine) clear or pale yellow.  Instead of eating big meals, eat small meals often. Contact a health care provider if:  Your problems get worse.  Your problems go away and then come back. Get help right away if:  You throw up blood or something that looks like coffee grounds.  You have  black or dark red poop (stools).  You cannot keep fluids down.  Your stomach pain gets worse.  You have a fever.  You do not feel better after 1 week. This information is not intended to replace advice given to you by your health care provider. Make sure you discuss any questions you have with your health care provider. Document Released: 10/30/2007 Document Revised: 01/10/2016 Document Reviewed: 02/04/2015 Elsevier Interactive Patient Education  2018 [place] for Gastroesophageal Reflux Disease, Adult When you have gastroesophageal reflux disease (GERD), the foods you eat and your eating habits are very important. Choosing the right foods can help ease your discomfort. What guidelines do I need to follow?  Choose fruits, vegetables, whole grains, and low-fat dairy products.  Choose low-fat meat, fish, and poultry.  Limit fats such as oils, salad dressings, butter, nuts, and avocado.  Keep a food diary. This helps you identify foods that cause symptoms.  Avoid foods that cause symptoms. These may be different for everyone.  Eat small meals often instead of 3 large meals a day.  Eat your meals slowly, in a place where you are relaxed.  Limit fried foods.  Cook foods using methods other than frying.  Avoid drinking alcohol.  Avoid drinking large amounts of liquids with your meals.  Avoid bending over or lying down until 2-3 hours after eating. What foods are not recommended? These are some foods and drinks that may make your symptoms  worse: Vegetables Tomatoes. Tomato juice. Tomato and spaghetti sauce. Chili peppers. Onion and garlic. Horseradish. Fruits Oranges, grapefruit, and lemon (fruit and juice). Meats High-fat meats, fish, and poultry. This includes hot dogs, ribs, ham, sausage, salami, and bacon. Dairy Whole milk and chocolate milk. Sour cream. Cream. Butter. Ice cream. Cream cheese. Drinks Coffee and tea. Bubbly (carbonated) drinks or  energy drinks. Condiments Hot sauce. Barbecue sauce. Sweets/Desserts Chocolate and cocoa. Donuts. Peppermint and spearmint. Fats and Oils High-fat foods. This includes [place] fries and potato chips. Other Vinegar. Strong spices. This includes black pepper, white pepper, red pepper, cayenne, curry powder, cloves, ginger, and chili powder. The items listed above may not be a complete list of foods and drinks to avoid. Contact your dietitian for more information. This information is not intended to replace advice given to you by your health care provider. Make sure you discuss any questions you have with your health care provider. Document Released: 11/12/2011 Document Revised: 10/19/2015 Document Reviewed: 03/17/2013 Elsevier Interactive Patient Education  2017 [employer].

## 2017-07-31 DIAGNOSIS — E119 Type 2 diabetes mellitus without complications: Secondary | ICD-10-CM | POA: Diagnosis not present

## 2017-07-31 DIAGNOSIS — Z7984 Long term (current) use of oral hypoglycemic drugs: Secondary | ICD-10-CM | POA: Diagnosis not present

## 2017-07-31 DIAGNOSIS — R160 Hepatomegaly, not elsewhere classified: Secondary | ICD-10-CM | POA: Diagnosis not present

## 2017-07-31 DIAGNOSIS — R1031 Right lower quadrant pain: Secondary | ICD-10-CM | POA: Diagnosis not present

## 2017-07-31 DIAGNOSIS — Z7982 Long term (current) use of aspirin: Secondary | ICD-10-CM | POA: Diagnosis not present

## 2017-07-31 DIAGNOSIS — K439 Ventral hernia without obstruction or gangrene: Secondary | ICD-10-CM | POA: Diagnosis not present

## 2017-07-31 DIAGNOSIS — K760 Fatty (change of) liver, not elsewhere classified: Secondary | ICD-10-CM | POA: Diagnosis not present

## 2017-07-31 DIAGNOSIS — Z79899 Other long term (current) drug therapy: Secondary | ICD-10-CM | POA: Diagnosis not present

## 2017-07-31 DIAGNOSIS — R109 Unspecified abdominal pain: Secondary | ICD-10-CM | POA: Diagnosis not present

## 2017-07-31 DIAGNOSIS — I10 Essential (primary) hypertension: Secondary | ICD-10-CM | POA: Diagnosis not present

## 2017-08-07 ENCOUNTER — Telehealth: Payer: Self-pay | Admitting: Physician Assistant

## 2017-08-07 ENCOUNTER — Other Ambulatory Visit: Payer: Self-pay | Admitting: Physician Assistant

## 2017-08-07 DIAGNOSIS — F3341 Major depressive disorder, recurrent, in partial remission: Secondary | ICD-10-CM

## 2017-08-07 DIAGNOSIS — R69 Illness, unspecified: Secondary | ICD-10-CM | POA: Diagnosis not present

## 2017-08-07 MED ORDER — BUPROPION HCL ER (XL) 300 MG PO TB24
300.0000 mg | ORAL_TABLET | Freq: Every day | ORAL | 11 refills | Status: DC
Start: 2017-08-07 — End: 2017-10-08

## 2017-08-07 NOTE — Telephone Encounter (Signed)
Sent new script

## 2017-08-08 ENCOUNTER — Ambulatory Visit (INDEPENDENT_AMBULATORY_CARE_PROVIDER_SITE_OTHER): Payer: Medicare HMO | Admitting: Physician Assistant

## 2017-08-08 ENCOUNTER — Encounter: Payer: Self-pay | Admitting: Physician Assistant

## 2017-08-08 VITALS — BP 132/71 | HR 68 | Temp 97.5°F | Ht <= 58 in | Wt 230.4 lb

## 2017-08-08 DIAGNOSIS — B373 Candidiasis of vulva and vagina: Secondary | ICD-10-CM

## 2017-08-08 DIAGNOSIS — B3731 Acute candidiasis of vulva and vagina: Secondary | ICD-10-CM

## 2017-08-08 DIAGNOSIS — R413 Other amnesia: Secondary | ICD-10-CM | POA: Diagnosis not present

## 2017-08-08 DIAGNOSIS — I10 Essential (primary) hypertension: Secondary | ICD-10-CM

## 2017-08-08 DIAGNOSIS — J309 Allergic rhinitis, unspecified: Secondary | ICD-10-CM

## 2017-08-08 MED ORDER — DONEPEZIL HCL 5 MG PO TABS: 5.0000 mg | ORAL_TABLET | Freq: Every day | ORAL | 5 refills | Status: DC

## 2017-08-08 MED ORDER — FLUCONAZOLE 150 MG PO TABS: ORAL_TABLET | ORAL | 0 refills | Status: DC

## 2017-08-08 MED ORDER — LORATADINE 10 MG PO TABS: 10.0000 mg | ORAL_TABLET | Freq: Every day | ORAL | 3 refills | Status: DC

## 2017-08-08 NOTE — Patient Instructions (Signed)
In a few days you may receive a survey in the mail or online from [employer] regarding your visit with us today. Please take a moment to fill this out. Your feedback is very important to our whole office. It can help us better understand your needs as well as improve your experience and satisfaction. Thank you for taking your time to complete it. We care about you.  Valerya Maxton, PA-C  

## 2017-08-10 NOTE — Progress Notes (Signed)
BP 132/71   Pulse 68   Temp (!) 97.5 F (36.4 C) (Oral)   Ht _0  (1.473 m)   Wt 230 lb 6.4 oz (104.5 kg)   BMI 48.15 kg/m     Subjective:    Patient ID: [name], female    DOB: 1951/01/23, 67 y.o.   MRN: [id number]  HPI: [name] is a 66 y.o. female presenting on 08/08/2017 for Follow-up (2 month )  This patient comes in for periodic recheck on medications and conditions including yeast vaginitis, hypertension,.  Allergies.  They are quite bad this past spring she feels that also weeks.  He has had no long-term issue in the past.  She has had market short-term memory loss.  She can remember distantly and has no problem then but she is frequently forgetting things or that there is some family history of memory loss.  Try Aricept for about a month and have her come back and see how this is doing.  All medications are reviewed today. There are no reports of any problems with the medications. All of the medical conditions are reviewed and updated.  Lab work is reviewed and will be ordered as medically necessary. There are no new problems reported with today's visit.   Past Medical History:  Diagnosis Date  . Arthritis   . Depression    ONGOING PROBLEM  . Diabetes mellitus without complication ([place])    DX 2008   USES PILLS  . Dizziness and giddiness 06/29/2015  . GERD (gastroesophageal reflux disease)   . Hypertension   . Hypothyroidism   . Morbidly obese ([place])   . Morton's neuroma of left foot    Relevant past medical, surgical, family and social history reviewed and updated as indicated. Interim medical history since our last visit reviewed. Allergies and medications reviewed and updated. DATA REVIEWED: CHART IN EPIC  Family History reviewed for pertinent findings.  Review of Systems  Constitutional: Negative.  Negative for activity change, fatigue and fever.  HENT: Negative.   Eyes: Negative.   Respiratory: Negative.  Negative for cough.   Cardiovascular:  Negative.  Negative for chest pain.  Gastrointestinal: Negative.  Negative for abdominal pain.  Endocrine: Negative.   Genitourinary: Positive for vaginal discharge. Negative for dysuria, pelvic pain and vaginal pain.  Musculoskeletal: Negative.   Skin: Negative.   Neurological: Negative.  Negative for headaches.    Allergies as of 08/08/2017      Reactions   Penicillins Other (See Comments)   Reaction unknown   Shrimp [shellfish Allergy] Itching   Sulfa Antibiotics Other (See Comments)   Reaction unknown      Medication List        Accurate as of 08/08/17 11:59 PM. Always use your most recent med list.          aspirin EC 81 MG tablet Take 81 mg by mouth daily.   blood glucose meter kit and supplies Kit Dispense based on patient and insurance preference. Use up to four times daily as directed. (FOR ICD-9 250.00, 250.01).   buPROPion 300 MG 24 hr tablet Commonly known as:  WELLBUTRIN XL Take 1 tablet (300 mg total) by mouth daily.   donepezil 5 MG tablet Commonly known as:  ARICEPT Take 1 tablet (5 mg total) by mouth at bedtime.   fluconazole 150 MG tablet Commonly known as:  DIFLUCAN 1 po q week x 4 weeks   glucose blood test strip Commonly known as:  ONETOUCH  VERIO Use to check blood sugars up to 4 times daily   levothyroxine 100 MCG tablet Commonly known as:  SYNTHROID, LEVOTHROID TAKE ONE TABLET BY MOUTH ONCE DAILY BEFORE BREAKFAST   lisinopril 10 MG tablet Commonly known as:  PRINIVIL,ZESTRIL TAKE 1 TABLET BY MOUTH ONCE DAILY   loratadine 10 MG tablet Commonly known as:  CLARITIN Take 1 tablet (10 mg total) by mouth daily.   metFORMIN 1000 MG tablet Commonly known as:  GLUCOPHAGE Take 1 tablet (1,000 mg total) by mouth 2 (two) times daily with a meal.   omeprazole 20 MG capsule Commonly known as:  PRILOSEC Take 1 capsule (20 mg total) by mouth daily.   ONETOUCH DELICA LANCETS 15A Misc Use to check blood sugars up to 4 times daily            Objective:    BP 132/71   Pulse 68   Temp (!) 97.5 F (36.4 C) (Oral)   Ht _0  (1.473 m)   Wt 230 lb 6.4 oz (104.5 kg)   BMI 48.15 kg/m    Allergies  Allergen Reactions  . Penicillins Other (See Comments)    Reaction unknown  . Shrimp [Shellfish Allergy] Itching  . Sulfa Antibiotics Other (See Comments)    Reaction unknown    Wt Readings from Last 3 Encounters:  08/08/17 230 lb 6.4 oz (104.5 kg)  07/15/17 232 lb (105.2 kg)  06/26/17 235 lb (106.6 kg)    Physical Exam  Constitutional: She is oriented to person, place, and time. She appears well-developed and well-nourished.  HENT:  Head: Normocephalic and atraumatic.  Eyes: Conjunctivae and EOM are normal. Pupils are equal, round, and reactive to light.  Cardiovascular: Normal rate, regular rhythm, normal heart sounds and intact distal pulses.  Pulmonary/Chest: Effort normal and breath sounds normal.  Abdominal: Soft. Bowel sounds are normal.  Neurological: She is alert and oriented to person, place, and time. She has normal reflexes.  Skin: Skin is warm and dry. No rash noted.  Psychiatric: She has a normal mood and affect. Her behavior is normal. Judgment and thought content normal.  Nursing note and vitals reviewed.   Results for orders placed or performed in visit on 07/15/17  Bayer DCA Hb A1c Waived  Result Value Ref Range   Bayer DCA Hb A1c Waived 9.3 (H) <7.0 %      Assessment & Plan:   1. Memory loss - donepezil (ARICEPT) 5 MG tablet; Take 1 tablet (5 mg total) by mouth at bedtime.  Dispense: 30 tablet; Refill: 5  2. Yeast vaginitis - fluconazole (DIFLUCAN) 150 MG tablet; 1 po q week x 4 weeks  Dispense: 4 tablet; Refill: 0  3. Essential hypertension  4. Allergic rhinitis, unspecified seasonality, unspecified trigge - loratadine (CLARITIN) 10 MG tablet; Take 1 tablet (10 mg total) by mouth daily.  Dispense: 90 tablet; Refill: 3    Continue all other maintenance medications as listed  above.  Follow up plan: Return in about 2 months (around 10/08/2017) for survey.  Educational handout given for [place] PA-C [facility] [address]  [place], [place] [address] [phone]   08/10/2017, 9:46 PM

## 2017-08-12 ENCOUNTER — Encounter: Payer: Medicare HMO | Admitting: Physician Assistant

## 2017-08-13 ENCOUNTER — Encounter: Payer: Self-pay | Admitting: Physician Assistant

## 2017-09-02 ENCOUNTER — Other Ambulatory Visit: Payer: Self-pay | Admitting: Family Medicine

## 2017-09-05 ENCOUNTER — Ambulatory Visit (INDEPENDENT_AMBULATORY_CARE_PROVIDER_SITE_OTHER): Payer: Medicare HMO | Admitting: Physician Assistant

## 2017-09-05 ENCOUNTER — Encounter: Payer: Self-pay | Admitting: Physician Assistant

## 2017-09-05 VITALS — BP 135/72 | HR 77 | Temp 97.4°F | Ht <= 58 in | Wt 231.0 lb

## 2017-09-05 DIAGNOSIS — W57XXXA Bitten or stung by nonvenomous insect and other nonvenomous arthropods, initial encounter: Secondary | ICD-10-CM

## 2017-09-05 DIAGNOSIS — S80921A Unspecified superficial injury of right lower leg, initial encounter: Secondary | ICD-10-CM

## 2017-09-05 MED ORDER — DOXYCYCLINE HYCLATE 100 MG PO TABS: 100.0000 mg | ORAL_TABLET | Freq: Two times a day (BID) | ORAL | 0 refills | Status: DC

## 2017-09-05 NOTE — Progress Notes (Signed)
BP 135/72   Pulse 77   Temp (!) 97.4 F (36.3 C) (Oral)   Ht '4\' 10"'  (1.473 m)   Wt 231 lb (104.8 kg)   BMI 48.28 kg/m    Subjective:    Patient ID: [name], female    DOB: 1951/03/04, 67 y.o.   MRN: [id number]  HPI: [name] is a 67 y.o. female presenting on 09/05/2017 for Insect Bite (tick)  3 days ago the patient got a deer tick off of her right lower leg.  It has continued to swell and severely itch  She denies any fever or chills.  There is no other rash associated with it.  The redness has spread on her leg.  She is currently taking Claritin orally  Past Medical History:  Diagnosis Date  . Arthritis   . Depression    ONGOING PROBLEM  . Diabetes mellitus without complication ([place])    DX 2008   USES PILLS  . Dizziness and giddiness 06/29/2015  . GERD (gastroesophageal reflux disease)   . Hypertension   . Hypothyroidism   . Morbidly obese ([place])   . Morton's neuroma of left foot    Relevant past medical, surgical, family and social history reviewed and updated as indicated. Interim medical history since our last visit reviewed. Allergies and medications reviewed and updated. DATA REVIEWED: CHART IN EPIC  Family History reviewed for pertinent findings.  Review of Systems  Constitutional: Negative.  Negative for fatigue and fever.  HENT: Negative.   Eyes: Negative.   Respiratory: Negative.   Gastrointestinal: Negative.   Genitourinary: Negative.   Skin: Positive for color change and wound.    Allergies as of 09/05/2017      Reactions   Penicillins Other (See Comments)   Reaction unknown   Shrimp [shellfish Allergy] Itching   Sulfa Antibiotics Other (See Comments)   Reaction unknown      Medication List        Accurate as of 09/05/17  4:14 PM. Always use your most recent med list.          aspirin EC 81 MG tablet Take 81 mg by mouth daily.   blood glucose meter kit and supplies Kit Dispense based on patient and insurance  preference. Use up to four times daily as directed. (FOR ICD-9 250.00, 250.01).   buPROPion 300 MG 24 hr tablet Commonly known as:  WELLBUTRIN XL Take 1 tablet (300 mg total) by mouth daily.   donepezil 5 MG tablet Commonly known as:  ARICEPT Take 1 tablet (5 mg total) by mouth at bedtime.   doxycycline 100 MG tablet Commonly known as:  VIBRA-TABS Take 1 tablet (100 mg total) by mouth 2 (two) times daily. 1 po bid   fluconazole 150 MG tablet Commonly known as:  DIFLUCAN 1 po q week x 4 weeks   glucose blood test strip Commonly known as:  ONETOUCH VERIO Use to check blood sugars up to 4 times daily   levothyroxine 100 MCG tablet Commonly known as:  SYNTHROID, LEVOTHROID TAKE ONE TABLET BY MOUTH ONCE DAILY BEFORE BREAKFAST   lisinopril 10 MG tablet Commonly known as:  PRINIVIL,ZESTRIL TAKE 1 TABLET BY MOUTH ONCE DAILY   loratadine 10 MG tablet Commonly known as:  CLARITIN Take 1 tablet (10 mg total) by mouth daily.   metFORMIN 1000 MG tablet Commonly known as:  GLUCOPHAGE Take 1 tablet (1,000 mg total) by mouth 2 (two) times daily with a meal.  omeprazole 20 MG capsule Commonly known as:  PRILOSEC TAKE 1 CAPSULE BY MOUTH ONCE DAILY   ONETOUCH DELICA LANCETS 54Y Misc Use to check blood sugars up to 4 times daily          Objective:    BP 135/72   Pulse 77   Temp (!) 97.4 F (36.3 C) (Oral)   Ht '4\' 10"'  (1.473 m)   Wt 231 lb (104.8 kg)   BMI 48.28 kg/m   Allergies  Allergen Reactions  . Penicillins Other (See Comments)    Reaction unknown  . Shrimp [Shellfish Allergy] Itching  . Sulfa Antibiotics Other (See Comments)    Reaction unknown    Wt Readings from Last 3 Encounters:  09/05/17 231 lb (104.8 kg)  08/08/17 230 lb 6.4 oz (104.5 kg)  07/15/17 232 lb (105.2 kg)    Physical Exam  Constitutional: She is oriented to person, place, and time. She appears well-developed and well-nourished.  HENT:  Head: Normocephalic and atraumatic.  Eyes: Pupils  are equal, round, and reactive to light. Conjunctivae and EOM are normal.  Cardiovascular: Normal rate, regular rhythm, normal heart sounds and intact distal pulses.  Pulmonary/Chest: Effort normal and breath sounds normal.  Abdominal: Soft. Bowel sounds are normal.  Neurological: She is alert and oriented to person, place, and time. She has normal reflexes.  Skin: Skin is warm and dry. Rash noted. Rash is maculopapular. There is erythema.     Central bite with surrounding erythema  Psychiatric: She has a normal mood and affect. Her behavior is normal. Judgment and thought content normal.        Assessment & Plan:   1. Tick bite, initial encounter - doxycycline (VIBRA-TABS) 100 MG tablet; Take 1 tablet (100 mg total) by mouth 2 (two) times daily. 1 po bid  Dispense: 20 tablet; Refill: 0    Continue all other maintenance medications as listed above.  Follow up plan: Return if symptoms worsen or fail to improve.  Educational handout given for tick bite  Terald Sleeper PA-C [facility] [address]  [place], [place] [address] [phone]   09/05/2017, 4:14 PM

## 2017-09-05 NOTE — Patient Instructions (Signed)

## 2017-09-13 ENCOUNTER — Other Ambulatory Visit: Payer: Self-pay | Admitting: Physician Assistant

## 2017-09-13 DIAGNOSIS — E119 Type 2 diabetes mellitus without complications: Secondary | ICD-10-CM

## 2017-09-23 DIAGNOSIS — Z1231 Encounter for screening mammogram for malignant neoplasm of breast: Secondary | ICD-10-CM | POA: Diagnosis not present

## 2017-09-23 LAB — HM MAMMOGRAPHY

## 2017-09-30 DIAGNOSIS — R69 Illness, unspecified: Secondary | ICD-10-CM | POA: Diagnosis not present

## 2017-10-08 ENCOUNTER — Encounter: Payer: Self-pay | Admitting: Physician Assistant

## 2017-10-08 ENCOUNTER — Ambulatory Visit (INDEPENDENT_AMBULATORY_CARE_PROVIDER_SITE_OTHER): Payer: Medicare HMO | Admitting: Physician Assistant

## 2017-10-08 VITALS — BP 128/69 | HR 55 | Temp 96.8°F | Ht <= 58 in | Wt 226.0 lb

## 2017-10-08 DIAGNOSIS — E7800 Pure hypercholesterolemia, unspecified: Secondary | ICD-10-CM | POA: Diagnosis not present

## 2017-10-08 DIAGNOSIS — E119 Type 2 diabetes mellitus without complications: Secondary | ICD-10-CM

## 2017-10-08 DIAGNOSIS — I10 Essential (primary) hypertension: Secondary | ICD-10-CM

## 2017-10-08 DIAGNOSIS — E039 Hypothyroidism, unspecified: Secondary | ICD-10-CM | POA: Diagnosis not present

## 2017-10-08 LAB — BAYER DCA HB A1C WAIVED: HB A1C (BAYER DCA - WAIVED): 8.7 % — ABNORMAL HIGH (ref <7.0)

## 2017-10-08 MED ORDER — LISINOPRIL 10 MG PO TABS: 10.0000 mg | ORAL_TABLET | Freq: Every day | ORAL | 3 refills | Status: DC

## 2017-10-08 MED ORDER — METFORMIN HCL 1000 MG PO TABS: ORAL_TABLET | ORAL | 5 refills | Status: DC

## 2017-10-08 MED ORDER — OMEPRAZOLE 20 MG PO CPDR: 20.0000 mg | DELAYED_RELEASE_CAPSULE | Freq: Every day | ORAL | 3 refills | Status: DC

## 2017-10-08 MED ORDER — BUPROPION HCL ER (SR) 150 MG PO TB12: 150.0000 mg | ORAL_TABLET | Freq: Two times a day (BID) | ORAL | 3 refills | Status: DC

## 2017-10-08 NOTE — Patient Instructions (Signed)
In a few days you may receive a survey in the mail or online from [employer] regarding your visit with us today. Please take a moment to fill this out. Your feedback is very important to our whole office. It can help us better understand your needs as well as improve your experience and satisfaction. Thank you for taking your time to complete it. We care about you.  Anallely Rosell, PA-C  

## 2017-10-09 LAB — CMP14+EGFR
ALK PHOS: 58 IU/L (ref 39–117)
ALT: 28 IU/L (ref 0–32)
AST: 25 IU/L (ref 0–40)
Albumin/Globulin Ratio: 1.6 (ref 1.2–2.2)
Albumin: 4 g/dL (ref 3.6–4.8)
BUN/Creatinine Ratio: 15 (ref 12–28)
BUN: 11 mg/dL (ref 8–27)
Bilirubin Total: 0.3 mg/dL (ref 0.0–1.2)
CO2: 22 mmol/L (ref 20–29)
Calcium: 9.8 mg/dL (ref 8.7–10.3)
Chloride: 101 mmol/L (ref 96–106)
Creatinine, Ser: 0.74 mg/dL (ref 0.57–1.00)
GFR calc Af Amer: 98 mL/min/{1.73_m2} (ref >59)
GFR calc non Af Amer: 85 mL/min/{1.73_m2} (ref >59)
GLUCOSE: 164 mg/dL — AB (ref 65–99)
Globulin, Total: 2.5 g/dL (ref 1.5–4.5)
Potassium: 4.3 mmol/L (ref 3.5–5.2)
Sodium: 141 mmol/L (ref 134–144)
Total Protein: 6.5 g/dL (ref 6.0–8.5)

## 2017-10-09 LAB — CBC WITH DIFFERENTIAL/PLATELET
BASOS ABS: 0 10*3/uL (ref 0.0–0.2)
Basos: 0 %
EOS (ABSOLUTE): 0.1 10*3/uL (ref 0.0–0.4)
Eos: 2 %
HEMATOCRIT: 38 % (ref 34.0–46.6)
HEMOGLOBIN: 12.2 g/dL (ref 11.1–15.9)
Immature Grans (Abs): 0 10*3/uL (ref 0.0–0.1)
Immature Granulocytes: 1 %
LYMPHS ABS: 2.5 10*3/uL (ref 0.7–3.1)
Lymphs: 30 %
MCH: 29.3 pg (ref 26.6–33.0)
MCHC: 32.1 g/dL (ref 31.5–35.7)
MCV: 91 fL (ref 79–97)
MONOCYTES: 5 %
MONOS ABS: 0.4 10*3/uL (ref 0.1–0.9)
NEUTROS ABS: 5.2 10*3/uL (ref 1.4–7.0)
Neutrophils: 62 %
Platelets: 246 10*3/uL (ref 150–379)
RBC: 4.16 x10E6/uL (ref 3.77–5.28)
RDW: 15.4 % (ref 12.3–15.4)
WBC: 8.2 10*3/uL (ref 3.4–10.8)

## 2017-10-09 LAB — LIPID PANEL
CHOLESTEROL TOTAL: 158 mg/dL (ref 100–199)
Chol/HDL Ratio: 3.7 ratio (ref 0.0–4.4)
HDL: 43 mg/dL (ref >39)
LDL Calculated: 87 mg/dL (ref 0–99)
Triglycerides: 138 mg/dL (ref 0–149)
VLDL CHOLESTEROL CAL: 28 mg/dL (ref 5–40)

## 2017-10-09 LAB — TSH: TSH: 0.882 u[IU]/mL (ref 0.450–4.500)

## 2017-10-10 NOTE — Progress Notes (Signed)
BP 128/69   Pulse (!) 55   Temp (!) 96.8 F (36 C) (Oral)   Ht '4\' 10"'  (1.473 m)   Wt 226 lb (102.5 kg)   BMI 47.23 kg/m    Subjective:    Patient ID: [name], female    DOB: 01-27-1951, 67 y.o.   MRN: [id number]  HPI: [name] is a 67 y.o. female presenting on 10/08/2017 for Medical Management of Chronic Issues  This patient comes in for a recheck on her chronic medical conditions which include essential hypertension, type 2 diabetes, hypothyroidism and elevated cholesterol.  She does need labs performed today.  She states overall she is feeling okay.  She does have a lot of stress going on in her life she denies any chest pain shortness of breath or nausea vomiting or diarrhea.  She is  Past Medical History:  Diagnosis Date  . Arthritis   . Depression    ONGOING PROBLEM  . Diabetes mellitus without complication ([place])    DX 2008   USES PILLS  . Dizziness and giddiness 06/29/2015  . GERD (gastroesophageal reflux disease)   . Hypertension   . Hypothyroidism   . Morbidly obese ([place])   . Morton's neuroma of left foot    Relevant past medical, surgical, family and social history reviewed and updated as indicated. Interim medical history since our last visit reviewed. Allergies and medications reviewed and updated. DATA REVIEWED: CHART IN EPIC  Family History reviewed for pertinent findings.  Review of Systems  Constitutional: Negative.   HENT: Negative.   Eyes: Negative.   Respiratory: Negative.   Gastrointestinal: Negative.   Genitourinary: Negative.   Musculoskeletal: Positive for arthralgias, back pain and myalgias.    Allergies as of 10/08/2017      Reactions   Penicillins Other (See Comments)   Reaction unknown   Shrimp [shellfish Allergy] Itching   Sulfa Antibiotics Other (See Comments)   Reaction unknown      Medication List        Accurate as of 10/08/17 11:59 PM. Always use your most recent med list.          aspirin EC 81 MG  tablet Take 81 mg by mouth daily.   blood glucose meter kit and supplies Kit Dispense based on patient and insurance preference. Use up to four times daily as directed. (FOR ICD-9 250.00, 250.01).   buPROPion 150 MG 12 hr tablet Commonly known as:  WELLBUTRIN SR Take 1 tablet (150 mg total) by mouth 2 (two) times daily.   glucose blood test strip Commonly known as:  ONETOUCH VERIO Use to check blood sugars up to 4 times daily   levothyroxine 100 MCG tablet Commonly known as:  SYNTHROID, LEVOTHROID TAKE ONE TABLET BY MOUTH ONCE DAILY BEFORE BREAKFAST   lisinopril 10 MG tablet Commonly known as:  PRINIVIL,ZESTRIL Take 1 tablet (10 mg total) by mouth daily.   loratadine 10 MG tablet Commonly known as:  CLARITIN Take 1 tablet (10 mg total) by mouth daily.   metFORMIN 1000 MG tablet Commonly known as:  GLUCOPHAGE TAKE 1 TABLET BY MOUTH TWICE DAILY WITH A MEAL   omeprazole 20 MG capsule Commonly known as:  PRILOSEC Take 1 capsule (20 mg total) by mouth daily.   ONETOUCH DELICA LANCETS 14E Misc Use to check blood sugars up to 4 times daily          Objective:    BP 128/69   Pulse (!) 55  Temp (!) 96.8 F (36 C) (Oral)   Ht '4\' 10"'  (1.473 m)   Wt 226 lb (102.5 kg)   BMI 47.23 kg/m   Allergies  Allergen Reactions  . Penicillins Other (See Comments)    Reaction unknown  . Shrimp [Shellfish Allergy] Itching  . Sulfa Antibiotics Other (See Comments)    Reaction unknown    Wt Readings from Last 3 Encounters:  10/08/17 226 lb (102.5 kg)  09/05/17 231 lb (104.8 kg)  08/08/17 230 lb 6.4 oz (104.5 kg)    Physical Exam  Constitutional: She is oriented to person, place, and time. She appears well-developed and well-nourished.  HENT:  Head: Normocephalic and atraumatic.  Right Ear: Tympanic membrane, external ear and ear canal normal.  Left Ear: Tympanic membrane, external ear and ear canal normal.  Nose: Nose normal. No rhinorrhea.  Mouth/Throat: Oropharynx is  clear and moist and mucous membranes are normal. No oropharyngeal exudate or posterior oropharyngeal erythema.  Eyes: Pupils are equal, round, and reactive to light. Conjunctivae and EOM are normal.  Neck: Normal range of motion. Neck supple.  Cardiovascular: Normal rate, regular rhythm, normal heart sounds and intact distal pulses.  Pulmonary/Chest: Effort normal and breath sounds normal.  Abdominal: Soft. Bowel sounds are normal.  Neurological: She is alert and oriented to person, place, and time. She has normal reflexes.  Skin: Skin is warm and dry. No rash noted.  Psychiatric: She has a normal mood and affect. Her behavior is normal. Judgment and thought content normal.    Results for orders placed or performed in visit on 10/08/17  CBC with Differential/Platelet  Result Value Ref Range   WBC 8.2 3.4 - 10.8 x10E3/uL   RBC 4.16 3.77 - 5.28 x10E6/uL   Hemoglobin 12.2 11.1 - 15.9 g/dL   Hematocrit 38.0 34.0 - 46.6 %   MCV 91 79 - 97 fL   MCH 29.3 26.6 - 33.0 pg   MCHC 32.1 31.5 - 35.7 g/dL   RDW 15.4 12.3 - 15.4 %   Platelets 246 150 - 379 x10E3/uL   Neutrophils 62 Not Estab. %   Lymphs 30 Not Estab. %   Monocytes 5 Not Estab. %   Eos 2 Not Estab. %   Basos 0 Not Estab. %   Neutrophils Absolute 5.2 1.4 - 7.0 x10E3/uL   Lymphocytes Absolute 2.5 0.7 - 3.1 x10E3/uL   Monocytes Absolute 0.4 0.1 - 0.9 x10E3/uL   EOS (ABSOLUTE) 0.1 0.0 - 0.4 x10E3/uL   Basophils Absolute 0.0 0.0 - 0.2 x10E3/uL   Immature Granulocytes 1 Not Estab. %   Immature Grans (Abs) 0.0 0.0 - 0.1 x10E3/uL  CMP14+EGFR  Result Value Ref Range   Glucose 164 (H) 65 - 99 mg/dL   BUN 11 8 - 27 mg/dL   Creatinine, Ser 0.74 0.57 - 1.00 mg/dL   GFR calc non Af Amer 85 >59 mL/min/1.73   GFR calc Af Amer 98 >59 mL/min/1.73   BUN/Creatinine Ratio 15 12 - 28   Sodium 141 134 - 144 mmol/L   Potassium 4.3 3.5 - 5.2 mmol/L   Chloride 101 96 - 106 mmol/L   CO2 22 20 - 29 mmol/L   Calcium 9.8 8.7 - 10.3 mg/dL   Total  Protein 6.5 6.0 - 8.5 g/dL   Albumin 4.0 3.6 - 4.8 g/dL   Globulin, Total 2.5 1.5 - 4.5 g/dL   Albumin/Globulin Ratio 1.6 1.2 - 2.2   Bilirubin Total 0.3 0.0 - 1.2 mg/dL   Alkaline Phosphatase  58 39 - 117 IU/L   AST 25 0 - 40 IU/L   ALT 28 0 - 32 IU/L  Lipid panel  Result Value Ref Range   Cholesterol, Total 158 100 - 199 mg/dL   Triglycerides 138 0 - 149 mg/dL   HDL 43 >39 mg/dL   VLDL Cholesterol Cal 28 5 - 40 mg/dL   LDL Calculated 87 0 - 99 mg/dL   Chol/HDL Ratio 3.7 0.0 - 4.4 ratio  Bayer DCA Hb A1c Waived  Result Value Ref Range   Bayer DCA Hb A1c Waived 8.7 (H) <7.0 %  TSH  Result Value Ref Range   TSH 0.882 0.450 - 4.500 uIU/mL      Assessment & Plan:   1. Essential hypertension - CBC with Differential/Platelet - CMP14+EGFR - lisinopril (PRINIVIL,ZESTRIL) 10 MG tablet; Take 1 tablet (10 mg total) by mouth daily.  Dispense: 90 tablet; Refill: 3  2. Type 2 diabetes mellitus without complication, without long-term current use of insulin ([facility]) - CBC with Differential/Platelet - CMP14+EGFR - Bayer DCA Hb A1c Waived - metFORMIN (GLUCOPHAGE) 1000 MG tablet; TAKE 1 TABLET BY MOUTH TWICE DAILY WITH A MEAL  Dispense: 60 tablet; Refill: 5  3. Hypothyroidism, unspecified type - TSH  4. Elevated cholesterol - Lipid panel   Continue all other maintenance medications as listed above.  Follow up plan: Return in about 3 months (around 01/08/2018) for recheck.  Educational handout given for [place] PA-C [facility] [address]  [place], [place] [address] [phone]   10/10/2017, 1:41 PM

## 2017-10-15 ENCOUNTER — Other Ambulatory Visit: Payer: Self-pay | Admitting: Physician Assistant

## 2017-10-15 DIAGNOSIS — E039 Hypothyroidism, unspecified: Secondary | ICD-10-CM

## 2017-12-11 ENCOUNTER — Other Ambulatory Visit: Payer: Self-pay | Admitting: Family Medicine

## 2017-12-11 DIAGNOSIS — R69 Illness, unspecified: Secondary | ICD-10-CM | POA: Diagnosis not present

## 2017-12-18 ENCOUNTER — Encounter: Payer: Self-pay | Admitting: Family

## 2017-12-18 ENCOUNTER — Ambulatory Visit (INDEPENDENT_AMBULATORY_CARE_PROVIDER_SITE_OTHER): Payer: Medicare HMO | Admitting: Family

## 2017-12-18 VITALS — BP 131/81 | HR 99 | Temp 98.9°F | Ht <= 58 in | Wt 224.0 lb

## 2017-12-18 DIAGNOSIS — J0110 Acute frontal sinusitis, unspecified: Secondary | ICD-10-CM

## 2017-12-18 DIAGNOSIS — R509 Fever, unspecified: Secondary | ICD-10-CM | POA: Diagnosis not present

## 2017-12-18 LAB — RAPID STREP SCREEN (MED CTR MEBANE ONLY): Strep Gp A Ag, IA W/Reflex: NEGATIVE

## 2017-12-18 LAB — CULTURE, GROUP A STREP

## 2017-12-18 MED ORDER — DOXYCYCLINE HYCLATE 100 MG PO TABS: 100.0000 mg | ORAL_TABLET | Freq: Two times a day (BID) | ORAL | 0 refills | Status: DC

## 2017-12-18 NOTE — Patient Instructions (Signed)

## 2017-12-18 NOTE — Progress Notes (Signed)
   Subjective:    Patient ID: [name], female    DOB: 1950-11-05, 67 y.o.   MRN: [id number]  Chief Complaint  Patient presents with  . muscle aches  . Chills  . Headache    Headache   This is a new problem. The current episode started in the past 7 days. The problem occurs constantly. The problem has been waxing and waning. The pain is located in the occipital and frontal region. The pain does not radiate. The pain quality is similar to prior headaches. The pain is at a severity of 4/10. The pain is mild. Associated symptoms include ear pain and sinus pressure. Pertinent negatives include no sore throat.  Sinusitis  This is a new problem. The current episode started in the past 7 days. The problem has been gradually worsening since onset. There has been no fever. Her pain is at a severity of 4/10. The pain is mild. Associated symptoms include congestion, ear pain, headaches, a hoarse voice and sinus pressure. Pertinent negatives include no sore throat. Past treatments include acetaminophen. The treatment provided mild relief.      Review of Systems  HENT: Positive for congestion, ear pain, hoarse voice and sinus pressure. Negative for sore throat.   Neurological: Positive for headaches.  All other systems reviewed and are negative.      Objective:   Physical Exam  Constitutional: She is oriented to person, place, and time. She appears well-developed and well-nourished. No distress.  HENT:  Head: Normocephalic and atraumatic.  Right Ear: External ear normal.  Nose: Mucosal edema and rhinorrhea present. Right sinus exhibits frontal sinus tenderness. Left sinus exhibits frontal sinus tenderness.  Mouth/Throat: Oropharynx is clear and moist.  Eyes: Pupils are equal, round, and reactive to light.  Neck: Normal range of motion. Neck supple. No thyromegaly present.  Cardiovascular: Normal rate, regular rhythm, normal heart sounds and intact distal pulses.  No murmur  heard. Pulmonary/Chest: Effort normal and breath sounds normal. No respiratory distress. She has no wheezes.  Abdominal: Soft. Bowel sounds are normal. She exhibits no distension. There is no tenderness.  Musculoskeletal: Normal range of motion. She exhibits no edema or tenderness.  Neurological: She is alert and oriented to person, place, and time. She has normal reflexes. No cranial nerve deficit.  Skin: Skin is warm and dry.  Psychiatric: She has a normal mood and affect. Her behavior is normal. Judgment and thought content normal.  Vitals reviewed.     BP 131/81   Pulse 99   Temp 98.9 F (37.2 C) (Oral)   Ht 4\' 10"  (1.473 m)   Wt 224 lb (101.6 kg)   BMI 46.82 kg/m      Assessment & Plan:  [name] was seen today for muscle aches, chills and headache.  Diagnoses and all orders for this visit:  Fever, unspecified fever cause -     Rapid Strep Screen ([facility] ONLY)  Acute frontal sinusitis, recurrence not specified -     doxycycline (VIBRA-TABS) 100 MG tablet; Take 1 tablet (100 mg total) by mouth 2 (two) times daily.   - Take meds as prescribed - Use a cool mist humidifier  -Use saline nose sprays frequently -Force fluids -For any cough or congestion  Use plain Mucinex- regular strength or max strength is fine -For fever or aces or pains- take tylenol or ibuprofen. -Throat lozenges if help -New toothbrush in 3 days   Evelina Dun, FNP

## 2017-12-22 ENCOUNTER — Other Ambulatory Visit: Payer: Self-pay | Admitting: Physician Assistant

## 2017-12-22 DIAGNOSIS — F3341 Major depressive disorder, recurrent, in partial remission: Secondary | ICD-10-CM

## 2018-01-09 DIAGNOSIS — R69 Illness, unspecified: Secondary | ICD-10-CM | POA: Diagnosis not present

## 2018-01-21 ENCOUNTER — Ambulatory Visit (INDEPENDENT_AMBULATORY_CARE_PROVIDER_SITE_OTHER): Payer: Medicare HMO | Admitting: Physician Assistant

## 2018-01-21 ENCOUNTER — Encounter: Payer: Self-pay | Admitting: Physician Assistant

## 2018-01-21 VITALS — BP 142/78 | HR 81 | Temp 97.2°F | Ht <= 58 in | Wt 220.0 lb

## 2018-01-21 DIAGNOSIS — N898 Other specified noninflammatory disorders of vagina: Secondary | ICD-10-CM

## 2018-01-21 DIAGNOSIS — E119 Type 2 diabetes mellitus without complications: Secondary | ICD-10-CM

## 2018-01-21 DIAGNOSIS — B356 Tinea cruris: Secondary | ICD-10-CM

## 2018-01-21 LAB — WET PREP FOR TRICH, YEAST, CLUE
Clue Cell Exam: NEGATIVE
Trichomonas Exam: NEGATIVE
Yeast Exam: POSITIVE — AB

## 2018-01-21 LAB — BAYER DCA HB A1C WAIVED: HB A1C: 8.2 % — AB (ref <7.0)

## 2018-01-21 MED ORDER — AZITHROMYCIN 250 MG PO TABS: ORAL_TABLET | ORAL | 0 refills | Status: DC

## 2018-01-21 MED ORDER — NYSTATIN 100000 UNIT/GM EX CREA: 1.0000 "application " | TOPICAL_CREAM | Freq: Two times a day (BID) | CUTANEOUS | 0 refills | Status: DC

## 2018-01-21 MED ORDER — FLUCONAZOLE 150 MG PO TABS: ORAL_TABLET | ORAL | 0 refills | Status: DC

## 2018-01-22 LAB — CBC WITH DIFFERENTIAL/PLATELET
BASOS ABS: 0 10*3/uL (ref 0.0–0.2)
Basos: 1 %
EOS (ABSOLUTE): 0.1 10*3/uL (ref 0.0–0.4)
Eos: 1 %
Hematocrit: 35.5 % (ref 34.0–46.6)
Hemoglobin: 11.4 g/dL (ref 11.1–15.9)
IMMATURE GRANULOCYTES: 0 %
Immature Grans (Abs): 0 10*3/uL (ref 0.0–0.1)
LYMPHS ABS: 2.5 10*3/uL (ref 0.7–3.1)
Lymphs: 32 %
MCH: 27.7 pg (ref 26.6–33.0)
MCHC: 32.1 g/dL (ref 31.5–35.7)
MCV: 86 fL (ref 79–97)
MONOS ABS: 0.5 10*3/uL (ref 0.1–0.9)
Monocytes: 6 %
NEUTROS PCT: 60 %
Neutrophils Absolute: 4.7 10*3/uL (ref 1.4–7.0)
PLATELETS: 240 10*3/uL (ref 150–450)
RBC: 4.12 x10E6/uL (ref 3.77–5.28)
RDW: 16.2 % — AB (ref 12.3–15.4)
WBC: 7.8 10*3/uL (ref 3.4–10.8)

## 2018-01-22 LAB — CMP14+EGFR
A/G RATIO: 1.5 (ref 1.2–2.2)
ALBUMIN: 3.9 g/dL (ref 3.6–4.8)
ALK PHOS: 58 IU/L (ref 39–117)
ALT: 34 IU/L — ABNORMAL HIGH (ref 0–32)
AST: 27 IU/L (ref 0–40)
BILIRUBIN TOTAL: 0.2 mg/dL (ref 0.0–1.2)
BUN / CREAT RATIO: 20 (ref 12–28)
BUN: 18 mg/dL (ref 8–27)
CHLORIDE: 102 mmol/L (ref 96–106)
CO2: 23 mmol/L (ref 20–29)
Calcium: 9.1 mg/dL (ref 8.7–10.3)
Creatinine, Ser: 0.88 mg/dL (ref 0.57–1.00)
GFR calc Af Amer: 80 mL/min/{1.73_m2} (ref >59)
GFR calc non Af Amer: 69 mL/min/{1.73_m2} (ref >59)
GLOBULIN, TOTAL: 2.6 g/dL (ref 1.5–4.5)
GLUCOSE: 219 mg/dL — AB (ref 65–99)
POTASSIUM: 4.9 mmol/L (ref 3.5–5.2)
SODIUM: 140 mmol/L (ref 134–144)
Total Protein: 6.5 g/dL (ref 6.0–8.5)

## 2018-01-26 NOTE — Progress Notes (Signed)
BP (!) 142/78   Pulse 81   Temp (!) 97.2 F (36.2 C) (Oral)   Ht '4\' 10"'  (1.473 m)   Wt 220 lb (99.8 kg)   BMI 45.98 kg/m    Subjective:    Patient ID: [name], female    DOB: 05/14/1951, 67 y.o.   MRN: [id number]  HPI: [name] is a 67 y.o. female presenting on 01/21/2018 for Vaginitis and Sore Throat  This patient comes in for periodic recheck on medications and conditions including vaginal itching and upper respiratory symptoms.  She also has a rash on her groin on both sides it is very itching and even painful at times.  Is worse when she is more sweaty.  She also needs to have some labs performed for her type 2 diabetes.  She does not use insulin.  She has no other complaints today..   All medications are reviewed today. There are no reports of any problems with the medications. All of the medical conditions are reviewed and updated.  Lab work is reviewed and will be ordered as medically necessary. There are no new problems reported with today's visit.   Past Medical History:  Diagnosis Date  . Arthritis   . Depression    ONGOING PROBLEM  . Diabetes mellitus without complication ([place])    DX 2008   USES PILLS  . Dizziness and giddiness 06/29/2015  . GERD (gastroesophageal reflux disease)   . Hypertension   . Hypothyroidism   . Morbidly obese ([place])   . Morton's neuroma of left foot    Relevant past medical, surgical, family and social history reviewed and updated as indicated. Interim medical history since our last visit reviewed. Allergies and medications reviewed and updated. DATA REVIEWED: CHART IN EPIC  Family History reviewed for pertinent findings.  Review of Systems  Constitutional: Positive for chills and fatigue. Negative for activity change and appetite change.  HENT: Positive for congestion, postnasal drip and sore throat.   Eyes: Negative.   Respiratory: Positive for cough and wheezing.   Cardiovascular: Negative.  Negative for chest pain,  palpitations and leg swelling.  Gastrointestinal: Negative.   Genitourinary: Negative.   Musculoskeletal: Negative.   Skin: Positive for color change and rash.  Neurological: Positive for headaches.    Allergies as of 01/21/2018      Reactions   Penicillins Other (See Comments)   Reaction unknown   Shrimp [shellfish Allergy] Itching   Sulfa Antibiotics Other (See Comments)   Reaction unknown      Medication List        Accurate as of 01/21/18 11:59 PM. Always use your most recent med list.          aspirin EC 81 MG tablet Take 81 mg by mouth daily.   azithromycin 250 MG tablet Commonly known as:  ZITHROMAX Take as directed   blood glucose meter kit and supplies Kit Dispense based on patient and insurance preference. Use up to four times daily as directed. (FOR ICD-9 250.00, 250.01).   buPROPion 150 MG 24 hr tablet Commonly known as:  WELLBUTRIN XL TAKE 2 TABLETS BY MOUTH ONCE DAILY   doxycycline 100 MG tablet Commonly known as:  VIBRA-TABS Take 1 tablet (100 mg total) by mouth 2 (two) times daily.   fluconazole 150 MG tablet Commonly known as:  DIFLUCAN 1 po q week x 4 weeks   glucose blood test strip Use to check blood sugars up to 4 times daily  levothyroxine 100 MCG tablet Commonly known as:  SYNTHROID, LEVOTHROID TAKE 1 TABLET BY MOUTH ONCE DAILY BEFORE BREAKFAST   lisinopril 10 MG tablet Commonly known as:  PRINIVIL,ZESTRIL Take 1 tablet (10 mg total) by mouth daily.   loratadine 10 MG tablet Commonly known as:  CLARITIN Take 1 tablet (10 mg total) by mouth daily.   metFORMIN 1000 MG tablet Commonly known as:  GLUCOPHAGE TAKE 1 TABLET BY MOUTH TWICE DAILY WITH A MEAL   nystatin cream Commonly known as:  MYCOSTATIN Apply 1 application topically 2 (two) times daily.   omeprazole 20 MG capsule Commonly known as:  PRILOSEC TAKE 1 CAPSULE BY MOUTH ONCE DAILY   ONETOUCH DELICA LANCETS 90X Misc Use to check blood sugars up to 4 times daily            Objective:    BP (!) 142/78   Pulse 81   Temp (!) 97.2 F (36.2 C) (Oral)   Ht '4\' 10"'  (1.473 m)   Wt 220 lb (99.8 kg)   BMI 45.98 kg/m   Allergies  Allergen Reactions  . Penicillins Other (See Comments)    Reaction unknown  . Shrimp [Shellfish Allergy] Itching  . Sulfa Antibiotics Other (See Comments)    Reaction unknown    Wt Readings from Last 3 Encounters:  01/21/18 220 lb (99.8 kg)  12/18/17 224 lb (101.6 kg)  10/08/17 226 lb (102.5 kg)    Physical Exam  Constitutional: She is oriented to person, place, and time. She appears well-developed and well-nourished.  HENT:  Head: Normocephalic and atraumatic.  Right Ear: A middle ear effusion is present.  Left Ear: A middle ear effusion is present.  Nose: Mucosal edema present. Right sinus exhibits no frontal sinus tenderness. Left sinus exhibits no frontal sinus tenderness.  Mouth/Throat: Posterior oropharyngeal erythema present. No oropharyngeal exudate or tonsillar abscesses.  Eyes: Pupils are equal, round, and reactive to light. Conjunctivae and EOM are normal.  Neck: Normal range of motion.  Cardiovascular: Normal rate, regular rhythm, normal heart sounds and intact distal pulses.  Pulmonary/Chest: Effort normal and breath sounds normal.  Abdominal: Soft. Bowel sounds are normal.  Neurological: She is alert and oriented to person, place, and time. She has normal reflexes.  Skin: Skin is warm and dry. Rash noted. Rash is macular. There is erythema.     Psychiatric: She has a normal mood and affect. Her behavior is normal. Judgment and thought content normal.  Nursing note and vitals reviewed.   Results for orders placed or performed in visit on 01/21/18  WET PREP FOR [place], YEAST, CLUE  Result Value Ref Range   Trichomonas Exam Negative Negative   Yeast Exam Positive (A) Negative   Clue Cell Exam Negative Negative  CBC with Differential/Platelet  Result Value Ref Range   WBC 7.8 3.4 - 10.8 x10E3/uL    RBC 4.12 3.77 - 5.28 x10E6/uL   Hemoglobin 11.4 11.1 - 15.9 g/dL   Hematocrit 35.5 34.0 - 46.6 %   MCV 86 79 - 97 fL   MCH 27.7 26.6 - 33.0 pg   MCHC 32.1 31.5 - 35.7 g/dL   RDW 16.2 (H) 12.3 - 15.4 %   Platelets 240 150 - 450 x10E3/uL   Neutrophils 60 Not Estab. %   Lymphs 32 Not Estab. %   Monocytes 6 Not Estab. %   Eos 1 Not Estab. %   Basos 1 Not Estab. %   Neutrophils Absolute 4.7 1.4 - 7.0 x10E3/uL  Lymphocytes Absolute 2.5 0.7 - 3.1 x10E3/uL   Monocytes Absolute 0.5 0.1 - 0.9 x10E3/uL   EOS (ABSOLUTE) 0.1 0.0 - 0.4 x10E3/uL   Basophils Absolute 0.0 0.0 - 0.2 x10E3/uL   Immature Granulocytes 0 Not Estab. %   Immature Grans (Abs) 0.0 0.0 - 0.1 x10E3/uL  CMP14+EGFR  Result Value Ref Range   Glucose 219 (H) 65 - 99 mg/dL   BUN 18 8 - 27 mg/dL   Creatinine, Ser 0.88 0.57 - 1.00 mg/dL   GFR calc non Af Amer 69 >59 mL/min/1.73   GFR calc Af Amer 80 >59 mL/min/1.73   BUN/Creatinine Ratio 20 12 - 28   Sodium 140 134 - 144 mmol/L   Potassium 4.9 3.5 - 5.2 mmol/L   Chloride 102 96 - 106 mmol/L   CO2 23 20 - 29 mmol/L   Calcium 9.1 8.7 - 10.3 mg/dL   Total Protein 6.5 6.0 - 8.5 g/dL   Albumin 3.9 3.6 - 4.8 g/dL   Globulin, Total 2.6 1.5 - 4.5 g/dL   Albumin/Globulin Ratio 1.5 1.2 - 2.2   Bilirubin Total 0.2 0.0 - 1.2 mg/dL   Alkaline Phosphatase 58 39 - 117 IU/L   AST 27 0 - 40 IU/L   ALT 34 (H) 0 - 32 IU/L  Bayer DCA Hb A1c Waived  Result Value Ref Range   HB A1C (BAYER DCA - WAIVED) 8.2 (H) <7.0 %      Assessment & Plan:   1. Vaginal itching - WET PREP FOR [place], YEAST, CLUE  2. Type 2 diabetes mellitus without complication, without long-term current use of insulin (HCC) - CBC with Differential/Platelet - CMP14+EGFR - Bayer DCA Hb A1c Waived  3. Tinea cruris - nystatin cream (MYCOSTATIN); Apply 1 application topically 2 (two) times daily.  Dispense: 60 g; Refill: 0 - fluconazole (DIFLUCAN) 150 MG tablet; 1 po q week x 4 weeks  Dispense: 4 tablet; Refill:  0   Continue all other maintenance medications as listed above.  Follow up plan: No follow-ups on file.  Educational handout given for [place] PA-C [facility] [address]  [place], [place] [address] [phone]   01/26/2018, 8:23 PM

## 2018-02-10 DIAGNOSIS — R69 Illness, unspecified: Secondary | ICD-10-CM | POA: Diagnosis not present

## 2018-03-12 DIAGNOSIS — R69 Illness, unspecified: Secondary | ICD-10-CM | POA: Diagnosis not present

## 2018-04-08 ENCOUNTER — Other Ambulatory Visit: Payer: Self-pay | Admitting: Physician Assistant

## 2018-04-08 DIAGNOSIS — F3341 Major depressive disorder, recurrent, in partial remission: Secondary | ICD-10-CM

## 2018-05-09 ENCOUNTER — Other Ambulatory Visit: Payer: Self-pay | Admitting: Physician Assistant

## 2018-05-09 DIAGNOSIS — F3341 Major depressive disorder, recurrent, in partial remission: Secondary | ICD-10-CM

## 2018-05-11 MED ORDER — BUPROPION HCL ER (XL) 150 MG PO TB24: 300.0000 mg | ORAL_TABLET | Freq: Every day | ORAL | 0 refills | Status: DC

## 2018-05-11 NOTE — Addendum Note (Signed)
Addended by: Marylin Crosby on: 05/11/2018 10:59 AM   Modules accepted: Orders

## 2018-05-11 NOTE — Telephone Encounter (Signed)
Pt scheduled with Particia Nearing 06/01/18 at 2:10 and 30 day refill sent to pharmacy.

## 2018-05-11 NOTE — Telephone Encounter (Signed)
[place]. NTBS 30 days given 04/09/18

## 2018-05-25 DIAGNOSIS — R69 Illness, unspecified: Secondary | ICD-10-CM | POA: Diagnosis not present

## 2018-06-01 ENCOUNTER — Encounter: Payer: Self-pay | Admitting: Physician Assistant

## 2018-06-01 ENCOUNTER — Ambulatory Visit (INDEPENDENT_AMBULATORY_CARE_PROVIDER_SITE_OTHER): Payer: Medicare HMO | Admitting: Physician Assistant

## 2018-06-01 VITALS — BP 130/67 | HR 61 | Temp 98.0°F | Ht <= 58 in | Wt 210.0 lb

## 2018-06-01 DIAGNOSIS — F3341 Major depressive disorder, recurrent, in partial remission: Secondary | ICD-10-CM | POA: Diagnosis not present

## 2018-06-01 DIAGNOSIS — E119 Type 2 diabetes mellitus without complications: Secondary | ICD-10-CM

## 2018-06-01 DIAGNOSIS — Z23 Encounter for immunization: Secondary | ICD-10-CM | POA: Diagnosis not present

## 2018-06-01 DIAGNOSIS — E039 Hypothyroidism, unspecified: Secondary | ICD-10-CM

## 2018-06-01 DIAGNOSIS — R69 Illness, unspecified: Secondary | ICD-10-CM | POA: Diagnosis not present

## 2018-06-01 MED ORDER — BUPROPION HCL ER (XL) 150 MG PO TB24: 300.0000 mg | ORAL_TABLET | Freq: Every day | ORAL | 11 refills | Status: DC

## 2018-06-02 ENCOUNTER — Other Ambulatory Visit: Payer: Medicare HMO

## 2018-06-02 DIAGNOSIS — E119 Type 2 diabetes mellitus without complications: Secondary | ICD-10-CM | POA: Diagnosis not present

## 2018-06-02 DIAGNOSIS — E039 Hypothyroidism, unspecified: Secondary | ICD-10-CM | POA: Diagnosis not present

## 2018-06-02 LAB — BAYER DCA HB A1C WAIVED: HB A1C (BAYER DCA - WAIVED): 7.6 % — ABNORMAL HIGH (ref <7.0)

## 2018-06-03 LAB — LIPID PANEL
Chol/HDL Ratio: 3.2 ratio (ref 0.0–4.4)
Cholesterol, Total: 169 mg/dL (ref 100–199)
HDL: 53 mg/dL (ref >39)
LDL Calculated: 88 mg/dL (ref 0–99)
Triglycerides: 138 mg/dL (ref 0–149)
VLDL Cholesterol Cal: 28 mg/dL (ref 5–40)

## 2018-06-03 LAB — CBC WITH DIFFERENTIAL/PLATELET
Basophils Absolute: 0.1 10*3/uL (ref 0.0–0.2)
Basos: 1 %
EOS (ABSOLUTE): 0.2 10*3/uL (ref 0.0–0.4)
EOS: 2 %
Hematocrit: 34 % (ref 34.0–46.6)
Hemoglobin: 10.7 g/dL — ABNORMAL LOW (ref 11.1–15.9)
Immature Grans (Abs): 0.1 10*3/uL (ref 0.0–0.1)
Immature Granulocytes: 1 %
Lymphocytes Absolute: 1.8 10*3/uL (ref 0.7–3.1)
Lymphs: 22 %
MCH: 26.3 pg — ABNORMAL LOW (ref 26.6–33.0)
MCHC: 31.5 g/dL (ref 31.5–35.7)
MCV: 84 fL (ref 79–97)
Monocytes Absolute: 0.5 10*3/uL (ref 0.1–0.9)
Monocytes: 6 %
Neutrophils Absolute: 5.6 10*3/uL (ref 1.4–7.0)
Neutrophils: 68 %
Platelets: 304 10*3/uL (ref 150–450)
RBC: 4.07 x10E6/uL (ref 3.77–5.28)
RDW: 15 % (ref 11.7–15.4)
WBC: 8.1 10*3/uL (ref 3.4–10.8)

## 2018-06-03 LAB — CMP14+EGFR
ALT: 27 IU/L (ref 0–32)
AST: 20 IU/L (ref 0–40)
Albumin/Globulin Ratio: 1.7 (ref 1.2–2.2)
Albumin: 4.2 g/dL (ref 3.6–4.8)
Alkaline Phosphatase: 64 IU/L (ref 39–117)
BUN/Creatinine Ratio: 14 (ref 12–28)
BUN: 11 mg/dL (ref 8–27)
Bilirubin Total: 0.3 mg/dL (ref 0.0–1.2)
CALCIUM: 9.6 mg/dL (ref 8.7–10.3)
CO2: 22 mmol/L (ref 20–29)
CREATININE: 0.78 mg/dL (ref 0.57–1.00)
Chloride: 101 mmol/L (ref 96–106)
GFR calc Af Amer: 92 mL/min/{1.73_m2} (ref >59)
GFR calc non Af Amer: 79 mL/min/{1.73_m2} (ref >59)
Globulin, Total: 2.5 g/dL (ref 1.5–4.5)
Glucose: 161 mg/dL — ABNORMAL HIGH (ref 65–99)
Potassium: 3.8 mmol/L (ref 3.5–5.2)
Sodium: 141 mmol/L (ref 134–144)
Total Protein: 6.7 g/dL (ref 6.0–8.5)

## 2018-06-03 LAB — TSH: TSH: 0.932 u[IU]/mL (ref 0.450–4.500)

## 2018-06-04 NOTE — Progress Notes (Signed)
BP 130/67   Pulse 61   Temp 98 F (36.7 C) (Oral)   Ht '4\' 10"'  (1.473 m)   Wt 210 lb (95.3 kg)   BMI 43.89 kg/m    Subjective:    Patient ID: [name], female    DOB: 06/09/1950, 68 y.o.   MRN: [id number]  HPI: [name] is a 67 y.o. female presenting on 06/01/2018 for Diabetes; Hypothyroidism; Hypertension; and Medical Management of Chronic Issues  This patient comes in for periodic recheck on medications and conditions including diabetes, hypothyroidism, depression.  She has had lots of sad days due to her daughter's miscarriage and depression.   All medications are reviewed today. There are no reports of any problems with the medications. All of the medical conditions are reviewed and updated.  Lab work is reviewed and will be ordered as medically necessary. There are no new problems reported with today's visit.   Past Medical History:  Diagnosis Date  . Arthritis   . Depression    ONGOING PROBLEM  . Diabetes mellitus without complication ([place])    DX 2008   USES PILLS  . Dizziness and giddiness 06/29/2015  . GERD (gastroesophageal reflux disease)   . Hypertension   . Hypothyroidism   . Morbidly obese ([place])   . Morton's neuroma of left foot    Relevant past medical, surgical, family and social history reviewed and updated as indicated. Interim medical history since our last visit reviewed. Allergies and medications reviewed and updated. DATA REVIEWED: CHART IN EPIC  Family History reviewed for pertinent findings.  Review of Systems  Constitutional: Negative.  Negative for activity change, fatigue and fever.  HENT: Negative.   Eyes: Negative.   Respiratory: Negative.  Negative for cough.   Cardiovascular: Negative.  Negative for chest pain.  Gastrointestinal: Negative.  Negative for abdominal pain.  Endocrine: Negative.   Genitourinary: Negative.  Negative for dysuria.  Musculoskeletal: Negative.   Skin: Negative.   Neurological: Negative.      Allergies as of 06/01/2018      Reactions   Penicillins Other (See Comments)   Reaction unknown   Shrimp [shellfish Allergy] Itching   Sulfa Antibiotics Other (See Comments)   Reaction unknown      Medication List       Accurate as of June 01, 2018 11:59 PM. Always use your most recent med list.        aspirin EC 81 MG tablet Take 81 mg by mouth daily.   blood glucose meter kit and supplies Kit Dispense based on patient and insurance preference. Use up to four times daily as directed. (FOR ICD-9 250.00, 250.01).   buPROPion 150 MG 24 hr tablet Commonly known as:  WELLBUTRIN XL Take 2 tablets (300 mg total) by mouth daily. (Needs to be seen before next refill)   glucose blood test strip Commonly known as:  ONETOUCH VERIO Use to check blood sugars up to 4 times daily   levothyroxine 100 MCG tablet Commonly known as:  SYNTHROID, LEVOTHROID TAKE 1 TABLET BY MOUTH ONCE DAILY BEFORE BREAKFAST   lisinopril 10 MG tablet Commonly known as:  PRINIVIL,ZESTRIL Take 1 tablet (10 mg total) by mouth daily.   loratadine 10 MG tablet Commonly known as:  CLARITIN Take 1 tablet (10 mg total) by mouth daily.   metFORMIN 1000 MG tablet Commonly known as:  GLUCOPHAGE TAKE 1 TABLET BY MOUTH TWICE DAILY WITH A MEAL   omeprazole 20 MG capsule Commonly known as:  PRILOSEC TAKE 1 CAPSULE BY MOUTH ONCE DAILY   ONETOUCH DELICA LANCETS 05R Misc Use to check blood sugars up to 4 times daily          Objective:    BP 130/67   Pulse 61   Temp 98 F (36.7 C) (Oral)   Ht '4\' 10"'  (1.473 m)   Wt 210 lb (95.3 kg)   BMI 43.89 kg/m   Allergies  Allergen Reactions  . Penicillins Other (See Comments)    Reaction unknown  . Shrimp [Shellfish Allergy] Itching  . Sulfa Antibiotics Other (See Comments)    Reaction unknown    Wt Readings from Last 3 Encounters:  06/01/18 210 lb (95.3 kg)  01/21/18 220 lb (99.8 kg)  12/18/17 224 lb (101.6 kg)    Physical Exam Constitutional:       Appearance: She is well-developed.  HENT:     Head: Normocephalic and atraumatic.  Eyes:     Conjunctiva/sclera: Conjunctivae normal.     Pupils: Pupils are equal, round, and reactive to light.  Cardiovascular:     Rate and Rhythm: Normal rate and regular rhythm.     Heart sounds: Normal heart sounds.  Pulmonary:     Effort: Pulmonary effort is normal.     Breath sounds: Normal breath sounds.  Abdominal:     General: Bowel sounds are normal.     Palpations: Abdomen is soft.  Skin:    General: Skin is warm and dry.     Findings: No rash.  Neurological:     Mental Status: She is alert and oriented to person, place, and time.     Deep Tendon Reflexes: Reflexes are normal and symmetric.  Psychiatric:        Behavior: Behavior normal.        Thought Content: Thought content normal.        Judgment: Judgment normal.     Results for orders placed or performed in visit on 01/28/18  HM MAMMOGRAPHY  Result Value Ref Range   HM Mammogram 0-4 Bi-Rad 0-4 Bi-Rad, Self Reported Normal      Assessment & Plan:   1. Recurrent major depressive disorder, in partial remission (HCC) - buPROPion (WELLBUTRIN XL) 150 MG 24 hr tablet; Take 2 tablets (300 mg total) by mouth daily. (Needs to be seen before next refill)  Dispense: 60 tablet; Refill: 11  2. Encounter for immunization - Flu vaccine HIGH DOSE PF  3. Type 2 diabetes mellitus without complication, without long-term current use of insulin (HCC) - CBC with Differential/Platelet; Future - CMP14+EGFR; Future - Lipid panel; Future - TSH; Future - Bayer DCA Hb A1c Waived; Future  4. Hypothyroidism, unspecified type - TSH; Future   Continue all other maintenance medications as listed above.  Follow up plan: Return in about 4 months (around 09/30/2018).  Educational handout given for [place] PA-C [facility] [address]  [place], [place] [address] [phone]   06/04/2018, 7:22 PM

## 2018-08-07 DIAGNOSIS — H524 Presbyopia: Secondary | ICD-10-CM | POA: Diagnosis not present

## 2018-08-07 DIAGNOSIS — E109 Type 1 diabetes mellitus without complications: Secondary | ICD-10-CM | POA: Diagnosis not present

## 2018-08-07 DIAGNOSIS — I10 Essential (primary) hypertension: Secondary | ICD-10-CM | POA: Diagnosis not present

## 2018-08-07 LAB — HM DIABETES EYE EXAM

## 2018-08-13 DIAGNOSIS — Z0100 Encounter for examination of eyes and vision without abnormal findings: Secondary | ICD-10-CM | POA: Diagnosis not present

## 2018-09-23 ENCOUNTER — Other Ambulatory Visit: Payer: Self-pay | Admitting: Physician Assistant

## 2018-09-23 DIAGNOSIS — J309 Allergic rhinitis, unspecified: Secondary | ICD-10-CM

## 2018-09-24 ENCOUNTER — Telehealth: Payer: Self-pay | Admitting: Physician Assistant

## 2018-09-25 ENCOUNTER — Telehealth: Payer: Self-pay | Admitting: Physician Assistant

## 2018-09-25 ENCOUNTER — Other Ambulatory Visit: Payer: Self-pay | Admitting: Physician Assistant

## 2018-09-25 DIAGNOSIS — E119 Type 2 diabetes mellitus without complications: Secondary | ICD-10-CM

## 2018-09-25 NOTE — Telephone Encounter (Signed)
done

## 2018-09-25 NOTE — Telephone Encounter (Signed)
Patient aware.

## 2018-09-28 ENCOUNTER — Other Ambulatory Visit: Payer: Self-pay

## 2018-09-28 ENCOUNTER — Other Ambulatory Visit: Payer: Medicare HMO

## 2018-09-28 DIAGNOSIS — E119 Type 2 diabetes mellitus without complications: Secondary | ICD-10-CM | POA: Diagnosis not present

## 2018-09-28 LAB — BAYER DCA HB A1C WAIVED: HB A1C (BAYER DCA - WAIVED): 7.6 % — ABNORMAL HIGH (ref <7.0)

## 2018-09-30 ENCOUNTER — Ambulatory Visit (INDEPENDENT_AMBULATORY_CARE_PROVIDER_SITE_OTHER): Payer: Medicare HMO | Admitting: Physician Assistant

## 2018-09-30 ENCOUNTER — Encounter: Payer: Self-pay | Admitting: Physician Assistant

## 2018-09-30 ENCOUNTER — Other Ambulatory Visit: Payer: Self-pay

## 2018-09-30 DIAGNOSIS — E039 Hypothyroidism, unspecified: Secondary | ICD-10-CM | POA: Diagnosis not present

## 2018-09-30 DIAGNOSIS — E119 Type 2 diabetes mellitus without complications: Secondary | ICD-10-CM

## 2018-09-30 DIAGNOSIS — I10 Essential (primary) hypertension: Secondary | ICD-10-CM | POA: Diagnosis not present

## 2018-09-30 MED ORDER — LEVOTHYROXINE SODIUM 100 MCG PO TABS: ORAL_TABLET | ORAL | 3 refills | Status: DC

## 2018-09-30 MED ORDER — OMEPRAZOLE 20 MG PO CPDR: 20.0000 mg | DELAYED_RELEASE_CAPSULE | Freq: Every day | ORAL | 3 refills | Status: DC

## 2018-09-30 MED ORDER — LISINOPRIL 10 MG PO TABS: 10.0000 mg | ORAL_TABLET | Freq: Every day | ORAL | 3 refills | Status: DC

## 2018-09-30 MED ORDER — METFORMIN HCL 1000 MG PO TABS: ORAL_TABLET | ORAL | 5 refills | Status: DC

## 2018-09-30 NOTE — Progress Notes (Signed)
Telephone visit  Subjective: CC: Hypertension, hypothyroidism, type 2 diabetes PCP: Terald Sleeper, PA-C [id number] [name] is a 68 y.o. female calls for telephone consult today. Patient provides verbal consent for consult held via phone.  Patient is identified with 2 separate identifiers.  At this time the entire area is on COVID-19 social distancing and stay home orders are in place.  Patient is of higher risk and therefore we are performing this by a virtual method.  Location of patient: Home Location of provider: HOME Others present for call: No  This patient is having periodic recheck on her chronic medical conditions.  Her A1c was just checked and was 7.6.  Was the same as the last time.  Staff encouraged her to continue with her medications and watching her diet and exercise.  She denies any problems with chest pain or shortness of breath.  She will be needing lab work.   ROS: Per HPI  Allergies  Allergen Reactions  . Penicillins Other (See Comments)    Reaction unknown  . Shrimp [Shellfish Allergy] Itching  . Sulfa Antibiotics Other (See Comments)    Reaction unknown   Past Medical History:  Diagnosis Date  . Arthritis   . Depression    ONGOING PROBLEM  . Diabetes mellitus without complication ([place])    DX 2008   USES PILLS  . Dizziness and giddiness 06/29/2015  . GERD (gastroesophageal reflux disease)   . Hypertension   . Hypothyroidism   . Morbidly obese ([place])   . Morton's neuroma of left foot     Current Outpatient Medications:  .  aspirin EC 81 MG tablet, Take 81 mg by mouth daily., Disp: , Rfl:  .  blood glucose meter kit and supplies KIT, Dispense based on patient and insurance preference. Use up to four times daily as directed. (FOR ICD-9 250.00, 250.01)., Disp: 1 each, Rfl: 11 .  buPROPion (WELLBUTRIN XL) 150 MG 24 hr tablet, Take 2 tablets (300 mg total) by mouth daily. (Needs to be seen before next refill), Disp: 60 tablet, Rfl: 11 .  glucose blood  (ONETOUCH VERIO) test strip, Use to check blood sugars up to 4 times daily, Disp: 400 each, Rfl: 3 .  levothyroxine (SYNTHROID) 100 MCG tablet, TAKE 1 TABLET BY MOUTH ONCE DAILY BEFORE BREAKFAST, Disp: 90 tablet, Rfl: 3 .  lisinopril (ZESTRIL) 10 MG tablet, Take 1 tablet (10 mg total) by mouth daily., Disp: 90 tablet, Rfl: 3 .  loratadine (CLARITIN) 10 MG tablet, Take 1 tablet by mouth once daily, Disp: 90 tablet, Rfl: 1 .  metFORMIN (GLUCOPHAGE) 1000 MG tablet, TAKE 1 TABLET BY MOUTH TWICE DAILY WITH A MEAL, Disp: 60 tablet, Rfl: 5 .  omeprazole (PRILOSEC) 20 MG capsule, Take 1 capsule (20 mg total) by mouth daily., Disp: 90 capsule, Rfl: 3 .  ONETOUCH DELICA LANCETS 67E MISC, Use to check blood sugars up to 4 times daily, Disp: 400 each, Rfl: 3  Assessment/ Plan: 68 y.o. female   1. Essential hypertension - lisinopril (ZESTRIL) 10 MG tablet; Take 1 tablet (10 mg total) by mouth daily.  Dispense: 90 tablet; Refill: 3 - CMP14+EGFR; Future  2. Hypothyroidism, unspecified type - levothyroxine (SYNTHROID) 100 MCG tablet; TAKE 1 TABLET BY MOUTH ONCE DAILY BEFORE BREAKFAST  Dispense: 90 tablet; Refill: 3 - TSH; Future  3. Type 2 diabetes mellitus without complication, without long-term current use of insulin (HCC) - metFORMIN (GLUCOPHAGE) 1000 MG tablet; TAKE 1 TABLET BY MOUTH TWICE DAILY WITH  A MEAL  Dispense: 60 tablet; Refill: 5 - CBC with Differential/Platelet; Future - CMP14+EGFR; Future - Lipid panel; Future - Microalbumin / creatinine urine ratio; Future - Bayer DCA Hb A1c Waived; Future   Start time: 8:53 AM End time: 9:01 AM  Meds ordered this encounter  Medications  . lisinopril (ZESTRIL) 10 MG tablet    Sig: Take 1 tablet (10 mg total) by mouth daily.    Dispense:  90 tablet    Refill:  3    Order Specific Question:   Supervising Provider    Answer:   Janora Norlander [[id number]]  . levothyroxine (SYNTHROID) 100 MCG tablet    Sig: TAKE 1 TABLET BY MOUTH ONCE DAILY  BEFORE BREAKFAST    Dispense:  90 tablet    Refill:  3    Order Specific Question:   Supervising Provider    Answer:   Janora Norlander [[id number]]  . metFORMIN (GLUCOPHAGE) 1000 MG tablet    Sig: TAKE 1 TABLET BY MOUTH TWICE DAILY WITH A MEAL    Dispense:  60 tablet    Refill:  5    Please consider 90 day supplies to promote better adherence    Order Specific Question:   Supervising Provider    Answer:   Janora Norlander [[id number]]  . omeprazole (PRILOSEC) 20 MG capsule    Sig: Take 1 capsule (20 mg total) by mouth daily.    Dispense:  90 capsule    Refill:  3    Order Specific Question:   Supervising Provider    Answer:   Janora Norlander [[id number]]    Particia Nearing PA-C [facility] [phone]

## 2018-12-13 ENCOUNTER — Other Ambulatory Visit: Payer: Self-pay

## 2018-12-13 ENCOUNTER — Encounter (HOSPITAL_COMMUNITY): Payer: Self-pay | Admitting: Emergency Medicine

## 2018-12-13 ENCOUNTER — Emergency Department (HOSPITAL_COMMUNITY)
Admission: EM | Admit: 2018-12-13 | Discharge: 2018-12-13 | Disposition: A | Discharge status: Home / Self Care | Payer: Medicare HMO | Attending: Emergency Medicine | Admitting: Emergency Medicine

## 2018-12-13 DIAGNOSIS — Z7982 Long term (current) use of aspirin: Secondary | ICD-10-CM | POA: Diagnosis not present

## 2018-12-13 DIAGNOSIS — E119 Type 2 diabetes mellitus without complications: Secondary | ICD-10-CM | POA: Insufficient documentation

## 2018-12-13 DIAGNOSIS — I10 Essential (primary) hypertension: Secondary | ICD-10-CM | POA: Insufficient documentation

## 2018-12-13 DIAGNOSIS — W5501XA Bitten by cat, initial encounter: Secondary | ICD-10-CM | POA: Diagnosis not present

## 2018-12-13 DIAGNOSIS — Y999 Unspecified external cause status: Secondary | ICD-10-CM | POA: Insufficient documentation

## 2018-12-13 DIAGNOSIS — Z79899 Other long term (current) drug therapy: Secondary | ICD-10-CM | POA: Insufficient documentation

## 2018-12-13 DIAGNOSIS — Z23 Encounter for immunization: Secondary | ICD-10-CM | POA: Insufficient documentation

## 2018-12-13 DIAGNOSIS — S61451A Open bite of right hand, initial encounter: Secondary | ICD-10-CM | POA: Insufficient documentation

## 2018-12-13 DIAGNOSIS — Y939 Activity, unspecified: Secondary | ICD-10-CM | POA: Diagnosis not present

## 2018-12-13 DIAGNOSIS — Y92009 Unspecified place in unspecified non-institutional (private) residence as the place of occurrence of the external cause: Secondary | ICD-10-CM | POA: Diagnosis not present

## 2018-12-13 DIAGNOSIS — Z7984 Long term (current) use of oral hypoglycemic drugs: Secondary | ICD-10-CM | POA: Insufficient documentation

## 2018-12-13 DIAGNOSIS — L03113 Cellulitis of right upper limb: Secondary | ICD-10-CM | POA: Diagnosis not present

## 2018-12-13 DIAGNOSIS — E039 Hypothyroidism, unspecified: Secondary | ICD-10-CM | POA: Insufficient documentation

## 2018-12-13 DIAGNOSIS — Z880 Allergy status to penicillin: Secondary | ICD-10-CM | POA: Diagnosis not present

## 2018-12-13 DIAGNOSIS — L03119 Cellulitis of unspecified part of limb: Secondary | ICD-10-CM

## 2018-12-13 LAB — CBG MONITORING, ED: Glucose-Capillary: 119 mg/dL — ABNORMAL HIGH (ref 70–99)

## 2018-12-13 MED ORDER — DOXYCYCLINE HYCLATE 100 MG PO TABS
100.0000 mg | ORAL_TABLET | Freq: Once | ORAL | Status: AC
  Administered 2018-12-13: 100 mg via ORAL
  Filled 2018-12-13: qty 1

## 2018-12-13 MED ORDER — CLINDAMYCIN HCL 150 MG PO CAPS
300.0000 mg | ORAL_CAPSULE | Freq: Once | ORAL | Status: AC
  Administered 2018-12-13: 300 mg via ORAL
  Filled 2018-12-13: qty 2

## 2018-12-13 MED ORDER — CLINDAMYCIN HCL 150 MG PO CAPS: 300.0000 mg | ORAL_CAPSULE | Freq: Four times a day (QID) | ORAL | 0 refills | Status: DC

## 2018-12-13 MED ORDER — TETANUS-DIPHTH-ACELL PERTUSSIS 5-2.5-18.5 LF-MCG/0.5 IM SUSP
0.5000 mL | Freq: Once | INTRAMUSCULAR | Status: AC
  Administered 2018-12-13: 0.5 mL via INTRAMUSCULAR
  Filled 2018-12-13: qty 0.5

## 2018-12-13 MED ORDER — DOXYCYCLINE HYCLATE 100 MG PO CAPS: 100.0000 mg | ORAL_CAPSULE | Freq: Two times a day (BID) | ORAL | 0 refills | Status: DC

## 2018-12-13 NOTE — Discharge Instructions (Signed)
Please read and follow all provided instructions.  Your diagnoses today include:  1. Cat bite of right hand, initial encounter   2. Cellulitis of wrist     Tests performed today include:  Vital signs. See below for your results today.   Blood sugar  Medications prescribed:   Clindamycin - antibiotic  You have been prescribed an antibiotic medicine: take the entire course of medicine even if you are feeling better. Stopping early can cause the antibiotic not to work.   Doxycycline - antibiotic  You have been prescribed an antibiotic medicine: take the entire course of medicine even if you are feeling better. Stopping early can cause the antibiotic not to work.  Take any prescribed medications only as directed.   Home care instructions:  Follow any educational materials contained in this packet. Keep affected area above the level of your heart when possible. Wash area gently twice a day with warm soapy water. Do not apply alcohol or hydrogen peroxide. Cover the area if it draining or weeping.   Follow-up instructions:  It is very important that you follow-up with either your doctor or return to the emergency department tomorrow for a recheck of your bite wound.   Return instructions:  Return to the Emergency Department SOONER if you have:  Fever  Worsening symptoms  Worsening pain  Worsening swelling  Redness of the skin that moves away from the affected area, especially if it streaks away from the affected area   Any other emergent concerns  Your vital signs today were: BP (!) 151/80 (BP Location: Right Arm)    Pulse 79    Temp 98 F (36.7 C) (Oral)    Resp 18    Ht 4' 10.5" (1.486 m)    Wt 92.5 kg    SpO2 99%    BMI 41.91 kg/m  If your blood pressure (BP) was elevated above 135/85 this visit, please have this repeated by your doctor within one month. --------------

## 2018-12-13 NOTE — ED Provider Notes (Signed)
[facility] EMERGENCY DEPARTMENT Provider Note   CSN: [id number] Arrival date & time: 12/13/18  1916     History   Chief Complaint Chief Complaint  Patient presents with  . Animal Bite    HPI [name] is a 68 y.o. female.     Patient with history of diabetes presents to the emergency department with wrist pain and swelling after being bit by a cat approximately 24 hours ago.  Cat is not vaccinated against rabies however lives indoors and is in a new location.  Swelling and pain is gradually become worse throughout the day today.  Pain is worse with movement.  No numbness or tingling in her fingers.  No fevers, nausea or vomiting.     Past Medical History:  Diagnosis Date  . Arthritis   . Depression    ONGOING PROBLEM  . Diabetes mellitus without complication ([place])    DX 2008   USES PILLS  . Dizziness and giddiness 06/29/2015  . GERD (gastroesophageal reflux disease)   . Hypertension   . Hypothyroidism   . Morbidly obese ([place])   . Morton's neuroma of left foot     Patient Active Problem List   Diagnosis Date Noted  . Diabetes ([place]) 05/29/2016  . Gastroesophageal reflux disease without esophagitis 05/29/2016  . Hypothyroidism 05/29/2016  . Primary osteoarthritis involving multiple joints 05/29/2016  . Essential hypertension 05/29/2016  . Recurrent major depressive disorder, in partial remission ([place]) 05/29/2016  . Elevated cholesterol 05/29/2016  . Dizziness and giddiness 06/29/2015    Past Surgical History:  Procedure Laterality Date  . CARPAL TUNNEL RELEASE     LEFT  . CARPAL TUNNEL RELEASE Right 12/22/2012   Procedure: CARPAL TUNNEL RELEASE- RIGHT;  Surgeon: Meredith Pel, MD;  Location: [facility];  Service: Orthopedics;  Laterality: Right;  . SHOULDER ARTHROSCOPY W/ ROTATOR CUFF REPAIR     LEFT  . SHOULDER ARTHROSCOPY WITH ROTATOR CUFF REPAIR Right 12/22/2012   Procedure: SHOULDER ARTHROSCOPY WITH ROTATOR CUFF REPAIR AND BICEPS TENODESIS REPAIR;   Surgeon: Meredith Pel, MD;  Location: [facility];  Service: Orthopedics;  Laterality: Right;  Right Carpal Tunnel Release, Right Shoulder Arthroscopy, Rotator Cuff Tear Repair, Biceps Tenodesis  . TOE SURGERY     LEFT BIG TOE  . TUBAL LIGATION       OB History   No obstetric history on file.      Home Medications    Prior to Admission medications   Medication Sig Start Date End Date Taking? Authorizing Provider  aspirin EC 81 MG tablet Take 81 mg by mouth daily.    [provider]  blood glucose meter kit and supplies KIT Dispense based on patient and insurance preference. Use up to four times daily as directed. (FOR ICD-9 250.00, 250.01). 05/29/16   Terald Sleeper, PA-C  buPROPion (WELLBUTRIN XL) 150 MG 24 hr tablet Take 2 tablets (300 mg total) by mouth daily. (Needs to be seen before next refill) 06/01/18   Terald Sleeper, PA-C  glucose blood (ONETOUCH VERIO) test strip Use to check blood sugars up to 4 times daily 07/03/17   Terald Sleeper, PA-C  levothyroxine (SYNTHROID) 100 MCG tablet TAKE 1 TABLET BY MOUTH ONCE DAILY BEFORE BREAKFAST 09/30/18   Terald Sleeper, PA-C  lisinopril (ZESTRIL) 10 MG tablet Take 1 tablet (10 mg total) by mouth daily. 09/30/18   Terald Sleeper, PA-C  loratadine (CLARITIN) 10 MG tablet Take 1 tablet by mouth once daily 09/23/18  Terald Sleeper, PA-C  metFORMIN (GLUCOPHAGE) 1000 MG tablet TAKE 1 TABLET BY MOUTH TWICE DAILY WITH A MEAL 09/30/18   Terald Sleeper, PA-C  omeprazole (PRILOSEC) 20 MG capsule Take 1 capsule (20 mg total) by mouth daily. 09/30/18   Terald Sleeper, PA-C  ONETOUCH DELICA LANCETS 35H MISC Use to check blood sugars up to 4 times daily 07/03/17   Terald Sleeper, PA-C    Family History Family History  Problem Relation Age of Onset  . Heart attack Mother   . Cancer Father        brain  . Heart failure Father   . Cancer Brother        Prostate  . Leukemia Brother   . Dementia Sister        alzheimers  . Heart disease Sister   .  Diabetes Sister   . Hypertension Sister   . Hyperlipidemia Sister   . Heart failure Sister     Social History Social History   Tobacco Use  . Smoking status: Never Smoker  . Smokeless tobacco: Never Used  Substance Use Topics  . Alcohol use: No  . Drug use: No     Allergies   Penicillins, Shrimp [shellfish allergy], and Sulfa antibiotics   Review of Systems Review of Systems  Constitutional: Negative for activity change.  Musculoskeletal: Positive for arthralgias and myalgias. Negative for back pain, joint swelling and neck pain.  Skin: Negative for wound.  Neurological: Negative for weakness and numbness.     Physical Exam Updated Vital Signs BP (!) 151/80 (BP Location: Right Arm)   Pulse 79   Temp 98 F (36.7 C) (Oral)   Resp 18   Ht 4' 10.5" (1.486 m)   Wt 92.5 kg   SpO2 99%   BMI 41.91 kg/m   Physical Exam Vitals signs and nursing note reviewed.  Constitutional:      Appearance: She is well-developed.  HENT:     Head: Normocephalic and atraumatic.  Eyes:     Conjunctiva/sclera: Conjunctivae normal.  Neck:     Musculoskeletal: Normal range of motion and neck supple.  Pulmonary:     Effort: No respiratory distress.  Musculoskeletal:     Comments: Patient with several small abrasions, bite marks on the right hand and wrist area.  Patient is tender over the volar and dorsal aspect of the wrist. There is some mild erythema volarly without streaking. Pain with movement but can flex/extend slowly. She flexes and extends all fingers without difficulty.   Skin:    General: Skin is warm and dry.  Neurological:     Mental Status: She is alert.          ED Treatments / Results  Labs (all labs ordered are listed, but only abnormal results are displayed) Labs Reviewed  CBG MONITORING, ED - Abnormal; Notable for the following components:      Result Value   Glucose-Capillary 119 (*)    All other components within normal limits    EKG None   Radiology No results found.  Procedures Procedures (including critical care time)  Medications Ordered in ED Medications  doxycycline (VIBRA-TABS) tablet 100 mg (100 mg Oral Given 12/13/18 1957)  clindamycin (CLEOCIN) capsule 300 mg (300 mg Oral Given 12/13/18 1957)  Tdap (BOOSTRIX) injection 0.5 mL (0.5 mLs Intramuscular Given 12/13/18 1958)     Initial Impression / Assessment and Plan / ED Course  I have reviewed the triage vital signs and the nursing  notes.  Pertinent labs & imaging results that were available during my care of the patient were reviewed by me and considered in my medical decision making (see chart for details).        Patient seen and examined. Work-up initiated. Medications ordered.  Considered IV Unasyn however patient with pen allergy.  Will give oral doxycycline, clindamycin, update tetanus.   I had discussion with patient that she is high risk for this area becoming worse.  Right now her symptoms are relatively mild but infections from animal bites can spread rapidly and for this reason she needs close follow-up tomorrow.  If symptoms are worsening or if she is unable to obtain follow-up with her doctor, she needs to return to the emergency department for consideration of IV antibiotics, admission, orthopedic consultation.  Will check blood sugar and reassess.  Vital signs reviewed and are as follows: BP (!) 151/80 (BP Location: Right Arm)   Pulse 79   Temp 98 F (36.7 C) (Oral)   Resp 18   Ht 4' 10.5" (1.486 m)   Wt 92.5 kg   SpO2 99%   BMI 41.91 kg/m   CBG OK. Pt ready for d/c.   Final Clinical Impressions(s) / ED Diagnoses   Final diagnoses:  Cat bite of right hand, initial encounter  Cellulitis of wrist   Patient with cat bite.  Suspect mild early infection at this point.  No fevers.  No signs of tenosynovitis tonight.  Patient is diabetic.  I do not feel that she requires emergent orthopedic consultation or admission to the hospital, however  will need a recheck of her wound in the next 24 hours to ensure no worsening.  Patient was extensively educated on signs and symptoms which should cause her to return.  If she cannot see her doctor tomorrow she is directed to return to the emergency department for recheck.  Animal is not up-to-date on rabies vaccine however lives indoors and its location is known.  No indication for rabies series tonight.  Animal control aware.  ED Discharge Orders         Ordered    clindamycin (CLEOCIN) 150 MG capsule  Every 6 hours     12/13/18 2111    doxycycline (VIBRAMYCIN) 100 MG capsule  2 times daily     12/13/18 2111           Carlisle Cater, PA-C 12/13/18 2151    Francine Graven, DO 12/17/18 [phone]

## 2018-12-13 NOTE — ED Notes (Signed)
[facility] notified.

## 2018-12-13 NOTE — ED Triage Notes (Signed)
Pt states she was bitten by her cat last night on her right hand. The cat has not been vaccinated for rabies. Animal control notified.

## 2018-12-14 ENCOUNTER — Ambulatory Visit (INDEPENDENT_AMBULATORY_CARE_PROVIDER_SITE_OTHER): Payer: Medicare HMO | Admitting: Family

## 2018-12-14 ENCOUNTER — Encounter: Payer: Self-pay | Admitting: Family

## 2018-12-14 VITALS — BP 136/83 | HR 61 | Temp 98.5°F | Ht 58.5 in | Wt 205.2 lb

## 2018-12-14 DIAGNOSIS — L03113 Cellulitis of right upper limb: Secondary | ICD-10-CM

## 2018-12-14 DIAGNOSIS — W5501XD Bitten by cat, subsequent encounter: Secondary | ICD-10-CM | POA: Diagnosis not present

## 2018-12-14 DIAGNOSIS — Z09 Encounter for follow-up examination after completed treatment for conditions other than malignant neoplasm: Secondary | ICD-10-CM

## 2018-12-14 MED ORDER — CEFTRIAXONE SODIUM 1 G IJ SOLR
1.0000 g | Freq: Once | INTRAMUSCULAR | Status: AC
  Administered 2018-12-14: 1 g via INTRAMUSCULAR

## 2018-12-14 NOTE — Patient Instructions (Signed)
Animal Bite, Adult Animal bites range from mild to serious. An animal bite can result in any of these injuries:  A scratch.  A deep, open cut.  A puncture of the skin.  A crush injury.  Tearing away of the skin or a body part.  A bone injury. A small bite from a house pet is usually less serious than a bite from a stray or wild animal, such as a raccoon, fox, skunk, or bat. That is because stray and wild animals have a higher risk of carrying a serious infection called rabies, which can be passed to humans through a bite. What increases the risk? You are more likely to be bitten by an animal if:  You are around unfamiliar pets.  You disturb an animal when it is eating, sleeping, or caring for its babies.  You are outdoors in a place where small, wild animals roam freely. What are the signs or symptoms? Common symptoms of an animal bite include:  Pain.  Bleeding.  Swelling.  Bruising. How is this diagnosed? This condition may be diagnosed based on a physical exam and medical history. Your health care provider will examine your wound and ask for details about the animal and how the bite happened. You may also have tests, such as:  Blood tests to check for infection.  X-rays to check for damage to bones or joints.  Taking a fluid sample from your wound and checking it for infection (culture test). How is this treated? Treatment varies depending on the type of animal, where the bite is on your body, and your medical history. Treatment may include:  Caring for the wound. This often includes cleaning the wound, rinsing out (flushing) the wound with saline solution, and applying a bandage (dressing). In some cases, the wound may be closed with stitches (sutures), staples, skin glue, or adhesive strips.  Antibiotic medicine to prevent or treat infection. This medicine may be prescribed in pill or ointment form. If the bite area becomes infected, the medicine may be given through  an IV.  A tetanus shot to prevent tetanus infection.  Rabies treatment to prevent rabies infection. This will be done if the animal could have rabies.  Surgery. This may be done if a bite gets infected or if there is damage that needs to be repaired. Follow these instructions at home: Wound care   Follow instructions from your health care provider about how to take care of your wound. Make sure you: ? Wash your hands with soap and water before you change your dressing. If soap and water are not available, use hand sanitizer. ? Change your dressing as told by your health care provider. ? Leave sutures, skin glue, or adhesive strips in place. These skin closures may need to stay in place for 2 weeks or longer. If adhesive strip edges start to loosen and curl up, you may trim the loose edges. Do not remove adhesive strips completely unless your health care provider tells you to do that.  Check your wound every day for signs of infection. Check for: ? More redness, swelling, or pain. ? More fluid or blood. ? Warmth. ? Pus or a bad smell. Medicines  Take or apply over-the-counter and prescription medicines only as told by your health care provider.  If you were prescribed an antibiotic, take or apply it as told by your health care provider. Do not stop using the antibiotic even if your condition improves. General instructions   Keep the injured   area raised (elevated) above the level of your heart while you are sitting or lying down, if this is possible.  If directed, put ice on the injured area. ? Put ice in a plastic bag. ? Place a towel between your skin and the bag. ? Leave the ice on for 20 minutes, 2-3 times per day.  Keep all follow-up visits as told by your health care provider. This is important. Contact a health care provider if:  You have more redness, swelling, or pain around your wound.  Your wound feels warm to the touch.  You have a fever or chills.  You have a  general feeling of sickness (malaise).  You feel nauseous or you vomit.  You have pain that does not get better. Get help right away if:  You have a red streak that leads away from your wound.  You have non-clear fluid or more blood coming from your wound.  There is pus or a bad smell coming from your wound.  You have trouble moving your injured area.  You have numbness or tingling that extends beyond the wound. Summary  Animal bites can range from mild to serious. An animal bite can cause a scratch on the skin, a deep open cut, a puncture of the skin, a crush injury, tearing away of the skin or a body part, or a bone injury.  Your health care provider will examine your wound and ask for details about the animal and how the bite happened.  You may also have tests such as a blood test, X-ray, or testing of a fluid sample from your wound (culture test).  Treatment may include wound care, antibiotic medicine, a tetanus shot, and rabies treatment if the animal could have rabies. This information is not intended to replace advice given to you by your health care provider. Make sure you discuss any questions you have with your health care provider. Document Released: 01/29/2011 Document Revised: 05/08/2017 Document Reviewed: 11/21/2016 Elsevier Patient Education  2020 [employer].

## 2018-12-14 NOTE — Progress Notes (Addendum)
Subjective:    Patient ID: [name], female    DOB: July 21, 1950, 68 y.o.   MRN: [id number]  Chief Complaint  Patient presents with  . Animal Bite   Pt presents to the office today for follow up from cat bite on her right hand that occurred on 12/12/18. States she woke up yesterday and her hand was red and swollen and went to the ED. She was given doxycyline and clindamycin. She also received a TDAP injection. She states she has not started the antibiotics yet because she just picked them up.   She states her swelling is improved, but continues to have intermittent aching pain of  6 out 10 with any movement of her hand.  Animal Bite  The incident occurred more than 2 days ago (18th at evening). Head/neck injury location: right hand.      Review of Systems  All other systems reviewed and are negative.      Objective:   Physical Exam Vitals signs reviewed.  Constitutional:      General: She is not in acute distress.    Appearance: She is well-developed.  HENT:     Head: Normocephalic and atraumatic.  Eyes:     Pupils: Pupils are equal, round, and reactive to light.  Neck:     Musculoskeletal: Normal range of motion and neck supple.     Thyroid: No thyromegaly.  Cardiovascular:     Rate and Rhythm: Normal rate and regular rhythm.     Heart sounds: Normal heart sounds. No murmur.  Pulmonary:     Effort: Pulmonary effort is normal. No respiratory distress.     Breath sounds: Normal breath sounds. No wheezing.  Abdominal:     General: Bowel sounds are normal. There is no distension.     Palpations: Abdomen is soft.     Tenderness: There is no abdominal tenderness.  Musculoskeletal: Normal range of motion.        General: No tenderness.  Skin:    General: Skin is warm.     Comments: Right wrist swelling, tenderness, warmth, several scattered puncture wounds on right thumb extending down into wrist.   Neurological:     Mental Status: She is alert and oriented to  person, place, and time.     Cranial Nerves: No cranial nerve deficit.     Deep Tendon Reflexes: Reflexes are normal and symmetric.  Psychiatric:        Behavior: Behavior normal.        Thought Content: Thought content normal.        Judgment: Judgment normal.           BP 136/83   Pulse 61   Temp 98.5 F (36.9 C) (Oral)   Ht 4' 10.5" (1.486 m)   Wt 205 lb 3.2 oz (93.1 kg)   BMI 42.16 kg/m      Assessment & Plan:  [name] comes in today with chief complaint of Animal Bite   Diagnosis and orders addressed:  1. Cat bite, subsequent encounter - cefTRIAXone (ROCEPHIN) injection 1 g  2. Cellulitis of right hand - cefTRIAXone (ROCEPHIN) injection 1 g  3. Hospital discharge follow-up  Discussed the importance of starting and taking antibiotics, she will start them as soon as she leaves She has penicillin allergy and can not remember what her reaction was, will go ahead and give Rocephin injection and keep her here for 20 mins to monitor her.   Keep clean and dry  RTO in 48 hours    Evelina Dun, [place]

## 2018-12-16 ENCOUNTER — Ambulatory Visit (INDEPENDENT_AMBULATORY_CARE_PROVIDER_SITE_OTHER): Payer: Medicare HMO | Admitting: Family Medicine

## 2018-12-16 ENCOUNTER — Other Ambulatory Visit: Payer: Self-pay

## 2018-12-16 ENCOUNTER — Emergency Department (HOSPITAL_COMMUNITY)
Admission: EM | Admit: 2018-12-16 | Discharge: 2018-12-16 | Disposition: A | Discharge status: Home / Self Care | Payer: Medicare HMO | Attending: Emergency Medicine | Admitting: Emergency Medicine

## 2018-12-16 ENCOUNTER — Encounter (HOSPITAL_COMMUNITY): Payer: Self-pay

## 2018-12-16 ENCOUNTER — Ambulatory Visit: Payer: Medicare HMO

## 2018-12-16 VITALS — BP 129/64 | HR 76 | Temp 97.5°F | Ht 58.5 in | Wt 209.0 lb

## 2018-12-16 DIAGNOSIS — Z7984 Long term (current) use of oral hypoglycemic drugs: Secondary | ICD-10-CM | POA: Insufficient documentation

## 2018-12-16 DIAGNOSIS — S61451S Open bite of right hand, sequela: Secondary | ICD-10-CM | POA: Diagnosis not present

## 2018-12-16 DIAGNOSIS — Z7982 Long term (current) use of aspirin: Secondary | ICD-10-CM | POA: Diagnosis not present

## 2018-12-16 DIAGNOSIS — L089 Local infection of the skin and subcutaneous tissue, unspecified: Secondary | ICD-10-CM | POA: Diagnosis not present

## 2018-12-16 DIAGNOSIS — W5501XD Bitten by cat, subsequent encounter: Secondary | ICD-10-CM

## 2018-12-16 DIAGNOSIS — Z79899 Other long term (current) drug therapy: Secondary | ICD-10-CM | POA: Diagnosis not present

## 2018-12-16 DIAGNOSIS — S61451D Open bite of right hand, subsequent encounter: Secondary | ICD-10-CM | POA: Diagnosis not present

## 2018-12-16 DIAGNOSIS — E039 Hypothyroidism, unspecified: Secondary | ICD-10-CM | POA: Insufficient documentation

## 2018-12-16 DIAGNOSIS — W5501XS Bitten by cat, sequela: Secondary | ICD-10-CM | POA: Insufficient documentation

## 2018-12-16 DIAGNOSIS — I10 Essential (primary) hypertension: Secondary | ICD-10-CM | POA: Insufficient documentation

## 2018-12-16 DIAGNOSIS — E119 Type 2 diabetes mellitus without complications: Secondary | ICD-10-CM | POA: Insufficient documentation

## 2018-12-16 MED ORDER — CEFTRIAXONE SODIUM 1 G IJ SOLR
1.0000 g | Freq: Once | INTRAMUSCULAR | Status: AC
  Administered 2018-12-16: 1 g via INTRAMUSCULAR

## 2018-12-16 NOTE — Progress Notes (Signed)
Subjective: CC: ?hand infection PCP: Madison Sleeper, PA-C MAY:[id number] [name] is a 68 y.o. female presenting to clinic today for:  1. Hand infection? Patient has been seen x3 for cat bite including emergency department.  She notes that she is had worsening hand and wrist pain though the redness and swelling has gone down with the antibiotics that she was prescribed.  Currently on both doxycycline and clindamycin.  She is had 1 dose of Rocephin IM 2 days ago.  She is up-to-date on tetanus shot through the emergency department.  She does report that purulence started coming out of the wound along the thumb today.  She has had no fevers.   ROS: Per HPI  Allergies  Allergen Reactions  . Penicillins Other (See Comments)    Reaction unknown  . Shrimp [Shellfish Allergy] Itching  . Sulfa Antibiotics Other (See Comments)    Reaction unknown   Past Medical History:  Diagnosis Date  . Arthritis   . Depression    ONGOING PROBLEM  . Diabetes mellitus without complication ([place])    DX 2008   USES PILLS  . Dizziness and giddiness 06/29/2015  . GERD (gastroesophageal reflux disease)   . Hypertension   . Hypothyroidism   . Morbidly obese ([place])   . Morton's neuroma of left foot     Current Outpatient Medications:  .  aspirin EC 81 MG tablet, Take 81 mg by mouth daily., Disp: , Rfl:  .  blood glucose meter kit and supplies KIT, Dispense based on patient and insurance preference. Use up to four times daily as directed. (FOR ICD-9 250.00, 250.01)., Disp: 1 each, Rfl: 11 .  buPROPion (WELLBUTRIN XL) 150 MG 24 hr tablet, Take 2 tablets (300 mg total) by mouth daily. (Needs to be seen before next refill), Disp: 60 tablet, Rfl: 11 .  clindamycin (CLEOCIN) 150 MG capsule, Take 2 capsules (300 mg total) by mouth every 6 (six) hours., Disp: 56 capsule, Rfl: 0 .  doxycycline (VIBRAMYCIN) 100 MG capsule, Take 1 capsule (100 mg total) by mouth 2 (two) times daily., Disp: 14 capsule, Rfl: 0 .  glucose  blood (ONETOUCH VERIO) test strip, Use to check blood sugars up to 4 times daily, Disp: 400 each, Rfl: 3 .  levothyroxine (SYNTHROID) 100 MCG tablet, TAKE 1 TABLET BY MOUTH ONCE DAILY BEFORE BREAKFAST, Disp: 90 tablet, Rfl: 3 .  lisinopril (ZESTRIL) 10 MG tablet, Take 1 tablet (10 mg total) by mouth daily., Disp: 90 tablet, Rfl: 3 .  metFORMIN (GLUCOPHAGE) 1000 MG tablet, TAKE 1 TABLET BY MOUTH TWICE DAILY WITH A MEAL, Disp: 60 tablet, Rfl: 5 .  omeprazole (PRILOSEC) 20 MG capsule, Take 1 capsule (20 mg total) by mouth daily., Disp: 90 capsule, Rfl: 3 .  ONETOUCH DELICA LANCETS 41S MISC, Use to check blood sugars up to 4 times daily, Disp: 400 each, Rfl: 3 Social History   Socioeconomic History  . Marital status: Widowed    Spouse name: Not on file  . Number of children: 1  . Years of education: 3  . Highest education level: Not on file  Occupational History  . Occupation: disability  Social Needs  . Financial resource strain: Not on file  . Food insecurity    Worry: Not on file    Inability: Not on file  . Transportation needs    Medical: Not on file    Non-medical: Not on file  Tobacco Use  . Smoking status: Never Smoker  . Smokeless tobacco:  Never Used  Substance and Sexual Activity  . Alcohol use: No  . Drug use: No  . Sexual activity: Not on file  Lifestyle  . Physical activity    Days per week: Not on file    Minutes per session: Not on file  . Stress: Not on file  Relationships  . Social Herbalist on phone: Not on file    Gets together: Not on file    Attends religious service: Not on file    Active member of club or organization: Not on file    Attends meetings of clubs or organizations: Not on file    Relationship status: Not on file  . Intimate partner violence    Fear of current or ex partner: Not on file    Emotionally abused: Not on file    Physically abused: Not on file    Forced sexual activity: Not on file  Other Topics Concern  . Not on  file  Social History Narrative   Patient drinks 1 soda daily.   Patient is left handed.    Family History  Problem Relation Age of Onset  . Heart attack Mother   . Cancer Father        brain  . Heart failure Father   . Cancer Brother        Prostate  . Leukemia Brother   . Dementia Sister        alzheimers  . Heart disease Sister   . Diabetes Sister   . Hypertension Sister   . Hyperlipidemia Sister   . Heart failure Sister     Objective: Office vital signs reviewed. BP 129/64   Pulse 76   Temp (!) 97.5 F (36.4 C) (Temporal)   Ht 4' 10.5" (1.486 m)   Wt 209 lb (94.8 kg)   BMI 42.94 kg/m   Physical Examination:  General: Awake, alert, well nourished, No acute distress Skin: Small healing laceration noted along the dorsal aspect of the right thumb.  She has purulent discharge visible from this wound.  She has multiple other puncture and abrasion wounds along the back of the hand and wrist.  She has exquisite tenderness palpation along the dorsal wrist and hand.  Mild soft tissue swelling noted.  Assessment/ Plan: 68 y.o. female   1. Cat bite, subsequent encounter She was given a dose of Rocephin intramuscularly here in office.  I attempted to arrange outpatient evaluation by hand surgery but unfortunately nobody was able to accommodate her today.  They have recommended that she seek care in the emergency department.  I directed her to [facility] emergency department as the hand surgeon is on call for that site.  [facility] triage nurse will be contacted as well to inform of patient's arrival.  Patient understands the urgency of this and will head directly to the emergency department. - cefTRIAXone (ROCEPHIN) injection 1 g - Ambulatory referral to Hand Surgery  2. Bite wound of right hand with infection, subsequent encounter - cefTRIAXone (ROCEPHIN) injection 1 g - Ambulatory referral to Hand Surgery   No orders of the defined types were placed in this encounter.  No  orders of the defined types were placed in this encounter.    Janora Norlander, DO [place] [phone]

## 2018-12-16 NOTE — ED Triage Notes (Signed)
Onset 12-12-18 bit on right hand by duaghters cat.  Cat has not been vaccinated, animal control was called and cat is in quarantine.     Pt sent from [place] to get consult with hand surgery.  Onset yesterday wounds draining pus.   Pt seen at [facility] 12-13-18 was given antibiotic injection, prescriptions for anitbiotics, and received Rocephin 1g injection today in office.

## 2018-12-16 NOTE — ED Provider Notes (Signed)
[facility] EMERGENCY DEPARTMENT Provider Note   CSN: [id number] Arrival date & time: 12/16/18  1624     History   Chief Complaint Chief Complaint  Patient presents with  . Wound Infection    HPI [name] is a 68 y.o. female.     Patient is a 68 year old female with a history of diabetes, GERD, hypertension who is presenting today with persistent wound from a cat bite on her right hand.  Patient was seen on Saturday 5 days prior to arrival when the cat bite initially occurred.  She was given a tetanus shot, the cat was quarantined as it had not been vaccinated and she was started on doxycycline and clindamycin due to a penicillin allergy.  Patient did not start taking the antibiotic till Monday but at that time also started to have swelling and pain to her right hand.  She saw her doctor on Monday and was given a shot of Rocephin in addition to the antibiotic she has been taking orally.  She states yesterday the wound started draining purulent material and the swelling and real redness did improve some.  She saw her doctor again today was given a second dose of ceftriaxone and because they were unable to get an appointment with a hand surgeon today she was sent here for further care.  Patient denies any systemic symptoms states the redness and swelling has improved but it is still tender.  The history is provided by the patient.    Past Medical History:  Diagnosis Date  . Arthritis   . Depression    ONGOING PROBLEM  . Diabetes mellitus without complication ([place])    DX 2008   USES PILLS  . Dizziness and giddiness 06/29/2015  . GERD (gastroesophageal reflux disease)   . Hypertension   . Hypothyroidism   . Morbidly obese ([place])   . Morton's neuroma of left foot     Patient Active Problem List   Diagnosis Date Noted  . Diabetes ([place]) 05/29/2016  . Gastroesophageal reflux disease without esophagitis 05/29/2016  . Hypothyroidism 05/29/2016  . Primary  osteoarthritis involving multiple joints 05/29/2016  . Essential hypertension 05/29/2016  . Recurrent major depressive disorder, in partial remission ([place]) 05/29/2016  . Elevated cholesterol 05/29/2016  . Dizziness and giddiness 06/29/2015    Past Surgical History:  Procedure Laterality Date  . CARPAL TUNNEL RELEASE     LEFT  . CARPAL TUNNEL RELEASE Right 12/22/2012   Procedure: CARPAL TUNNEL RELEASE- RIGHT;  Surgeon: Meredith Pel, MD;  Location: [facility];  Service: Orthopedics;  Laterality: Right;  . SHOULDER ARTHROSCOPY W/ ROTATOR CUFF REPAIR     LEFT  . SHOULDER ARTHROSCOPY WITH ROTATOR CUFF REPAIR Right 12/22/2012   Procedure: SHOULDER ARTHROSCOPY WITH ROTATOR CUFF REPAIR AND BICEPS TENODESIS REPAIR;  Surgeon: Meredith Pel, MD;  Location: [facility];  Service: Orthopedics;  Laterality: Right;  Right Carpal Tunnel Release, Right Shoulder Arthroscopy, Rotator Cuff Tear Repair, Biceps Tenodesis  . TOE SURGERY     LEFT BIG TOE  . TUBAL LIGATION       OB History   No obstetric history on file.      Home Medications    Prior to Admission medications   Medication Sig Start Date End Date Taking? Authorizing Provider  aspirin EC 81 MG tablet Take 81 mg by mouth daily.    [provider]  blood glucose meter kit and supplies KIT Dispense based on patient and insurance preference. Use up  to four times daily as directed. (FOR ICD-9 250.00, 250.01). 05/29/16   Terald Sleeper, PA-C  buPROPion (WELLBUTRIN XL) 150 MG 24 hr tablet Take 2 tablets (300 mg total) by mouth daily. (Needs to be seen before next refill) 06/01/18   Terald Sleeper, PA-C  clindamycin (CLEOCIN) 150 MG capsule Take 2 capsules (300 mg total) by mouth every 6 (six) hours. 12/13/18   Carlisle Cater, PA-C  doxycycline (VIBRAMYCIN) 100 MG capsule Take 1 capsule (100 mg total) by mouth 2 (two) times daily. 12/13/18   Carlisle Cater, PA-C  glucose blood (ONETOUCH VERIO) test strip Use to check blood sugars up to 4 times  daily 07/03/17   Terald Sleeper, PA-C  levothyroxine (SYNTHROID) 100 MCG tablet TAKE 1 TABLET BY MOUTH ONCE DAILY BEFORE BREAKFAST 09/30/18   Terald Sleeper, PA-C  lisinopril (ZESTRIL) 10 MG tablet Take 1 tablet (10 mg total) by mouth daily. 09/30/18   Terald Sleeper, PA-C  metFORMIN (GLUCOPHAGE) 1000 MG tablet TAKE 1 TABLET BY MOUTH TWICE DAILY WITH A MEAL 09/30/18   Terald Sleeper, PA-C  omeprazole (PRILOSEC) 20 MG capsule Take 1 capsule (20 mg total) by mouth daily. 09/30/18   Terald Sleeper, PA-C  ONETOUCH DELICA LANCETS 41U MISC Use to check blood sugars up to 4 times daily 07/03/17   Terald Sleeper, PA-C    Family History Family History  Problem Relation Age of Onset  . Heart attack Mother   . Cancer Father        brain  . Heart failure Father   . Cancer Brother        Prostate  . Leukemia Brother   . Dementia Sister        alzheimers  . Heart disease Sister   . Diabetes Sister   . Hypertension Sister   . Hyperlipidemia Sister   . Heart failure Sister     Social History Social History   Tobacco Use  . Smoking status: Never Smoker  . Smokeless tobacco: Never Used  Substance Use Topics  . Alcohol use: No  . Drug use: No     Allergies   Penicillins, Shrimp [shellfish allergy], and Sulfa antibiotics   Review of Systems Review of Systems  All other systems reviewed and are negative.    Physical Exam Updated Vital Signs BP (!) 165/69 (BP Location: Left Arm)   Pulse 80   Temp 98.2 F (36.8 C) (Oral)   Resp 16   SpO2 98%   Physical Exam Vitals signs and nursing note reviewed.  Constitutional:      General: She is not in acute distress.    Appearance: She is well-developed. She is obese.  HENT:     Head: Normocephalic and atraumatic.  Eyes:     Pupils: Pupils are equal, round, and reactive to light.  Cardiovascular:     Rate and Rhythm: Normal rate.  Pulmonary:     Effort: Pulmonary effort is normal. No respiratory distress.  Musculoskeletal: Normal range of  motion.        General: Tenderness present.       Hands:     Comments: No edema  Skin:    General: Skin is warm and dry.     Findings: No rash.  Neurological:     Mental Status: She is alert and oriented to person, place, and time. Mental status is at baseline.     Cranial Nerves: No cranial nerve deficit.  Psychiatric:  Behavior: Behavior normal.      ED Treatments / Results  Labs (all labs ordered are listed, but only abnormal results are displayed) Labs Reviewed - No data to display  EKG None  Radiology No results found.  Procedures Procedures (including critical care time)  Medications Ordered in ED Medications - No data to display   Initial Impression / Assessment and Plan / ED Course  I have reviewed the triage vital signs and the nursing notes.  Pertinent labs & imaging results that were available during my care of the patient were reviewed by me and considered in my medical decision making (see chart for details).       Patient is an elderly female with a history of diabetes with a cat bite 5 days ago presenting today with drainage from wounds despite being on antibiotics.  Patient states that she started the antibiotics on Monday 2 days after the incident with doxycycline and clindamycin due to a penicillin allergy.  She also received Rocephin IM in her doctor's office.  The swelling and pain was worsening until yesterday when it started draining.  Continues to drain today but states the pain and swelling has improved some.  She got another dose of Rocephin today and they attempted to get an appointment with hand surgery but were unable and recommended she come to the emergency room.  Patient does have swelling and pain around the bite sites but no significant cellulitis or swelling going up the arm.  She is otherwise well-appearing.  Tetanus shot is up-to-date and cat is currently in quarantine and not displaying any abnormal behaviors.  Will discuss with  hand surgery.  6:20 PM Spoke with Dr. Caralyn Guile who will see the pt tomorrow at 3pm.  Pt was splinted so that her hand was immobile.  Final Clinical Impressions(s) / ED Diagnoses   Final diagnoses:  Cat bite of right hand with infection, sequela    ED Discharge Orders    None       Blanchie Dessert, MD 12/16/18 1820

## 2018-12-16 NOTE — Patient Instructions (Signed)
You were given another dose of Rocephin today.  I am sending you urgently to hand surgery since you are having such significant pain and now purulence from your wound.   Animal Bite, Adult Animal bite wounds can be mild or serious. It is important to get medical treatment to prevent infection. Ask your doctor if you need treatment to prevent an infection that can spread from animals to humans (rabies). Follow these instructions at home: Wound care   Follow instructions from your doctor about how to take care of your wound. Make sure you: ? Wash your hands with soap and water before you change your bandage (dressing). If you cannot use soap and water, use hand sanitizer. ? Change your bandage as told by your doctor. ? Leave stitches (sutures), skin glue, or skin tape (adhesive) strips in place. They may need to stay in place for 2 weeks or longer. If tape strips get loose and curl up, you may trim the loose edges. Do not remove tape strips completely unless your doctor says it is okay.  Check your wound every day for signs of infection. Check for: ? More redness, swelling, or pain. ? More fluid or blood. ? Warmth. ? Pus or a bad smell. Medicines  Take or apply over-the-counter and prescription medicines only as told by your doctor.  If you were prescribed an antibiotic, take or apply it as told by your doctor. Do not stop using the antibiotic even if your wound gets better. General instructions   Keep the injured area raised (elevated) above the level of your heart while you are sitting or lying down.  If directed, put ice on the injured area. ? Put ice in a plastic bag. ? Place a towel between your skin and the bag. ? Leave the ice on for 20 minutes, 2-3 times per day.  Keep all follow-up visits as told by your doctor. This is important. Contact a doctor if:  You have more redness, swelling, or pain around your wound.  Your wound feels warm to the touch.  You have a fever or  chills.  You have a general feeling of sickness (malaise).  You feel sick to your stomach (nauseous).  You throw up (vomit).  You have pain that does not get better. Get help right away if:  You have a red streak going away from your wound.  You have any of these coming from your wound: ? Non-clear fluid. ? More blood. ? Pus or a bad smell.  You have trouble moving your injured area.  You lose feeling (have numbness) or feel tingling anywhere on your body. Summary  It is important to get the right medical treatment for animal bites. Treatment can help you to not get an infection. Ask your doctor if you need treatment to prevent an infection that can spread from animals to humans (rabies).  Check your wound every day for signs of infection, such as more redness or swelling instead of less.  If you have a red streak going away from your wound, get medical help right away. This information is not intended to replace advice given to you by your health care provider. Make sure you discuss any questions you have with your health care provider. Document Released: 05/13/2005 Document Revised: 05/08/2017 Document Reviewed: 11/21/2016 Elsevier Patient Education  2020 [employer].

## 2018-12-16 NOTE — Discharge Instructions (Signed)
Continue you antibiotic and try to elevate the hand when you can.  Do not take off splint.

## 2018-12-16 NOTE — ED Notes (Signed)
Discharge summary reviewed with pt. Pt verbalized understanding of instructions. Pt unable to sign due to no signature pad.

## 2018-12-17 DIAGNOSIS — S61451A Open bite of right hand, initial encounter: Secondary | ICD-10-CM | POA: Diagnosis not present

## 2018-12-18 DIAGNOSIS — M79641 Pain in right hand: Secondary | ICD-10-CM | POA: Diagnosis not present

## 2018-12-22 DIAGNOSIS — S61451A Open bite of right hand, initial encounter: Secondary | ICD-10-CM | POA: Diagnosis not present

## 2019-02-25 DIAGNOSIS — R69 Illness, unspecified: Secondary | ICD-10-CM | POA: Diagnosis not present

## 2019-04-14 ENCOUNTER — Telehealth: Payer: Self-pay | Admitting: *Deleted

## 2019-04-14 NOTE — Telephone Encounter (Signed)
Appt scheduled for patient

## 2019-04-14 NOTE — Telephone Encounter (Signed)
They wanted to call and let you know patient has not filled lisinopril or metformin in the last few months.

## 2019-04-14 NOTE — Telephone Encounter (Signed)
Noted, was supposed to be seen this month for 6 month follow up

## 2019-04-25 ENCOUNTER — Other Ambulatory Visit: Payer: Self-pay | Admitting: Physician Assistant

## 2019-04-25 MED ORDER — ATORVASTATIN CALCIUM 10 MG PO TABS: 10.0000 mg | ORAL_TABLET | Freq: Every day | ORAL | 3 refills | Status: DC

## 2019-04-27 ENCOUNTER — Telehealth: Payer: Self-pay | Admitting: Physician Assistant

## 2019-04-27 NOTE — Telephone Encounter (Signed)
Pt called  - she has picked up Atorvastatin. She is wondering why. ( has taken one)   She has never had this, no recent lipid panel.   She thinks this was an error. Please advise

## 2019-04-27 NOTE — Telephone Encounter (Signed)
Yes, for any diabetic they should be on a statin to prevent heart disease. Thanks

## 2019-04-27 NOTE — Telephone Encounter (Signed)
Aware. 

## 2019-05-07 ENCOUNTER — Other Ambulatory Visit: Payer: Self-pay

## 2019-05-07 ENCOUNTER — Encounter: Payer: Self-pay | Admitting: Physician Assistant

## 2019-05-07 ENCOUNTER — Ambulatory Visit (INDEPENDENT_AMBULATORY_CARE_PROVIDER_SITE_OTHER): Payer: Medicare HMO | Admitting: Physician Assistant

## 2019-05-07 VITALS — BP 136/73 | HR 66 | Temp 98.7°F | Resp 20 | Ht 58.5 in | Wt 215.0 lb

## 2019-05-07 DIAGNOSIS — E039 Hypothyroidism, unspecified: Secondary | ICD-10-CM | POA: Diagnosis not present

## 2019-05-07 DIAGNOSIS — Z23 Encounter for immunization: Secondary | ICD-10-CM | POA: Diagnosis not present

## 2019-05-07 DIAGNOSIS — E119 Type 2 diabetes mellitus without complications: Secondary | ICD-10-CM

## 2019-05-07 DIAGNOSIS — I10 Essential (primary) hypertension: Secondary | ICD-10-CM

## 2019-05-07 DIAGNOSIS — F3341 Major depressive disorder, recurrent, in partial remission: Secondary | ICD-10-CM

## 2019-05-07 DIAGNOSIS — R69 Illness, unspecified: Secondary | ICD-10-CM | POA: Diagnosis not present

## 2019-05-07 LAB — BAYER DCA HB A1C WAIVED: HB A1C (BAYER DCA - WAIVED): 8.5 % — ABNORMAL HIGH (ref <7.0)

## 2019-05-07 MED ORDER — ESCITALOPRAM OXALATE 10 MG PO TABS: 10.0000 mg | ORAL_TABLET | Freq: Every day | ORAL | 1 refills | Status: DC

## 2019-05-07 MED ORDER — BUPROPION HCL ER (XL) 150 MG PO TB24: 150.0000 mg | ORAL_TABLET | Freq: Every day | ORAL | 2 refills | Status: DC

## 2019-05-07 NOTE — Progress Notes (Signed)
Acute Office Visit  Subjective:    Patient ID: [name], female    DOB: 05/05/1951, 68 y.o.   MRN: [id number]  Chief Complaint  Patient presents with  . Medical Management of Chronic Issues    6 mo   . Hypertension  . Diabetes  . Hypothyroidism    Hypertension This is a chronic problem. The current episode started more than 1 year ago. The problem is unchanged. The problem is controlled. Pertinent negatives include no blurred vision, orthopnea, palpitations or shortness of breath. There are no associated agents to hypertension. Risk factors for coronary artery disease include diabetes mellitus. Past treatments include ACE inhibitors. The current treatment provides significant improvement. There are no compliance problems.  There is no history of angina.  Diabetes She presents for her follow-up diabetic visit. She has type 2 diabetes mellitus. The initial diagnosis of diabetes was made 5 years ago. Her disease course has been fluctuating. Hypoglycemia symptoms include nervousness/anxiousness. Associated symptoms include fatigue. Pertinent negatives for diabetes include no blurred vision, no polydipsia, no polyphagia, no polyuria and no weight loss. There are no hypoglycemic complications.  Depression        This is a chronic problem.  The current episode started more than 1 year ago.   The onset quality is undetermined.   The problem occurs daily.  The most recent episode lasted 10 years.    The problem has been waxing and waning since onset.  Associated symptoms include decreased concentration, fatigue, irritable, decreased interest and sad.  Associated symptoms include no suicidal ideas.     The symptoms are aggravated by family issues and social issues.  Past treatments include other medications and SSRIs - Selective serotonin reuptake inhibitors.  Compliance with treatment is good.  Previous treatment provided mild relief.    Depression screen [facility] 2/9 05/07/2019 12/14/2018  06/01/2018 01/21/2018 12/18/2017  Decreased Interest 3 0 '1 2 2  ' Down, Depressed, Hopeless 3 0 '1 2 2  ' PHQ - 2 Score 6 0 '2 4 4  ' Altered sleeping 1 - 1 0 0  Tired, decreased energy 3 - '1 2 2  ' Change in appetite 1 - '1 3 3  ' Feeling bad or failure about yourself  3 - '2 2 2  ' Trouble concentrating 3 - '1 2 2  ' Moving slowly or fidgety/restless 0 - 0 0 0  Suicidal thoughts 0 - 0 0 0  PHQ-9 Score 17 - '8 13 13  ' Difficult doing work/chores Somewhat difficult - - - -  Some recent data might be hidden    Past Medical History:  Diagnosis Date  . Arthritis   . Depression    ONGOING PROBLEM  . Diabetes mellitus without complication ([place])    DX 2008   USES PILLS  . Dizziness and giddiness 06/29/2015  . GERD (gastroesophageal reflux disease)   . Hypertension   . Hypothyroidism   . Morbidly obese ([place])   . Morton's neuroma of left foot     Past Surgical History:  Procedure Laterality Date  . CARPAL TUNNEL RELEASE     LEFT  . CARPAL TUNNEL RELEASE Right 12/22/2012   Procedure: CARPAL TUNNEL RELEASE- RIGHT;  Surgeon: Meredith Pel, MD;  Location: [facility];  Service: Orthopedics;  Laterality: Right;  . SHOULDER ARTHROSCOPY W/ ROTATOR CUFF REPAIR     LEFT  . SHOULDER ARTHROSCOPY WITH ROTATOR CUFF REPAIR Right 12/22/2012   Procedure: SHOULDER ARTHROSCOPY WITH ROTATOR CUFF REPAIR AND BICEPS TENODESIS REPAIR;  Surgeon: Belenda Cruise  Alphonzo Severance, MD;  Location: [facility];  Service: Orthopedics;  Laterality: Right;  Right Carpal Tunnel Release, Right Shoulder Arthroscopy, Rotator Cuff Tear Repair, Biceps Tenodesis  . TOE SURGERY     LEFT BIG TOE  . TUBAL LIGATION      Family History  Problem Relation Age of Onset  . Heart attack Mother   . Cancer Father        brain  . Heart failure Father   . Cancer Brother        Prostate  . Leukemia Brother   . Dementia Sister        alzheimers  . Heart disease Sister   . Diabetes Sister   . Hypertension Sister   . Hyperlipidemia Sister   . Heart failure Sister      Social History   Socioeconomic History  . Marital status: Widowed    Spouse name: Not on file  . Number of children: 1  . Years of education: 18  . Highest education level: Not on file  Occupational History  . Occupation: disability  Tobacco Use  . Smoking status: Never Smoker  . Smokeless tobacco: Never Used  Substance and Sexual Activity  . Alcohol use: No  . Drug use: No  . Sexual activity: Not on file  Other Topics Concern  . Not on file  Social History Narrative   Patient drinks 1 soda daily.   Patient is left handed.    Social Determinants of Health   Financial Resource Strain:   . Difficulty of Paying Living Expenses: Not on file  Food Insecurity:   . Worried About Charity fundraiser in the Last Year: Not on file  . Ran Out of Food in the Last Year: Not on file  Transportation Needs:   . Lack of Transportation (Medical): Not on file  . Lack of Transportation (Non-Medical): Not on file  Physical Activity:   . Days of Exercise per Week: Not on file  . Minutes of Exercise per Session: Not on file  Stress:   . Feeling of Stress : Not on file  Social Connections:   . Frequency of Communication with Friends and Family: Not on file  . Frequency of Social Gatherings with Friends and Family: Not on file  . Attends Religious Services: Not on file  . Active Member of Clubs or Organizations: Not on file  . Attends Archivist Meetings: Not on file  . Marital Status: Not on file  Intimate Partner Violence:   . Fear of Current or Ex-Partner: Not on file  . Emotionally Abused: Not on file  . Physically Abused: Not on file  . Sexually Abused: Not on file    Outpatient Medications Prior to Visit  Medication Sig Dispense Refill  . aspirin EC 81 MG tablet Take 81 mg by mouth daily.    Marland Kitchen atorvastatin (LIPITOR) 10 MG tablet Take 1 tablet (10 mg total) by mouth daily. 90 tablet 3  . blood glucose meter kit and supplies KIT Dispense based on patient and  insurance preference. Use up to four times daily as directed. (FOR ICD-9 250.00, 250.01). 1 each 11  . buPROPion (WELLBUTRIN XL) 150 MG 24 hr tablet Take 2 tablets (300 mg total) by mouth daily. (Needs to be seen before next refill) 60 tablet 11  . glucose blood (ONETOUCH VERIO) test strip Use to check blood sugars up to 4 times daily 400 each 3  . levothyroxine (SYNTHROID) 100 MCG tablet TAKE 1  TABLET BY MOUTH ONCE DAILY BEFORE BREAKFAST 90 tablet 3  . lisinopril (ZESTRIL) 10 MG tablet Take 1 tablet (10 mg total) by mouth daily. 90 tablet 3  . metFORMIN (GLUCOPHAGE) 1000 MG tablet TAKE 1 TABLET BY MOUTH TWICE DAILY WITH A MEAL 60 tablet 5  . omeprazole (PRILOSEC) 20 MG capsule Take 1 capsule (20 mg total) by mouth daily. 90 capsule 3  . ONETOUCH DELICA LANCETS 91M MISC Use to check blood sugars up to 4 times daily 400 each 3  . clindamycin (CLEOCIN) 150 MG capsule Take 2 capsules (300 mg total) by mouth every 6 (six) hours. 56 capsule 0  . doxycycline (VIBRAMYCIN) 100 MG capsule Take 1 capsule (100 mg total) by mouth 2 (two) times daily. 14 capsule 0   No facility-administered medications prior to visit.    Allergies  Allergen Reactions  . Penicillins Other (See Comments)    Reaction unknown  . Shrimp [Shellfish Allergy] Itching  . Sulfa Antibiotics Other (See Comments)    Reaction unknown    Review of Systems  Constitutional: Positive for fatigue. Negative for weight loss.  Eyes: Negative for blurred vision.  Respiratory: Negative for shortness of breath.   Cardiovascular: Negative for palpitations and orthopnea.  Endocrine: Negative for polydipsia, polyphagia and polyuria.  Musculoskeletal: Positive for arthralgias and back pain.  Psychiatric/Behavioral: Positive for decreased concentration, depression and dysphoric mood. Negative for suicidal ideas. The patient is nervous/anxious.        Objective:    Physical Exam Constitutional:      General: She is irritable. She is not  in acute distress.    Appearance: Normal appearance. She is well-developed.  HENT:     Head: Normocephalic and atraumatic.  Cardiovascular:     Rate and Rhythm: Normal rate.  Pulmonary:     Effort: Pulmonary effort is normal.  Musculoskeletal:        General: Swelling and tenderness present.  Skin:    General: Skin is warm and dry.     Findings: No rash.  Neurological:     Mental Status: She is alert and oriented to person, place, and time.     Deep Tendon Reflexes: Reflexes are normal and symmetric.     BP 136/73   Pulse 66   Temp 98.7 F (37.1 C)   Resp 20   Ht 4' 10.5" (1.486 m)   Wt 215 lb (97.5 kg)   SpO2 98%   BMI 44.17 kg/m  Wt Readings from Last 3 Encounters:  05/07/19 215 lb (97.5 kg)  12/16/18 209 lb (94.8 kg)  12/14/18 205 lb 3.2 oz (93.1 kg)    Health Maintenance Due  Topic Date Due  . COLONOSCOPY  08/29/2011  . DEXA SCAN  05/28/2017  . FOOT EXAM  09/03/2017  . PNA vac Low Risk Adult (2 of 2 - PPSV23) 08/08/2018  . HEMOGLOBIN A1C  03/31/2019    There are no preventive care reminders to display for this patient.   Lab Results  Component Value Date   TSH 0.932 06/02/2018   Lab Results  Component Value Date   WBC 8.1 06/02/2018   HGB 10.7 (L) 06/02/2018   HCT 34.0 06/02/2018   MCV 84 06/02/2018   PLT 304 06/02/2018   Lab Results  Component Value Date   NA 141 06/02/2018   K 3.8 06/02/2018   CO2 22 06/02/2018   GLUCOSE 161 (H) 06/02/2018   BUN 11 06/02/2018   CREATININE 0.78 06/02/2018   BILITOT 0.3  06/02/2018   ALKPHOS 64 06/02/2018   AST 20 06/02/2018   ALT 27 06/02/2018   PROT 6.7 06/02/2018   ALBUMIN 4.2 06/02/2018   CALCIUM 9.6 06/02/2018   Lab Results  Component Value Date   CHOL 169 06/02/2018   Lab Results  Component Value Date   HDL 53 06/02/2018   Lab Results  Component Value Date   LDLCALC 88 06/02/2018   Lab Results  Component Value Date   TRIG 138 06/02/2018   Lab Results  Component Value Date   CHOLHDL  3.2 06/02/2018   Lab Results  Component Value Date   HGBA1C 7.6 (H) 09/28/2018       Assessment & Plan:    1. Hypothyroidism, unspecified type - TSH  2. Type 2 diabetes mellitus without complication, without long-term current use of insulin (HCC) - Bayer DCA Hb A1c Waived - Lipid panel - CMP14+EGFR - CBC with Differential/Platelet - Microalbumin / creatinine urine ratio  3. Essential hypertension - CMP14+EGFR  4. Recurrent major depressive disorder, in partial remission ([facility]) - buPROPion (WELLBUTRIN XL) 150 MG 24 hr tablet; Take 1 tablet (150 mg total) by mouth daily. (Needs to be seen before next refill)  Dispense: 30 tablet; Refill: 2 - escitalopram (LEXAPRO) 10 MG tablet; Take 1 tablet (10 mg total) by mouth daily.  Dispense: 30 tablet; Refill: [place] PA-C [place] [address]  [place], [place] [address] [phone]

## 2019-05-08 LAB — CMP14+EGFR
ALT: 25 IU/L (ref 0–32)
AST: 21 IU/L (ref 0–40)
Albumin/Globulin Ratio: 1.6 (ref 1.2–2.2)
Albumin: 3.9 g/dL (ref 3.8–4.8)
Alkaline Phosphatase: 59 IU/L (ref 39–117)
BUN/Creatinine Ratio: 16 (ref 12–28)
BUN: 12 mg/dL (ref 8–27)
Bilirubin Total: 0.3 mg/dL (ref 0.0–1.2)
CO2: 23 mmol/L (ref 20–29)
Calcium: 9.5 mg/dL (ref 8.7–10.3)
Chloride: 105 mmol/L (ref 96–106)
Creatinine, Ser: 0.74 mg/dL (ref 0.57–1.00)
GFR calc Af Amer: 98 mL/min/{1.73_m2} (ref >59)
GFR calc non Af Amer: 85 mL/min/{1.73_m2} (ref >59)
Globulin, Total: 2.4 g/dL (ref 1.5–4.5)
Glucose: 215 mg/dL — ABNORMAL HIGH (ref 65–99)
Potassium: 4.4 mmol/L (ref 3.5–5.2)
Sodium: 142 mmol/L (ref 134–144)
Total Protein: 6.3 g/dL (ref 6.0–8.5)

## 2019-05-08 LAB — LIPID PANEL
Chol/HDL Ratio: 2 ratio (ref 0.0–4.4)
Cholesterol, Total: 121 mg/dL (ref 100–199)
HDL: 60 mg/dL (ref >39)
LDL Chol Calc (NIH): 42 mg/dL (ref 0–99)
Triglycerides: 100 mg/dL (ref 0–149)
VLDL Cholesterol Cal: 19 mg/dL (ref 5–40)

## 2019-05-08 LAB — CBC WITH DIFFERENTIAL/PLATELET
Basophils Absolute: 0.1 10*3/uL (ref 0.0–0.2)
Basos: 1 %
EOS (ABSOLUTE): 0.1 10*3/uL (ref 0.0–0.4)
Eos: 2 %
Hematocrit: 34 % (ref 34.0–46.6)
Hemoglobin: 10.9 g/dL — ABNORMAL LOW (ref 11.1–15.9)
Immature Grans (Abs): 0.1 10*3/uL (ref 0.0–0.1)
Immature Granulocytes: 1 %
Lymphocytes Absolute: 2.3 10*3/uL (ref 0.7–3.1)
Lymphs: 29 %
MCH: 27.3 pg (ref 26.6–33.0)
MCHC: 32.1 g/dL (ref 31.5–35.7)
MCV: 85 fL (ref 79–97)
Monocytes Absolute: 0.4 10*3/uL (ref 0.1–0.9)
Monocytes: 5 %
Neutrophils Absolute: 5 10*3/uL (ref 1.4–7.0)
Neutrophils: 62 %
Platelets: 260 10*3/uL (ref 150–450)
RBC: 4 x10E6/uL (ref 3.77–5.28)
RDW: 14.8 % (ref 11.7–15.4)
WBC: 8 10*3/uL (ref 3.4–10.8)

## 2019-05-08 LAB — TSH: TSH: 0.686 u[IU]/mL (ref 0.450–4.500)

## 2019-05-08 LAB — MICROALBUMIN / CREATININE URINE RATIO
Creatinine, Urine: 211.8 mg/dL
Microalb/Creat Ratio: 13 mg/g creat (ref 0–29)
Microalbumin, Urine: 27 ug/mL

## 2019-05-10 ENCOUNTER — Emergency Department (HOSPITAL_COMMUNITY)
Admission: EM | Admit: 2019-05-10 | Discharge: 2019-05-10 | Disposition: A | Discharge status: Home / Self Care | Payer: Medicare HMO | Attending: Emergency Medicine | Admitting: Emergency Medicine

## 2019-05-10 ENCOUNTER — Encounter (HOSPITAL_COMMUNITY): Payer: Self-pay | Admitting: Emergency Medicine

## 2019-05-10 ENCOUNTER — Other Ambulatory Visit: Payer: Self-pay

## 2019-05-10 ENCOUNTER — Emergency Department (HOSPITAL_COMMUNITY): Payer: Medicare HMO

## 2019-05-10 DIAGNOSIS — E039 Hypothyroidism, unspecified: Secondary | ICD-10-CM | POA: Diagnosis not present

## 2019-05-10 DIAGNOSIS — Z79899 Other long term (current) drug therapy: Secondary | ICD-10-CM | POA: Insufficient documentation

## 2019-05-10 DIAGNOSIS — R410 Disorientation, unspecified: Secondary | ICD-10-CM | POA: Diagnosis not present

## 2019-05-10 DIAGNOSIS — R42 Dizziness and giddiness: Secondary | ICD-10-CM | POA: Insufficient documentation

## 2019-05-10 DIAGNOSIS — E1165 Type 2 diabetes mellitus with hyperglycemia: Secondary | ICD-10-CM | POA: Diagnosis not present

## 2019-05-10 DIAGNOSIS — Z7982 Long term (current) use of aspirin: Secondary | ICD-10-CM | POA: Diagnosis not present

## 2019-05-10 DIAGNOSIS — Z7984 Long term (current) use of oral hypoglycemic drugs: Secondary | ICD-10-CM | POA: Insufficient documentation

## 2019-05-10 DIAGNOSIS — R739 Hyperglycemia, unspecified: Secondary | ICD-10-CM

## 2019-05-10 DIAGNOSIS — I10 Essential (primary) hypertension: Secondary | ICD-10-CM | POA: Diagnosis not present

## 2019-05-10 DIAGNOSIS — G459 Transient cerebral ischemic attack, unspecified: Secondary | ICD-10-CM | POA: Diagnosis not present

## 2019-05-10 LAB — COMPREHENSIVE METABOLIC PANEL
ALT: 30 U/L (ref 0–44)
AST: 28 U/L (ref 15–41)
Albumin: 3.6 g/dL (ref 3.5–5.0)
Alkaline Phosphatase: 50 U/L (ref 38–126)
Anion gap: 8 (ref 5–15)
BUN: 16 mg/dL (ref 8–23)
CO2: 27 mmol/L (ref 22–32)
Calcium: 8.9 mg/dL (ref 8.9–10.3)
Chloride: 103 mmol/L (ref 98–111)
Creatinine, Ser: 0.83 mg/dL (ref 0.44–1.00)
GFR calc Af Amer: >60 mL/min (ref >60)
GFR calc non Af Amer: >60 mL/min (ref >60)
Glucose, Bld: 291 mg/dL — ABNORMAL HIGH (ref 70–99)
Potassium: 4.1 mmol/L (ref 3.5–5.1)
Sodium: 138 mmol/L (ref 135–145)
Total Bilirubin: 0.3 mg/dL (ref 0.3–1.2)
Total Protein: 6.8 g/dL (ref 6.5–8.1)

## 2019-05-10 LAB — DIFFERENTIAL
Abs Immature Granulocytes: 0.05 10*3/uL (ref 0.00–0.07)
Basophils Absolute: 0.1 10*3/uL (ref 0.0–0.1)
Basophils Relative: 1 %
Eosinophils Absolute: 0.1 10*3/uL (ref 0.0–0.5)
Eosinophils Relative: 2 %
Immature Granulocytes: 1 %
Lymphocytes Relative: 27 %
Lymphs Abs: 2.1 10*3/uL (ref 0.7–4.0)
Monocytes Absolute: 0.5 10*3/uL (ref 0.1–1.0)
Monocytes Relative: 6 %
Neutro Abs: 5 10*3/uL (ref 1.7–7.7)
Neutrophils Relative %: 63 %

## 2019-05-10 LAB — PROTIME-INR
INR: 1 (ref 0.8–1.2)
Prothrombin Time: 13.1 seconds (ref 11.4–15.2)

## 2019-05-10 LAB — CBC
HCT: 34.1 % — ABNORMAL LOW (ref 36.0–46.0)
Hemoglobin: 10.2 g/dL — ABNORMAL LOW (ref 12.0–15.0)
MCH: 27.1 pg (ref 26.0–34.0)
MCHC: 29.9 g/dL — ABNORMAL LOW (ref 30.0–36.0)
MCV: 90.7 fL (ref 80.0–100.0)
Platelets: 250 10*3/uL (ref 150–400)
RBC: 3.76 MIL/uL — ABNORMAL LOW (ref 3.87–5.11)
RDW: 15.7 % — ABNORMAL HIGH (ref 11.5–15.5)
WBC: 7.8 10*3/uL (ref 4.0–10.5)
nRBC: 0 % (ref 0.0–0.2)

## 2019-05-10 LAB — APTT: aPTT: 26 seconds (ref 24–36)

## 2019-05-10 NOTE — ED Provider Notes (Signed)
[facility] Provider Note   CSN: [id number] Arrival date & time: 05/10/19  1508     History Chief Complaint  Patient presents with  . Altered Mental Status    [name] is a 68 y.o. female with a history of depression, diabetes, hypertension, hypothyroidism presenting for evaluation of confusion and difficulty ambulating along with transient blurred vision which occurred this morning shortly after waking.  Her symptoms lasted approximately 3 hours and she now feels improved although still feels a little unsteady with ambulation.  She describes pain across her left upper back and neck which is worsened with movement and she endorses having a history of a herniated disc in her neck.  She denies weakness or numbness in her upper extremities.  She describes difficulty "making my hands work" when trying to do a solitaire puzzle on her phone in the midst of her symptoms.  She denies vertiginous symptoms, but states when she tried to walk she kept moving from left to right and was unable to walk in a straight line.  Her blood glucose when she first woke was 220.  She took her Metformin and then ate a midmorning meal.  She denies fevers or chills, she does endorse a mild left lateral headache.  HPI     Past Medical History:  Diagnosis Date  . Arthritis   . Depression    ONGOING PROBLEM  . Diabetes mellitus without complication ([place])    DX 2008   USES PILLS  . Dizziness and giddiness 06/29/2015  . GERD (gastroesophageal reflux disease)   . Hypertension   . Hypothyroidism   . Morbidly obese ([place])   . Morton's neuroma of left foot     Patient Active Problem List   Diagnosis Date Noted  . Diabetes ([place]) 05/29/2016  . Gastroesophageal reflux disease without esophagitis 05/29/2016  . Hypothyroidism 05/29/2016  . Primary osteoarthritis involving multiple joints 05/29/2016  . Essential hypertension 05/29/2016  . Recurrent major depressive disorder, in partial  remission ([place]) 05/29/2016  . Elevated cholesterol 05/29/2016  . Dizziness and giddiness 06/29/2015    Past Surgical History:  Procedure Laterality Date  . CARPAL TUNNEL RELEASE     LEFT  . CARPAL TUNNEL RELEASE Right 12/22/2012   Procedure: CARPAL TUNNEL RELEASE- RIGHT;  Surgeon: Meredith Pel, MD;  Location: [facility];  Service: Orthopedics;  Laterality: Right;  . SHOULDER ARTHROSCOPY W/ ROTATOR CUFF REPAIR     LEFT  . SHOULDER ARTHROSCOPY WITH ROTATOR CUFF REPAIR Right 12/22/2012   Procedure: SHOULDER ARTHROSCOPY WITH ROTATOR CUFF REPAIR AND BICEPS TENODESIS REPAIR;  Surgeon: Meredith Pel, MD;  Location: [facility];  Service: Orthopedics;  Laterality: Right;  Right Carpal Tunnel Release, Right Shoulder Arthroscopy, Rotator Cuff Tear Repair, Biceps Tenodesis  . TOE SURGERY     LEFT BIG TOE  . TUBAL LIGATION       OB History   No obstetric history on file.     Family History  Problem Relation Age of Onset  . Heart attack Mother   . Cancer Father        brain  . Heart failure Father   . Cancer Brother        Prostate  . Leukemia Brother   . Dementia Sister        alzheimers  . Heart disease Sister   . Diabetes Sister   . Hypertension Sister   . Hyperlipidemia Sister   . Heart failure Sister     Social  History   Tobacco Use  . Smoking status: Never Smoker  . Smokeless tobacco: Never Used  Substance Use Topics  . Alcohol use: No  . Drug use: No    Home Medications Prior to Admission medications   Medication Sig Start Date End Date Taking? Authorizing Provider  aspirin EC 81 MG tablet Take 81 mg by mouth daily.    [provider]  atorvastatin (LIPITOR) 10 MG tablet Take 1 tablet (10 mg total) by mouth daily. 04/25/19   Terald Sleeper, PA-C  blood glucose meter kit and supplies KIT Dispense based on patient and insurance preference. Use up to four times daily as directed. (FOR ICD-9 250.00, 250.01). 05/29/16   Terald Sleeper, PA-C  buPROPion (WELLBUTRIN  XL) 150 MG 24 hr tablet Take 1 tablet (150 mg total) by mouth daily. (Needs to be seen before next refill) 05/07/19   Terald Sleeper, PA-C  escitalopram (LEXAPRO) 10 MG tablet Take 1 tablet (10 mg total) by mouth daily. 05/07/19   Terald Sleeper, PA-C  glucose blood (ONETOUCH VERIO) test strip Use to check blood sugars up to 4 times daily 07/03/17   Terald Sleeper, PA-C  levothyroxine (SYNTHROID) 100 MCG tablet TAKE 1 TABLET BY MOUTH ONCE DAILY BEFORE BREAKFAST 09/30/18   Terald Sleeper, PA-C  lisinopril (ZESTRIL) 10 MG tablet Take 1 tablet (10 mg total) by mouth daily. 09/30/18   Terald Sleeper, PA-C  metFORMIN (GLUCOPHAGE) 1000 MG tablet TAKE 1 TABLET BY MOUTH TWICE DAILY WITH A MEAL 09/30/18   Terald Sleeper, PA-C  omeprazole (PRILOSEC) 20 MG capsule Take 1 capsule (20 mg total) by mouth daily. 09/30/18   Terald Sleeper, PA-C  ONETOUCH DELICA LANCETS 77A MISC Use to check blood sugars up to 4 times daily 07/03/17   Terald Sleeper, PA-C    Allergies    Penicillins, Shrimp [shellfish allergy], and Sulfa antibiotics  Review of Systems   Review of Systems  Constitutional: Negative for fever.  HENT: Negative for congestion and sore throat.   Eyes: Positive for visual disturbance.  Respiratory: Negative for chest tightness and shortness of breath.   Cardiovascular: Negative for chest pain.  Gastrointestinal: Negative for abdominal pain, nausea and vomiting.  Genitourinary: Negative.   Musculoskeletal: Negative for arthralgias, joint swelling and neck pain.  Skin: Negative.  Negative for rash and wound.  Neurological: Positive for dizziness and headaches. Negative for syncope, weakness, light-headedness and numbness.  Psychiatric/Behavioral: Negative.     Physical Exam Updated Vital Signs BP (!) 167/74 (BP Location: Right Arm)   Pulse (!) 59   Temp 97.9 F (36.6 C) (Oral)   Ht '4\' 10"'  (1.473 m)   Wt 97.1 kg   SpO2 100%   BMI 44.73 kg/m   Physical Exam Vitals and nursing note reviewed.    Constitutional:      Appearance: She is well-developed.  HENT:     Head: Normocephalic and atraumatic.  Eyes:     Extraocular Movements: Extraocular movements intact.     Conjunctiva/sclera: Conjunctivae normal.     Pupils: Pupils are equal, round, and reactive to light.  Cardiovascular:     Rate and Rhythm: Normal rate and regular rhythm.     Heart sounds: Normal heart sounds.  Pulmonary:     Effort: Pulmonary effort is normal.     Breath sounds: Normal breath sounds. No wheezing.  Abdominal:     General: Bowel sounds are normal.     Palpations: Abdomen  is soft.     Tenderness: There is no abdominal tenderness.  Musculoskeletal:        General: Normal range of motion.     Cervical back: Normal range of motion.  Skin:    General: Skin is warm and dry.  Neurological:     General: No focal deficit present.     Mental Status: She is alert and oriented to person, place, and time.     Cranial Nerves: Cranial nerves are intact.     Sensory: Sensation is intact.     Motor: Motor function is intact. No weakness.     Coordination: Impaired rapid alternating movements.     Gait: Gait is intact.     Comments: Equal grip strength.  Patient is slow with rapid alternating movements.  Finger-nose test is adequate using her right dominant hand, she had slow response using her left hand, no past-pointing.     ED Results / Procedures / Treatments   Labs (all labs ordered are listed, but only abnormal results are displayed) Labs Reviewed  CBC - Abnormal; Notable for the following components:      Result Value   RBC 3.76 (*)    Hemoglobin 10.2 (*)    HCT 34.1 (*)    MCHC 29.9 (*)    RDW 15.7 (*)    All other components within normal limits  COMPREHENSIVE METABOLIC PANEL - Abnormal; Notable for the following components:   Glucose, Bld 291 (*)    All other components within normal limits  PROTIME-INR  APTT  DIFFERENTIAL    EKG ED ECG REPORT   Date: 05/10/2019  Rate: 61   Rhythm: normal sinus rhythm  QRS Axis: normal  Intervals: normal  ST/T Wave abnormalities: nonspecific T wave changes  Conduction Disutrbances:none  Narrative Interpretation: non specific T wave changes in II and III.   Old EKG Reviewed: changes noted  I have personally reviewed the EKG tracing and agree with the computerized printout as noted.   Radiology CT HEAD WO CONTRAST  Result Date: 05/10/2019 CLINICAL DATA:  Confusion with blurred vision EXAM: CT HEAD WITHOUT CONTRAST TECHNIQUE: Contiguous axial images were obtained from the base of the skull through the vertex without intravenous contrast. COMPARISON:  None. FINDINGS: Brain: Ventricles are normal in size and configuration. There is no intracranial mass, hemorrhage, extra-axial fluid collection, or midline shift. The brain parenchyma appears unremarkable. No acute infarct is evident. Vascular: There is no hyperdense vessel. There are foci of calcification in each carotid siphon region. Skull: The bony calvarium appears intact. Sinuses/Orbits: Visualized paranasal sinuses are clear. Orbits appear symmetric bilaterally. Other: Mastoid air cells are clear. IMPRESSION: There are foci of arterial vascular calcification. Brain parenchyma appears unremarkable. No mass or hemorrhage. Electronically Signed   By: Lowella Grip III M.D.   On: 05/10/2019 15:55   MR BRAIN WO CONTRAST  Result Date: 05/10/2019 CLINICAL DATA:  Transient ischemic attack (TIA), weakness and dizziness for 2 days. EXAM: MRI HEAD WITHOUT CONTRAST TECHNIQUE: Multiplanar, multiecho pulse sequences of the brain and surrounding structures were obtained without intravenous contrast. COMPARISON:  Head CT 05/10/2019, brain MRI 07/10/2015 FINDINGS: Brain: There is no evidence of acute infarct. No evidence of intracranial mass. No midline shift or extra-axial fluid collection. No chronic intracranial blood products. A few small scattered foci of T2/FLAIR hyperintensity within the  cerebral white matter are nonspecific and not uncommonly seen in patients of this age. Cerebral volume is normal for age. Vascular: Flow voids maintained within the  proximal large arterial vessels. Skull and upper cervical spine: No focal marrow lesion Sinuses/Orbits: Visualized orbits demonstrate no acute abnormality. Minimal ethmoid sinus mucosal thickening. Small right mastoid effusion. IMPRESSION: Normal MRI appearance of the brain for age. No evidence of acute intracranial abnormality. Small right mastoid effusion. Electronically Signed   By: Kellie Simmering DO   On: 05/10/2019 18:18    Procedures Procedures (including critical care time)  Medications Ordered in ED Medications - No data to display  ED Course  I have reviewed the triage vital signs and the nursing notes.  Pertinent labs & imaging results that were available during my care of the patient were reviewed by me and considered in my medical decision making (see chart for details).    MDM Rules/Calculators/A&P   ABCD2 Score: 5                 ABCD2 score 5, moderate risk for cva.  Possible pt's sx this am was tia, now resolved. MRI negative for acute cva.  Stable for dc home.  Pt is already taking asa 81 mg daily. Affirmed need to continue taking.  Advised close f/u with pcp for a recheck within the next week. She would benefit from carotid vascular study which can be arranged as outpt.   Final Clinical Impression(s) / ED Diagnoses Final diagnoses:  Dizziness, nonspecific  Hyperglycemia    Rx / DC Orders ED Discharge Orders    None       Landis Martins 05/10/19 Yevette Edwards    Veryl Speak, MD 05/10/19 2250

## 2019-05-10 NOTE — Discharge Instructions (Addendum)
Your labs, exam and CT imaging and MRI imaging are reassuring that you did not have a stroke with todays symptoms.  I recommend a recheck by your primary MD within the next week.  Continue taking your aspirin daily which is protective against strokes (and heart and vascular problems too).  Make sure you are keeping a close watch of your blood glucose levels.

## 2019-05-10 NOTE — ED Triage Notes (Signed)
Pt states when she got up this morning she was very confused and staggering around. Her vision was blurred as well. Her symptoms are much better now. She was having some pain in her left neck.

## 2019-05-11 ENCOUNTER — Telehealth: Payer: Self-pay | Admitting: Family Medicine

## 2019-05-11 NOTE — Telephone Encounter (Signed)
I do not think so, but hold it for a couple of weeks and see

## 2019-05-11 NOTE — Telephone Encounter (Signed)
Patient aware and verbalized understanding. °

## 2019-05-11 NOTE — Telephone Encounter (Signed)
Patient went to ER yesterday for dizziness and her mind was not straight.left shoulder blade pain that went up neck. Pt states she is having trouble talking today. Patient states everything checked out fine. Wants to know if the med you put her on last week could cause these symptoms. Patient had MRI at [facility] 05/10/19. Please advise.

## 2019-05-11 NOTE — Telephone Encounter (Signed)
atorvastatin (LIPITOR) 10 MG tablet patient is taking this could this be the one that is causing the problem ?

## 2019-05-11 NOTE — Telephone Encounter (Signed)
Per Forestine Na notes from MRI patient states they want you to order an U/S of neck

## 2019-05-11 NOTE — Telephone Encounter (Signed)
Yes, the citalopram could possibly give neurologic effects. Stop today, let us know if resolves

## 2019-05-12 ENCOUNTER — Other Ambulatory Visit: Payer: Self-pay | Admitting: Physician Assistant

## 2019-05-12 DIAGNOSIS — G459 Transient cerebral ischemic attack, unspecified: Secondary | ICD-10-CM

## 2019-05-12 NOTE — Progress Notes (Signed)
neurolo

## 2019-05-12 NOTE — Telephone Encounter (Signed)
Patient aware and go ahead place referral

## 2019-05-12 NOTE — Telephone Encounter (Signed)
EXAM: MRI HEAD WITHOUT CONTRAST   TECHNIQUE: Multiplanar, multiecho pulse sequences of the brain and surrounding structures were obtained without intravenous contrast.   COMPARISON:  Head CT 05/10/2019, brain MRI 07/10/2015   FINDINGS: Brain:   There is no evidence of acute infarct.   No evidence of intracranial mass.   No midline shift or extra-axial fluid collection.   No chronic intracranial blood products.   A few small scattered foci of T2/FLAIR hyperintensity within the cerebral white matter are nonspecific and not uncommonly seen in patients of this age.   Cerebral volume is normal for age.   Vascular: Flow voids maintained within the proximal large arterial vessels.   Skull and upper cervical spine: No focal marrow lesion   Sinuses/Orbits: Visualized orbits demonstrate no acute abnormality. Minimal ethmoid sinus mucosal thickening. Small right mastoid effusion.   IMPRESSION: Normal MRI appearance of the brain for age. No evidence of acute intracranial abnormality.   Small right mastoid effusion.     Electronically Signed   By: Kellie Simmering DO   On: 05/10/2019 18:18    There is not a mention of the ultrasounds in this MRI.  Would recommend neurology referral for further workup of her symptoms

## 2019-05-12 NOTE — Telephone Encounter (Signed)
Order placed

## 2019-05-14 ENCOUNTER — Other Ambulatory Visit: Payer: Self-pay | Admitting: Physician Assistant

## 2019-05-14 ENCOUNTER — Telehealth: Payer: Self-pay | Admitting: Physician Assistant

## 2019-05-14 DIAGNOSIS — E119 Type 2 diabetes mellitus without complications: Secondary | ICD-10-CM

## 2019-05-14 NOTE — Telephone Encounter (Signed)
No results yet in epic for [employer]  - it doesn't appear that we ordered this - aware her insurance may have ordered it.   I checked epic and [employer] website.

## 2019-05-19 ENCOUNTER — Ambulatory Visit: Payer: Medicare HMO | Admitting: Neurology

## 2019-05-19 ENCOUNTER — Encounter: Payer: Self-pay | Admitting: Neurology

## 2019-05-19 ENCOUNTER — Other Ambulatory Visit: Payer: Self-pay

## 2019-05-19 VITALS — BP 154/69 | HR 57 | Temp 97.0°F | Ht <= 58 in | Wt 212.0 lb

## 2019-05-19 DIAGNOSIS — G459 Transient cerebral ischemic attack, unspecified: Secondary | ICD-10-CM

## 2019-05-19 NOTE — Progress Notes (Signed)
Reason for visit: Possible TIA  Referring physician: [facility]  [name] is a 68 y.o. female  History of present illness:  [name] is a 68 year old left-handed white female with a history of diabetes, obesity, depression, hypertension, and hypothyroidism.  The patient was seen through this office almost 4 years ago with episodes of dizziness with standing.  Work-up at that time was unremarkable.  The patient comes to the office today following an event that occurred at home on around 10 May 2019.  The patient apparently had gotten up out of bed and then noted that she was dizzy and staggery.  The patient felt confused.  She did check a blood sugar at home which was reportedly around 220.  The patient also noted blurred vision, she denied any focal numbness or weakness of the face, arms, legs.  With walking, she would stagger to the left or to the right.  She noted that her only new medication was a cholesterol lowering agent, this has been discontinued.  The patient went to the emergency room, MRI of the brain was done and was completely normal.  She indicated that the event lasted 3 hours and then abated.  The patient has returned back to her normal baseline without recurrence of symptoms.  She reported no chest pain or palpitations of the heart.  She denies any headache or loss of consciousness.  She has never had similar events previously.  She denies a prior history of migraine headache.  In retrospect, the patient indicates that she had gotten her Wellbutrin medication mixed up, she was taking a double dose prior to the onset of symptoms.  She has corrected the problem since that time.  She also indicates that she ran out of Metformin several days prior to the episode, but the ER note indicates that she took her Metformin on the morning of the event.  The patient is sent to this office for an evaluation.  Past Medical History:  Diagnosis Date  . Arthritis   . Depression    ONGOING PROBLEM  . Diabetes mellitus without complication ([place])    DX 2008   USES PILLS  . Dizziness and giddiness 06/29/2015  . GERD (gastroesophageal reflux disease)   . Hypertension   . Hypothyroidism   . Morbidly obese ([place])   . Morton's neuroma of left foot     Past Surgical History:  Procedure Laterality Date  . CARPAL TUNNEL RELEASE     LEFT  . CARPAL TUNNEL RELEASE Right 12/22/2012   Procedure: CARPAL TUNNEL RELEASE- RIGHT;  Surgeon: Meredith Pel, MD;  Location: [facility];  Service: Orthopedics;  Laterality: Right;  . SHOULDER ARTHROSCOPY W/ ROTATOR CUFF REPAIR     LEFT  . SHOULDER ARTHROSCOPY WITH ROTATOR CUFF REPAIR Right 12/22/2012   Procedure: SHOULDER ARTHROSCOPY WITH ROTATOR CUFF REPAIR AND BICEPS TENODESIS REPAIR;  Surgeon: Meredith Pel, MD;  Location: [facility];  Service: Orthopedics;  Laterality: Right;  Right Carpal Tunnel Release, Right Shoulder Arthroscopy, Rotator Cuff Tear Repair, Biceps Tenodesis  . TOE SURGERY     LEFT BIG TOE  . TUBAL LIGATION      Family History  Problem Relation Age of Onset  . Heart attack Mother   . Cancer Father        brain  . Heart failure Father   . Cancer Brother        Prostate  . Leukemia Brother   . Dementia Sister  alzheimers  . Heart disease Sister   . Diabetes Sister   . Hypertension Sister   . Hyperlipidemia Sister   . Heart failure Sister     Social history:  reports that she has never smoked. She has never used smokeless tobacco. She reports that she does not drink alcohol or use drugs.  Medications:  Prior to Admission medications   Medication Sig Start Date End Date Taking? Authorizing Provider  aspirin EC 81 MG tablet Take 81 mg by mouth daily.   Yes [provider]  blood glucose meter kit and supplies KIT Dispense based on patient and insurance preference. Use up to four times daily as directed. (FOR ICD-9 250.00, 250.01). 05/29/16  Yes Terald Sleeper, PA-C  buPROPion (WELLBUTRIN XL) 150  MG 24 hr tablet Take 1 tablet (150 mg total) by mouth daily. (Needs to be seen before next refill) 05/07/19  Yes Terald Sleeper, PA-C  escitalopram (LEXAPRO) 10 MG tablet Take 1 tablet (10 mg total) by mouth daily. 05/07/19  Yes Terald Sleeper, PA-C  glucose blood (ONETOUCH VERIO) test strip Use to check blood sugars up to 4 times daily 07/03/17  Yes Terald Sleeper, PA-C  levothyroxine (SYNTHROID) 100 MCG tablet TAKE 1 TABLET BY MOUTH ONCE DAILY BEFORE BREAKFAST 09/30/18  Yes Terald Sleeper, PA-C  lisinopril (ZESTRIL) 10 MG tablet Take 1 tablet (10 mg total) by mouth daily. 09/30/18  Yes Terald Sleeper, PA-C  metFORMIN (GLUCOPHAGE) 1000 MG tablet TAKE 1 TABLET BY MOUTH TWICE DAILY WITH A MEAL 05/14/19  Yes Terald Sleeper, PA-C  omeprazole (PRILOSEC) 20 MG capsule Take 1 capsule (20 mg total) by mouth daily. 09/30/18  Yes Terald Sleeper, PA-C  ONETOUCH DELICA LANCETS 16W MISC Use to check blood sugars up to 4 times daily 07/03/17  Yes Terald Sleeper, PA-C      Allergies  Allergen Reactions  . Penicillins Other (See Comments)    Reaction unknown  . Shrimp [Shellfish Allergy] Itching  . Sulfa Antibiotics Other (See Comments)    Reaction unknown    ROS:  Out of a complete 14 system review of symptoms, the patient complains only of the following symptoms, and all other reviewed systems are negative.  Gait instability Transient confusion  Blood pressure (!) 154/69, pulse (!) 57, temperature (!) 97 F (36.1 C), height '4\' 10"'  (1.473 m), weight 212 lb (96.2 kg).  Physical Exam  General: The patient is alert and cooperative at the time of the examination.  The patient is markedly obese.  Eyes: Pupils are equal, round, and reactive to light. Discs are flat bilaterally.  Neck: The neck is supple, no carotid bruits are noted.  Respiratory: The respiratory examination is clear.  Cardiovascular: The cardiovascular examination reveals a regular rate and rhythm, no obvious murmurs or rubs are  noted.  Skin: Extremities are without significant edema.  Neurologic Exam  Mental status: The patient is alert and oriented x 3 at the time of the examination. The patient has apparent normal recent and remote memory, with an apparently normal attention span and concentration ability.  Cranial nerves: Facial symmetry is present. There is good sensation of the face to pinprick and soft touch bilaterally. The strength of the facial muscles and the muscles to head turning and shoulder shrug are normal bilaterally. Speech is well enunciated, no aphasia or dysarthria is noted. Extraocular movements are full. Visual fields are full. The tongue is midline, and the patient has symmetric elevation of  the soft palate. No obvious hearing deficits are noted.  Motor: The motor testing reveals 5 over 5 strength of all 4 extremities. Good symmetric motor tone is noted throughout.  Sensory: Sensory testing is intact to pinprick, soft touch, vibration sensation, and position sense on the upper extremities.  With the lower extremities there is a stocking pattern pinprick sensory deficit across the ankles bilaterally with mild impairment of position sense in both feet, vibration sensation is symmetric in the feet.  No evidence of extinction is noted.  Coordination: Cerebellar testing reveals good finger-nose-finger and heel-to-shin bilaterally.  Gait and station: Gait is normal. Tandem gait is normal. Romberg is negative. No drift is seen.  Reflexes: Deep tendon reflexes are symmetric, but is slightly depressed bilaterally. Toes are downgoing bilaterally.   MRI brain 05/10/19:  IMPRESSION: Normal MRI appearance of the brain for age. No evidence of acute intracranial abnormality.  Small right mastoid effusion.  * MRI scan images were reviewed online. I agree with the written report.    Assessment/Plan:  1.  Transient event of gait instability and confusion, blurred vision  The event above could  have represented a TIA event, but the event lasted 3 hours and there was no evidence of a stroke on MRI.  The patient in retrospect notes that she had inadvertently doubled her Wellbutrin medication, this could have some impact on her current symptoms.  Her blood sugar was 220 at the time of the episode, this is not likely to be the source of her event.  The patient will be set up for CT angiogram of the head and neck.  She is allergic to shrimp but claims that she can tolerate x-ray dye without problems.  She will remain on aspirin therapy for now.  Jill Alexanders MD 05/19/2019 7:48 AM  [facility] [address] [place], [place] [address]  Phone [phone] Fax [phone]

## 2019-05-31 ENCOUNTER — Telehealth: Payer: Self-pay | Admitting: Physician Assistant

## 2019-05-31 NOTE — Telephone Encounter (Signed)
Patient aware and verbalized understanding. °

## 2019-05-31 NOTE — Telephone Encounter (Signed)
Yes these are to antidepressants, however they work in different ways.  It is okay to take them at the same time.

## 2019-05-31 NOTE — Telephone Encounter (Signed)
Pt wants to know if she is supposed to take the Bupropion and Escitalopram both each day, and if so, does she take them at the same time.

## 2019-06-09 ENCOUNTER — Ambulatory Visit
Admission: RE | Admit: 2019-06-09 | Discharge: 2019-06-09 | Disposition: A | Discharge status: Home / Self Care | Payer: Medicare HMO | Source: Ambulatory Visit | Attending: Neurology | Admitting: Neurology

## 2019-06-09 ENCOUNTER — Telehealth: Payer: Self-pay | Admitting: Neurology

## 2019-06-09 ENCOUNTER — Other Ambulatory Visit: Payer: Self-pay

## 2019-06-09 DIAGNOSIS — G3281 Cerebellar ataxia in diseases classified elsewhere: Secondary | ICD-10-CM

## 2019-06-09 DIAGNOSIS — G459 Transient cerebral ischemic attack, unspecified: Secondary | ICD-10-CM

## 2019-06-09 DIAGNOSIS — I6523 Occlusion and stenosis of bilateral carotid arteries: Secondary | ICD-10-CM | POA: Diagnosis not present

## 2019-06-09 MED ORDER — IOPAMIDOL (ISOVUE-370) INJECTION 76%
75.0000 mL | Freq: Once | INTRAVENOUS | Status: AC | PRN
  Administered 2019-06-09: 75 mL via INTRAVENOUS

## 2019-06-09 NOTE — Telephone Encounter (Signed)
   CTA head and neck 06/09/19:  IMPRESSION: CT head:  1. Apparent ill-defined hypoattenuation within portions of the right cerebellar hemisphere, not present on prior examinations. This may reflect artifact or recent right cerebellar infarct. Consider brain MRI for further evaluation. 2. Otherwise normal CT appearance of the brain for age.  CTA neck:  The common carotid, internal carotid and vertebral arteries are patent within the neck without significant stenosis.  CTA head:  Unremarkable examination. No intracranial large vessel occlusion or proximal high-grade arterial stenosis.

## 2019-06-09 NOTE — Telephone Encounter (Signed)
I called the patient.  The CT angiogram of the head and neck was relatively unremarkable but the radiologist felt that there may have been a new lesion in the right cerebellum, the prior MRI of the brain done in December was unremarkable.  There is a possibility that the lesion seen in the right cerebellum may be artifact, but they are recommending a repeat MRI of the brain to confirm.  I discussed this with the patient, I will order the study.

## 2019-06-17 ENCOUNTER — Other Ambulatory Visit: Payer: Self-pay

## 2019-06-18 ENCOUNTER — Encounter: Payer: Self-pay | Admitting: Physician Assistant

## 2019-06-18 ENCOUNTER — Ambulatory Visit (INDEPENDENT_AMBULATORY_CARE_PROVIDER_SITE_OTHER): Payer: Medicare HMO | Admitting: Physician Assistant

## 2019-06-18 VITALS — BP 133/63 | HR 57 | Temp 96.0°F | Ht <= 58 in | Wt 218.6 lb

## 2019-06-18 DIAGNOSIS — E119 Type 2 diabetes mellitus without complications: Secondary | ICD-10-CM

## 2019-06-18 DIAGNOSIS — F3341 Major depressive disorder, recurrent, in partial remission: Secondary | ICD-10-CM | POA: Diagnosis not present

## 2019-06-18 DIAGNOSIS — H9313 Tinnitus, bilateral: Secondary | ICD-10-CM | POA: Diagnosis not present

## 2019-06-18 DIAGNOSIS — K219 Gastro-esophageal reflux disease without esophagitis: Secondary | ICD-10-CM | POA: Diagnosis not present

## 2019-06-18 DIAGNOSIS — R69 Illness, unspecified: Secondary | ICD-10-CM | POA: Diagnosis not present

## 2019-06-18 MED ORDER — ESCITALOPRAM OXALATE 20 MG PO TABS: 20.0000 mg | ORAL_TABLET | Freq: Every day | ORAL | 1 refills | Status: DC

## 2019-06-18 MED ORDER — GABAPENTIN 100 MG PO CAPS: 100.0000 mg | ORAL_CAPSULE | Freq: Every day | ORAL | 3 refills | Status: DC

## 2019-06-18 MED ORDER — METFORMIN HCL 1000 MG PO TABS: 1000.0000 mg | ORAL_TABLET | Freq: Two times a day (BID) | ORAL | 3 refills | Status: DC

## 2019-06-18 MED ORDER — BUPROPION HCL ER (XL) 150 MG PO TB24: 150.0000 mg | ORAL_TABLET | Freq: Every day | ORAL | 3 refills | Status: DC

## 2019-06-18 NOTE — Progress Notes (Signed)
Depression screen [facility] ([place]) 2/9 06/18/2019 05/07/2019 12/14/2018 06/01/2018 01/21/2018  Decreased Interest 1 3 0 1 2  Down, Depressed, Hopeless 1 3 0 1 2  PHQ - 2 Score 2 6 0 2 4  Altered sleeping 1 1 - 1 0  Tired, decreased energy 0 3 - 1 2  Change in appetite 1 1 - 1 3  Feeling bad or failure about yourself  2 3 - 2 2  Trouble concentrating 0 3 - 1 2  Moving slowly or fidgety/restless 0 0 - 0 0  Suicidal thoughts 0 0 - 0 0  PHQ-9 Score 6 17 - 8 13  Difficult doing work/chores Somewhat difficult Somewhat difficult - - -  Some recent data might be hidden   GAD 7 : Generalized Anxiety Score 06/18/2019 05/07/2019  Nervous, Anxious, on Edge 1 1  Control/stop worrying 2 3  Worry too much - different things 1 1  Trouble relaxing 1 1  Restless 0 1  Easily annoyed or irritable 2 1  Afraid - awful might happen 0 3  Total GAD 7 Score 7 11  Anxiety Difficulty Somewhat difficult Somewhat difficult      Acute Office Visit  Subjective:    Patient ID: [name], female    DOB: 1950/06/13, 69 y.o.   MRN: [id number]  Chief Complaint  Patient presents with  . Depression    1 month     Depression        This is a chronic problem.  The current episode started 1 to 4 weeks ago.   The problem has been gradually improving since onset.  Associated symptoms include decreased concentration and decreased interest.  Associated symptoms include does not have insomnia.  Past treatments include SSRIs - Selective serotonin reuptake inhibitors and other medications.  Compliance with treatment is good. Diabetes She presents for her follow-up diabetic visit. She has type 2 diabetes mellitus. Her disease course has been improving. There are no hypoglycemic associated symptoms. There are no diabetic associated symptoms. Risk factors for coronary artery disease include diabetes mellitus, dyslipidemia, hypertension and post-menopausal. She is following a generally healthy diet. Meal planning includes avoidance of  concentrated sweets. An ACE inhibitor/angiotensin II receptor blocker is being taken.  Gastroesophageal Reflux She complains of heartburn. She reports no wheezing. This is a chronic problem. The current episode started more than 1 year ago. The problem occurs rarely. The symptoms are aggravated by certain foods. She has tried a PPI for the symptoms. The treatment provided significant relief.   Patient also reports chronic ringing in her years.  She noticed that after she retired from working.  She did work in a factory for many years.  She states that it is constant.  She is not have any associated dizziness.  She is under the care of neurology right now after having a TIA as seen at the hospital.  She did have a recent CT and MRI as planned.  Past Medical History:  Diagnosis Date  . Arthritis   . Depression    ONGOING PROBLEM  . Diabetes mellitus without complication ([place])    DX 2008   USES PILLS  . Dizziness and giddiness 06/29/2015  . GERD (gastroesophageal reflux disease)   . Hypertension   . Hypothyroidism   . Morbidly obese ([place])   . Morton's neuroma of left foot     Past Surgical History:  Procedure Laterality Date  . CARPAL TUNNEL RELEASE     LEFT  . CARPAL TUNNEL  RELEASE Right 12/22/2012   Procedure: CARPAL TUNNEL RELEASE- RIGHT;  Surgeon: Meredith Pel, MD;  Location: [place];  Service: Orthopedics;  Laterality: Right;  . SHOULDER ARTHROSCOPY W/ ROTATOR CUFF REPAIR     LEFT  . SHOULDER ARTHROSCOPY WITH ROTATOR CUFF REPAIR Right 12/22/2012   Procedure: SHOULDER ARTHROSCOPY WITH ROTATOR CUFF REPAIR AND BICEPS TENODESIS REPAIR;  Surgeon: Meredith Pel, MD;  Location: [place];  Service: Orthopedics;  Laterality: Right;  Right Carpal Tunnel Release, Right Shoulder Arthroscopy, Rotator Cuff Tear Repair, Biceps Tenodesis  . TOE SURGERY     LEFT BIG TOE  . TUBAL LIGATION      Family History  Problem Relation Age of Onset  . Heart attack Mother   . Cancer Father         brain  . Heart failure Father   . Cancer Brother        Prostate  . Leukemia Brother   . Dementia Sister        alzheimers  . Heart disease Sister   . Diabetes Sister   . Hypertension Sister   . Hyperlipidemia Sister   . Heart failure Sister     Social History   Socioeconomic History  . Marital status: Widowed    Spouse name: Not on file  . Number of children: 1  . Years of education: 38  . Highest education level: Not on file  Occupational History  . Occupation: disability  Tobacco Use  . Smoking status: Never Smoker  . Smokeless tobacco: Never Used  Substance and Sexual Activity  . Alcohol use: No  . Drug use: No  . Sexual activity: Not on file  Other Topics Concern  . Not on file  Social History Narrative   Patient drinks 1 soda daily.   Patient is left handed.    Social Determinants of Health   Financial Resource Strain:   . Difficulty of Paying Living Expenses: Not on file  Food Insecurity:   . Worried About Charity fundraiser in the Last Year: Not on file  . Ran Out of Food in the Last Year: Not on file  Transportation Needs:   . Lack of Transportation (Medical): Not on file  . Lack of Transportation (Non-Medical): Not on file  Physical Activity:   . Days of Exercise per Week: Not on file  . Minutes of Exercise per Session: Not on file  Stress:   . Feeling of Stress : Not on file  Social Connections:   . Frequency of Communication with Friends and Family: Not on file  . Frequency of Social Gatherings with Friends and Family: Not on file  . Attends Religious Services: Not on file  . Active Member of Clubs or Organizations: Not on file  . Attends Archivist Meetings: Not on file  . Marital Status: Not on file  Intimate Partner Violence:   . Fear of Current or Ex-Partner: Not on file  . Emotionally Abused: Not on file  . Physically Abused: Not on file  . Sexually Abused: Not on file    Outpatient Medications Prior to Visit  Medication  Sig Dispense Refill  . aspirin EC 81 MG tablet Take 81 mg by mouth daily.    . blood glucose meter kit and supplies KIT Dispense based on patient and insurance preference. Use up to four times daily as directed. (FOR ICD-9 250.00, 250.01). 1 each 11  . buPROPion (WELLBUTRIN XL) 150 MG 24 hr tablet Take 1 tablet (  150 mg total) by mouth daily. (Needs to be seen before next refill) 30 tablet 2  . escitalopram (LEXAPRO) 10 MG tablet Take 1 tablet (10 mg total) by mouth daily. 30 tablet 1  . glucose blood (ONETOUCH VERIO) test strip Use to check blood sugars up to 4 times daily 400 each 3  . levothyroxine (SYNTHROID) 100 MCG tablet TAKE 1 TABLET BY MOUTH ONCE DAILY BEFORE BREAKFAST 90 tablet 3  . lisinopril (ZESTRIL) 10 MG tablet Take 1 tablet (10 mg total) by mouth daily. 90 tablet 3  . metFORMIN (GLUCOPHAGE) 1000 MG tablet TAKE 1 TABLET BY MOUTH TWICE DAILY WITH A MEAL 180 tablet 0  . omeprazole (PRILOSEC) 20 MG capsule Take 1 capsule (20 mg total) by mouth daily. 90 capsule 3  . ONETOUCH DELICA LANCETS 51W MISC Use to check blood sugars up to 4 times daily 400 each 3   No facility-administered medications prior to visit.    Allergies  Allergen Reactions  . Penicillins Other (See Comments)    Reaction unknown  . Shrimp [Shellfish Allergy] Itching  . Sulfa Antibiotics Other (See Comments)    Reaction unknown    Review of Systems  Constitutional: Negative.   HENT: Positive for hearing loss and tinnitus. Negative for congestion and dental problem.   Eyes: Negative.   Respiratory: Negative.  Negative for shortness of breath, wheezing and stridor.   Cardiovascular: Negative.   Gastrointestinal: Positive for heartburn.  Genitourinary: Negative.   Psychiatric/Behavioral: Positive for decreased concentration and depression. The patient does not have insomnia.        Objective:    Physical Exam Constitutional:      General: She is not in acute distress.    Appearance: Normal appearance.  She is well-developed.  HENT:     Head: Normocephalic and atraumatic.  Cardiovascular:     Rate and Rhythm: Normal rate.  Pulmonary:     Effort: Pulmonary effort is normal.  Skin:    General: Skin is warm and dry.     Findings: No rash.  Neurological:     Mental Status: She is alert and oriented to person, place, and time.     Deep Tendon Reflexes: Reflexes are normal and symmetric.     BP 133/63   Pulse (!) 57   Temp (!) 96 F (35.6 C) (Temporal)   Ht _0  (1.473 m)   Wt 218 lb 9.6 oz (99.2 kg)   SpO2 100%   BMI 45.69 kg/m  Wt Readings from Last 3 Encounters:  06/18/19 218 lb 9.6 oz (99.2 kg)  05/19/19 212 lb (96.2 kg)  05/10/19 214 lb (97.1 kg)    Health Maintenance Due  Topic Date Due  . COLONOSCOPY  08/29/2011  . DEXA SCAN  05/28/2017    There are no preventive care reminders to display for this patient.   Lab Results  Component Value Date   TSH 0.686 05/07/2019   Lab Results  Component Value Date   WBC 7.8 05/10/2019   HGB 10.2 (L) 05/10/2019   HCT 34.1 (L) 05/10/2019   MCV 90.7 05/10/2019   PLT 250 05/10/2019   Lab Results  Component Value Date   NA 138 05/10/2019   K 4.1 05/10/2019   CO2 27 05/10/2019   GLUCOSE 291 (H) 05/10/2019   BUN 16 05/10/2019   CREATININE 0.83 05/10/2019   BILITOT 0.3 05/10/2019   ALKPHOS 50 05/10/2019   AST 28 05/10/2019   ALT 30 05/10/2019   PROT 6.8  05/10/2019   ALBUMIN 3.6 05/10/2019   CALCIUM 8.9 05/10/2019   ANIONGAP 8 05/10/2019   Lab Results  Component Value Date   CHOL 121 05/07/2019   Lab Results  Component Value Date   HDL 60 05/07/2019   Lab Results  Component Value Date   LDLCALC 42 05/07/2019   Lab Results  Component Value Date   TRIG 100 05/07/2019   Lab Results  Component Value Date   CHOLHDL 2.0 05/07/2019   Lab Results  Component Value Date   HGBA1C 8.5 (H) 05/07/2019       Assessment & Plan:     No orders of the defined types were placed in this  encounter.    Terald Sleeper, PA-C

## 2019-06-30 ENCOUNTER — Other Ambulatory Visit: Payer: Medicare HMO

## 2019-07-22 ENCOUNTER — Other Ambulatory Visit: Payer: Self-pay | Admitting: *Deleted

## 2019-07-22 DIAGNOSIS — E039 Hypothyroidism, unspecified: Secondary | ICD-10-CM

## 2019-07-22 MED ORDER — LEVOTHYROXINE SODIUM 100 MCG PO TABS: ORAL_TABLET | ORAL | 3 refills | Status: DC

## 2019-07-30 ENCOUNTER — Other Ambulatory Visit: Payer: Self-pay

## 2019-07-30 ENCOUNTER — Ambulatory Visit (INDEPENDENT_AMBULATORY_CARE_PROVIDER_SITE_OTHER): Payer: Medicare HMO | Admitting: Physician Assistant

## 2019-07-30 ENCOUNTER — Encounter: Payer: Self-pay | Admitting: Physician Assistant

## 2019-07-30 VITALS — BP 129/72 | HR 70 | Temp 97.8°F | Ht <= 58 in | Wt 216.2 lb

## 2019-07-30 DIAGNOSIS — R42 Dizziness and giddiness: Secondary | ICD-10-CM | POA: Diagnosis not present

## 2019-07-30 DIAGNOSIS — E119 Type 2 diabetes mellitus without complications: Secondary | ICD-10-CM | POA: Diagnosis not present

## 2019-07-30 DIAGNOSIS — E7800 Pure hypercholesterolemia, unspecified: Secondary | ICD-10-CM | POA: Diagnosis not present

## 2019-07-30 MED ORDER — MECLIZINE HCL 25 MG PO TABS: 25.0000 mg | ORAL_TABLET | Freq: Two times a day (BID) | ORAL | 0 refills | Status: DC | PRN

## 2019-08-05 NOTE — Progress Notes (Signed)
Subjective: [name] medication PCP: Terald Sleeper, PA-C RAQ:[id number] [name] is a 69 y.o. female    Depression        This is a chronic problem.  The problem occurs constantly.  The problem has been gradually worsening since onset.  Associated symptoms include fatigue.  Past treatments include SSRIs - Selective serotonin reuptake inhibitors. Gastroesophageal Reflux She complains of heartburn. She reports no chest pain or no coughing. This is a chronic problem. Associated symptoms include fatigue.  Diabetes She presents for her follow-up diabetic visit. Her disease course has been stable. Hypoglycemia symptoms include dizziness. Associated symptoms include fatigue. Pertinent negatives for diabetes include no chest pain. Risk factors for coronary artery disease include diabetes mellitus, dyslipidemia and hypertension. She is following a diabetic diet. An ACE inhibitor/angiotensin II receptor blocker is being taken.  Dizziness This is a chronic problem. The problem has been waxing and waning. Associated symptoms include fatigue. Pertinent negatives include no chest pain, chills, coughing or fever.    Patient is having a follow-up for her chronic medical conditions which do include depression, hyperlipidemia, hypertension, diabetes.  She is also had a flareup of her vertigo of her feelings.  She is a little bit early for her A1c.  We will wait for done in a few weeks.  She states that overall she is feeling very good.  Her blood pressure readings have been good.  Her weight has been stable.  She is trying to eat well and do well.  She has not had any other new complaints at this time.  Review of Systems  Constitutional: Positive for fatigue. Negative for chills and fever.  HENT: Negative.   Respiratory: Negative.  Negative for cough.   Cardiovascular: Negative.  Negative for chest pain and palpitations.  Gastrointestinal: Positive for heartburn.  Skin: Negative.   Neurological:  Positive for dizziness.  Psychiatric/Behavioral: Positive for depression.    ROS: Per HPI  Allergies  Allergen Reactions  . Penicillins Other (See Comments)    Reaction unknown  . Shrimp [Shellfish Allergy] Itching  . Sulfa Antibiotics Other (See Comments)    Reaction unknown   Past Medical History:  Diagnosis Date  . Arthritis   . Depression    ONGOING PROBLEM  . Diabetes mellitus without complication ([place])    DX 2008   USES PILLS  . Dizziness and giddiness 06/29/2015  . GERD (gastroesophageal reflux disease)   . Hypertension   . Hypothyroidism   . Morbidly obese ([place])   . Morton'Madison neuroma of left foot     Current Outpatient Medications:  .  aspirin EC 81 MG tablet, Take 81 mg by mouth daily., Disp: , Rfl:  .  blood glucose meter kit and supplies KIT, Dispense based on patient and insurance preference. Use up to four times daily as directed. (FOR ICD-9 250.00, 250.01)., Disp: 1 each, Rfl: 11 .  buPROPion (WELLBUTRIN XL) 150 MG 24 hr tablet, Take 1 tablet (150 mg total) by mouth daily., Disp: 90 tablet, Rfl: 3 .  escitalopram (LEXAPRO) 20 MG tablet, Take 1 tablet (20 mg total) by mouth daily., Disp: 90 tablet, Rfl: 1 .  gabapentin (NEURONTIN) 100 MG capsule, Take 1-3 capsules (100-300 mg total) by mouth at bedtime., Disp: 90 capsule, Rfl: 3 .  glucose blood (ONETOUCH VERIO) test strip, Use to check blood sugars up to 4 times daily, Disp: 400 each, Rfl: 3 .  levothyroxine (SYNTHROID) 100 MCG tablet, TAKE 1 TABLET BY MOUTH ONCE  DAILY BEFORE BREAKFAST, Disp: 90 tablet, Rfl: 3 .  lisinopril (ZESTRIL) 10 MG tablet, Take 1 tablet (10 mg total) by mouth daily., Disp: 90 tablet, Rfl: 3 .  metFORMIN (GLUCOPHAGE) 1000 MG tablet, Take 1 tablet (1,000 mg total) by mouth 2 (two) times daily with a meal., Disp: 180 tablet, Rfl: 3 .  omeprazole (PRILOSEC) 20 MG capsule, Take 1 capsule (20 mg total) by mouth daily., Disp: 90 capsule, Rfl: 3 .  ONETOUCH DELICA LANCETS 91R MISC, Use to check  blood sugars up to 4 times daily, Disp: 400 each, Rfl: 3 .  atorvastatin (LIPITOR) 10 MG tablet, Take 10 mg by mouth daily., Disp: , Rfl:  .  meclizine (ANTIVERT) 25 MG tablet, Take 1 tablet (25 mg total) by mouth 2 (two) times daily as needed for dizziness., Disp: 30 tablet, Rfl: 0  BP 129/72   Pulse 70   Temp 97.8 F (36.6 C)   Ht '4\' 10"'  (1.473 m)   Wt 216 lb 3.2 oz (98.1 kg)   SpO2 98%   BMI 45.19 kg/m   Physical Exam  Constitutional: She appears well-developed and well-nourished. No distress.  Cardiovascular: Normal rate and regular rhythm.  Pulmonary/Chest: Effort normal and breath sounds normal.  Vitals reviewed.   Assessment/ Plan: 69 y.o. female   1. Type 2 diabetes mellitus without complication, without long-term current use of insulin (HCC) - Bayer DCA Hb A1c Waived; Future  2. Elevated cholesterol - atorvastatin (LIPITOR) 10 MG tablet; Take 10 mg by mouth daily.  3. Dizziness and giddiness - meclizine (ANTIVERT) 25 MG tablet; Take 1 tablet (25 mg total) by mouth 2 (two) times daily as needed for dizziness.  Dispense: 30 tablet; Refill: 0   Return in about 3 months (around 10/30/2019).  Continue all other maintenance medications as listed above.     Meds ordered this encounter  Medications  . meclizine (ANTIVERT) 25 MG tablet    Sig: Take 1 tablet (25 mg total) by mouth 2 (two) times daily as needed for dizziness.    Dispense:  30 tablet    Refill:  0    Order Specific Question:   Supervising Provider    Answer:   Janora Norlander [[id number]]    Particia Nearing PA-C [place] [phone]

## 2019-09-13 ENCOUNTER — Encounter: Payer: Self-pay | Admitting: *Deleted

## 2019-10-01 ENCOUNTER — Ambulatory Visit: Payer: Medicare HMO | Admitting: Family Medicine

## 2019-11-01 ENCOUNTER — Ambulatory Visit: Payer: Medicare HMO | Admitting: Physician Assistant

## 2019-11-01 ENCOUNTER — Encounter: Payer: Self-pay | Admitting: Family

## 2019-11-01 ENCOUNTER — Ambulatory Visit (INDEPENDENT_AMBULATORY_CARE_PROVIDER_SITE_OTHER): Payer: Medicare HMO | Admitting: Family

## 2019-11-01 ENCOUNTER — Other Ambulatory Visit: Payer: Self-pay

## 2019-11-01 VITALS — BP 139/72 | HR 66 | Temp 97.9°F | Ht <= 58 in | Wt 220.0 lb

## 2019-11-01 DIAGNOSIS — E1169 Type 2 diabetes mellitus with other specified complication: Secondary | ICD-10-CM | POA: Diagnosis not present

## 2019-11-01 DIAGNOSIS — E785 Hyperlipidemia, unspecified: Secondary | ICD-10-CM

## 2019-11-01 DIAGNOSIS — E119 Type 2 diabetes mellitus without complications: Secondary | ICD-10-CM | POA: Diagnosis not present

## 2019-11-01 DIAGNOSIS — Z1159 Encounter for screening for other viral diseases: Secondary | ICD-10-CM | POA: Diagnosis not present

## 2019-11-01 DIAGNOSIS — Z1211 Encounter for screening for malignant neoplasm of colon: Secondary | ICD-10-CM | POA: Diagnosis not present

## 2019-11-01 DIAGNOSIS — E039 Hypothyroidism, unspecified: Secondary | ICD-10-CM

## 2019-11-01 DIAGNOSIS — M8949 Other hypertrophic osteoarthropathy, multiple sites: Secondary | ICD-10-CM

## 2019-11-01 DIAGNOSIS — I10 Essential (primary) hypertension: Secondary | ICD-10-CM | POA: Diagnosis not present

## 2019-11-01 DIAGNOSIS — R42 Dizziness and giddiness: Secondary | ICD-10-CM | POA: Diagnosis not present

## 2019-11-01 DIAGNOSIS — K219 Gastro-esophageal reflux disease without esophagitis: Secondary | ICD-10-CM | POA: Diagnosis not present

## 2019-11-01 DIAGNOSIS — Z780 Asymptomatic menopausal state: Secondary | ICD-10-CM | POA: Diagnosis not present

## 2019-11-01 DIAGNOSIS — F3341 Major depressive disorder, recurrent, in partial remission: Secondary | ICD-10-CM | POA: Diagnosis not present

## 2019-11-01 DIAGNOSIS — M159 Polyosteoarthritis, unspecified: Secondary | ICD-10-CM

## 2019-11-01 DIAGNOSIS — M150 Primary generalized (osteo)arthritis: Secondary | ICD-10-CM

## 2019-11-01 DIAGNOSIS — R69 Illness, unspecified: Secondary | ICD-10-CM | POA: Diagnosis not present

## 2019-11-01 LAB — BAYER DCA HB A1C WAIVED: HB A1C (BAYER DCA - WAIVED): 9.7 % — ABNORMAL HIGH (ref <7.0)

## 2019-11-01 MED ORDER — LISINOPRIL 10 MG PO TABS: 10.0000 mg | ORAL_TABLET | Freq: Every day | ORAL | 1 refills | Status: DC

## 2019-11-01 MED ORDER — OZEMPIC (0.25 OR 0.5 MG/DOSE) 2 MG/1.5ML ~~LOC~~ SOPN: PEN_INJECTOR | SUBCUTANEOUS | 1 refills | Status: AC

## 2019-11-01 MED ORDER — OMEPRAZOLE 20 MG PO CPDR: 20.0000 mg | DELAYED_RELEASE_CAPSULE | Freq: Every day | ORAL | 1 refills | Status: DC

## 2019-11-01 MED ORDER — ESCITALOPRAM OXALATE 20 MG PO TABS: 20.0000 mg | ORAL_TABLET | Freq: Every day | ORAL | 1 refills | Status: DC

## 2019-11-01 MED ORDER — BUPROPION HCL ER (XL) 150 MG PO TB24: 150.0000 mg | ORAL_TABLET | Freq: Every day | ORAL | 3 refills | Status: DC

## 2019-11-01 NOTE — Patient Instructions (Signed)

## 2019-11-01 NOTE — Progress Notes (Signed)
Subjective:    Patient ID: [name], female    DOB: [birth year], 69 y.o.   MRN: [id number]  Chief Complaint  Patient presents with   [place] pt    Diabetes    3 month follow up of chronic medical conditions   Pt presents to the office today to establish care with me.  Diabetes She presents for her follow-up diabetic visit. She has type 2 diabetes mellitus. Her disease course has been stable. There are no hypoglycemic associated symptoms. Associated symptoms include blurred vision. Pertinent negatives for diabetes include no fatigue and no foot paresthesias. Symptoms are stable. Diabetic complications include peripheral neuropathy. Pertinent negatives for diabetic complications include no CVA or heart disease. Risk factors for coronary artery disease include dyslipidemia, diabetes mellitus, hypertension, sedentary lifestyle and post-menopausal. She is following a generally unhealthy diet. Her overall blood glucose range is 180-200 mg/dl. An ACE inhibitor/angiotensin II receptor blocker is being taken. Eye exam is current.  Hypertension This is a chronic problem. The current episode started more than 1 year ago. The problem has been waxing and waning since onset. The problem is uncontrolled. Associated symptoms include blurred vision. Pertinent negatives include no malaise/fatigue, peripheral edema or shortness of breath. Risk factors for coronary artery disease include dyslipidemia, diabetes mellitus, obesity, sedentary lifestyle and family history. Past treatments include ACE inhibitors. The current treatment provides mild improvement. There is no history of kidney disease, CAD/MI or CVA. Identifiable causes of hypertension include a thyroid problem.  Gastroesophageal Reflux She complains of belching and heartburn. This is a chronic problem. The current episode started more than 1 year ago. The problem occurs occasionally. The problem has been waxing and waning. The  symptoms are aggravated by certain foods. Pertinent negatives include no fatigue. Risk factors include obesity. She has tried a PPI for the symptoms. The treatment provided moderate relief.  Thyroid Problem Presents for follow-up visit. Patient reports no constipation, depressed mood or fatigue. The symptoms have been stable. Her past medical history is significant for hyperlipidemia.  Hyperlipidemia This is a chronic problem. The current episode started more than 1 year ago. The problem is controlled. Recent lipid tests were reviewed and are normal. Exacerbating diseases include obesity. Pertinent negatives include no shortness of breath. Current antihyperlipidemic treatment includes statins. The current treatment provides moderate improvement of lipids. Risk factors for coronary artery disease include dyslipidemia, diabetes mellitus, hypertension, a sedentary lifestyle and post-menopausal.  Arthritis Presents for follow-up visit. She complains of pain and stiffness. The symptoms have been stable. Affected locations include the right MCP, left MCP, left shoulder and right shoulder. Her pain is at a severity of 4/10. Pertinent negatives include no fatigue.  Depression        This is a chronic problem.  The current episode started more than 1 year ago.   The onset quality is gradual.   The problem occurs intermittently.  The problem has been waxing and waning since onset.  Associated symptoms include helplessness, hopelessness, irritable, restlessness and sad.  Associated symptoms include no fatigue.  Past treatments include SNRIs - Serotonin and norepinephrine reuptake inhibitors.  Compliance with treatment is good.  Past medical history includes thyroid problem.       Review of Systems  Constitutional: Negative for fatigue and malaise/fatigue.  Eyes: Positive for blurred vision.  Respiratory: Negative for shortness of breath.   Gastrointestinal: Positive for heartburn. Negative for constipation.    Musculoskeletal: Positive for arthritis and stiffness.  Psychiatric/Behavioral:  Positive for depression.  All other systems reviewed and are negative.      Objective:   Physical Exam Vitals reviewed.  Constitutional:      General: She is irritable. She is not in acute distress.    Appearance: She is well-developed. She is obese.  HENT:     Head: Normocephalic and atraumatic.     Right Ear: Tympanic membrane normal.     Left Ear: Tympanic membrane normal.  Eyes:     Pupils: Pupils are equal, round, and reactive to light.  Neck:     Thyroid: No thyromegaly.  Cardiovascular:     Rate and Rhythm: Normal rate and regular rhythm.     Heart sounds: Normal heart sounds. No murmur.  Pulmonary:     Effort: Pulmonary effort is normal. No respiratory distress.     Breath sounds: Normal breath sounds. No wheezing.  Abdominal:     General: Bowel sounds are normal. There is no distension.     Palpations: Abdomen is soft.     Tenderness: There is no abdominal tenderness.  Musculoskeletal:        General: No tenderness. Normal range of motion.     Cervical back: Normal range of motion and neck supple.  Skin:    General: Skin is warm and dry.  Neurological:     Mental Status: She is alert and oriented to person, place, and time.     Cranial Nerves: No cranial nerve deficit.     Deep Tendon Reflexes: Reflexes are normal and symmetric.  Psychiatric:        Behavior: Behavior normal.        Thought Content: Thought content normal.        Judgment: Judgment normal.       BP (!) 160/97    Pulse 67    Temp 97.9 F (36.6 C) (Temporal)    Ht _0  (1.473 m)    Wt 220 lb (99.8 kg)    SpO2 97%    BMI 45.98 kg/m      Assessment & Plan:  [name] comes in today with chief complaint of Establish Care ([place] pt ) and Diabetes (3 month follow up of chronic medical conditions)   Diagnosis and orders addressed:  1. Type 2 diabetes mellitus without complication, without long-term  current use of insulin (HCC) Will add Ozempic  Strict low carb diet - Bayer DCA Hb A1c Waived - CMP14+EGFR - CBC with Differential/Platelet - Semaglutide,0.25 or 0.5MG/DOS, (OZEMPIC, 0.25 OR 0.5 MG/DOSE,) 2 MG/1.5ML SOPN; Inject 0.1875 mLs (0.25 mg total) into the skin once a week for 28 days, THEN 0.375 mLs (0.5 mg total) once a week for 28 days.  Dispense: 12 pen; Refill: 1  2. Recurrent major depressive disorder, in partial remission (HCC) - escitalopram (LEXAPRO) 20 MG tablet; Take 1 tablet (20 mg total) by mouth daily.  Dispense: 90 tablet; Refill: 1 - CMP14+EGFR - CBC with Differential/Platelet  3. Essential hypertension - lisinopril (ZESTRIL) 10 MG tablet; Take 1 tablet (10 mg total) by mouth daily.  Dispense: 90 tablet; Refill: 1 - CMP14+EGFR - CBC with Differential/Platelet  4. Gastroesophageal reflux disease without esophagitis - omeprazole (PRILOSEC) 20 MG capsule; Take 1 capsule (20 mg total) by mouth daily.  Dispense: 90 capsule; Refill: 1 - CMP14+EGFR - CBC with Differential/Platelet  5. Hypothyroidism, unspecified type - CMP14+EGFR - CBC with Differential/Platelet - TSH   7. Primary osteoarthritis involving multiple joints - CMP14+EGFR - CBC with Differential/Platelet  8. Hyperlipidemia associated with type 2 diabetes mellitus ([place]) - CMP14+EGFR - CBC with Differential/Platelet  9. Colon cancer screening - Ambulatory referral to Gastroenterology - CMP14+EGFR - CBC with Differential/Platelet  10. Need for hepatitis C screening test - CMP14+EGFR - CBC with Differential/Platelet - Hepatitis C antibody  11. Post-menopausal - CMP14+EGFR - CBC with Differential/Platelet - DG [facility] DEXA   Labs pending Health Maintenance reviewed Diet and exercise encouraged  Follow up plan: 2 months, and will make appt with Clinical Pharm for diabetic education    Evelina Dun, FNP

## 2019-11-02 ENCOUNTER — Encounter: Payer: Self-pay | Admitting: Internal Medicine

## 2019-11-02 ENCOUNTER — Other Ambulatory Visit: Payer: Self-pay | Admitting: Family

## 2019-11-02 LAB — CBC WITH DIFFERENTIAL/PLATELET
Basophils Absolute: 0.1 10*3/uL (ref 0.0–0.2)
Basos: 1 %
EOS (ABSOLUTE): 0.3 10*3/uL (ref 0.0–0.4)
Eos: 3 %
Hematocrit: 34.7 % (ref 34.0–46.6)
Hemoglobin: 11.1 g/dL (ref 11.1–15.9)
Immature Grans (Abs): 0.1 10*3/uL (ref 0.0–0.1)
Immature Granulocytes: 1 %
Lymphocytes Absolute: 3.2 10*3/uL — ABNORMAL HIGH (ref 0.7–3.1)
Lymphs: 38 %
MCH: 27.5 pg (ref 26.6–33.0)
MCHC: 32 g/dL (ref 31.5–35.7)
MCV: 86 fL (ref 79–97)
Monocytes Absolute: 0.6 10*3/uL (ref 0.1–0.9)
Monocytes: 7 %
Neutrophils Absolute: 4.2 10*3/uL (ref 1.4–7.0)
Neutrophils: 50 %
Platelets: 256 10*3/uL (ref 150–450)
RBC: 4.04 x10E6/uL (ref 3.77–5.28)
RDW: 16 % — ABNORMAL HIGH (ref 11.7–15.4)
WBC: 8.3 10*3/uL (ref 3.4–10.8)

## 2019-11-02 LAB — CMP14+EGFR
ALT: 21 IU/L (ref 0–32)
AST: 24 IU/L (ref 0–40)
Albumin/Globulin Ratio: 1.7 (ref 1.2–2.2)
Albumin: 4 g/dL (ref 3.8–4.8)
Alkaline Phosphatase: 62 IU/L (ref 48–121)
BUN/Creatinine Ratio: 20 (ref 12–28)
BUN: 17 mg/dL (ref 8–27)
Bilirubin Total: 0.2 mg/dL (ref 0.0–1.2)
CO2: 21 mmol/L (ref 20–29)
Calcium: 9.1 mg/dL (ref 8.7–10.3)
Chloride: 102 mmol/L (ref 96–106)
Creatinine, Ser: 0.84 mg/dL (ref 0.57–1.00)
GFR calc Af Amer: 83 mL/min/{1.73_m2} (ref >59)
GFR calc non Af Amer: 72 mL/min/{1.73_m2} (ref >59)
Globulin, Total: 2.4 g/dL (ref 1.5–4.5)
Glucose: 232 mg/dL — ABNORMAL HIGH (ref 65–99)
Potassium: 4.3 mmol/L (ref 3.5–5.2)
Sodium: 137 mmol/L (ref 134–144)
Total Protein: 6.4 g/dL (ref 6.0–8.5)

## 2019-11-02 LAB — HEPATITIS C ANTIBODY: Hep C Virus Ab: <0.1 s/co ratio (ref 0.0–0.9)

## 2019-11-02 LAB — TSH: TSH: 1.68 u[IU]/mL (ref 0.450–4.500)

## 2019-11-11 ENCOUNTER — Other Ambulatory Visit: Payer: Self-pay

## 2019-11-11 ENCOUNTER — Ambulatory Visit (INDEPENDENT_AMBULATORY_CARE_PROVIDER_SITE_OTHER): Payer: Medicare HMO | Admitting: Pharmacist

## 2019-11-11 DIAGNOSIS — E119 Type 2 diabetes mellitus without complications: Secondary | ICD-10-CM

## 2019-11-11 NOTE — Progress Notes (Signed)
    11/11/2019 Name: [name] MRN: [id number] DOB: 1951-01-10   S:  69 yoF presents for diabetes evaluation, education, and management Patient was referred and last seen by Primary Care Provider on 11/01/19.  Insurance coverage/medication affordability: aetna medicare  Patient reports adherence with medications. . Current diabetes medications include: metformin, ozempic (hasn't started) . Current hypertension medications include: lisinopril Goal 130/80 . Current hyperlipidemia medications include: atorvastatin   Patient denies hypoglycemic events.   Patient reported dietary habits: Eats 2-3 meals/day  Extensive dietary counseling provided  Discussed meal planning options and Plate method for healthy eating  Avoid sugary drinks and desserts  Incorporate balanced protein, non starchy veggies, 1 serving of carbohydrate  Increase water intake  Increase physical activity as able.  Patient-reported exercise habits: n/a  O:  Lab Results  Component Value Date   HGBA1C 9.7 (H) 11/01/2019     Lipid Panel     Component Value Date/Time   CHOL 121 05/07/2019 1210   TRIG 100 05/07/2019 1210   HDL 60 05/07/2019 1210   CHOLHDL 2.0 05/07/2019 1210   LDLCALC 42 05/07/2019 1210    Home fasting blood sugars: n/a  2 hour post-meal/random blood sugars: n/a.   A/P:  Diabetes T2DM currently UNCONTROLLED.  Patient is adherent with medication. She has not yet started Ozempic.  Control is suboptimal due to dietary/medication optimization.  -Started GLP-1 OZEMPIC (generic name SEMAGLUTIDE)  . Instructions: Inject 0.25 mg into the skin weekly for 4 weeks, then increase to 0.5mg  weekly thereafter (as tolerated) . Demonstration & education provided  -Continue metformin  -Extensively discussed pathophysiology of diabetes, recommended lifestyle interventions, dietary effects on blood sugar control  -Counseled on s/sx of and management of hypoglycemia  -Next A1C anticipated 3-6  months    Written patient instructions provided.  Total time in face to face counseling 30 minutes.   Follow up PCP Clinic Visit ON 01/03/20.    Regina Eck, PharmD, BCPS Clinical Pharmacist, [place]  [facility] Phone [phone]

## 2019-11-23 ENCOUNTER — Encounter: Payer: Self-pay | Admitting: Pharmacist

## 2019-11-23 ENCOUNTER — Telehealth: Payer: Self-pay | Admitting: Pharmacist

## 2019-11-23 NOTE — Telephone Encounter (Signed)
Patient started Ozempic 2 weeks ago She denies any adverse events She states she is feeling better overall Encouraged patient to continue taking medications as prescribed and to call if needs arise PCP f/u appt in 12/2019

## 2019-12-22 ENCOUNTER — Encounter: Payer: Self-pay | Admitting: Gastroenterology

## 2019-12-22 ENCOUNTER — Encounter: Payer: Self-pay | Admitting: *Deleted

## 2019-12-22 ENCOUNTER — Other Ambulatory Visit: Payer: Self-pay

## 2019-12-22 ENCOUNTER — Ambulatory Visit: Payer: Medicare HMO | Admitting: Gastroenterology

## 2019-12-22 DIAGNOSIS — Z1211 Encounter for screening for malignant neoplasm of colon: Secondary | ICD-10-CM | POA: Diagnosis not present

## 2019-12-22 DIAGNOSIS — K219 Gastro-esophageal reflux disease without esophagitis: Secondary | ICD-10-CM

## 2019-12-22 MED ORDER — PANTOPRAZOLE SODIUM 40 MG PO TBEC: 40.0000 mg | DELAYED_RELEASE_TABLET | Freq: Every day | ORAL | 3 refills | Status: DC

## 2019-12-22 NOTE — Progress Notes (Signed)
Primary Care Physician:  Sharion Balloon, FNP  Primary Gastroenterologist:  Elon Alas. Abbey Chatters, DO   Chief Complaint  Patient presents with  . Colonoscopy    last tcs 2009 or 2010 in [place]; history of polyps and internal hemorrhoids    HPI:  [name] is a 69 y.o. female here at the request of Evelina Dun, FNP for screening colonoscopy.   BM regular. No melena, brbpr. On omeprazole for heartburn for couple of years. Controls typical symptoms but will have regurgitation several times per week. No early satiety. No abdominal pain. Occasional solid food dysphagia about twice per month. Postprandial belching with regurgitation.   No prior upper endoscopy.  She reports her last colonoscopy was about 10 years ago.  She reports poor sedation.  She believes she had polyps and internal hemorrhoids.  She is not sure when she was supposed to have another colonoscopy.     Current Outpatient Medications  Medication Sig Dispense Refill  . aspirin EC 81 MG tablet Take 81 mg by mouth daily.    Marland Kitchen atorvastatin (LIPITOR) 10 MG tablet Take 10 mg by mouth daily.    . blood glucose meter kit and supplies KIT Dispense based on patient and insurance preference. Use up to four times daily as directed. (FOR ICD-9 250.00, 250.01). 1 each 11  . buPROPion (WELLBUTRIN XL) 150 MG 24 hr tablet Take 1 tablet (150 mg total) by mouth daily. 90 tablet 3  . escitalopram (LEXAPRO) 20 MG tablet Take 1 tablet (20 mg total) by mouth daily. 90 tablet 1  . gabapentin (NEURONTIN) 100 MG capsule Take 1-3 capsules (100-300 mg total) by mouth at bedtime. 90 capsule 3  . glucose blood (ONETOUCH VERIO) test strip Use to check blood sugars up to 4 times daily 400 each 3  . levothyroxine (SYNTHROID) 100 MCG tablet TAKE 1 TABLET BY MOUTH ONCE DAILY BEFORE BREAKFAST 90 tablet 3  . lisinopril (ZESTRIL) 10 MG tablet Take 1 tablet (10 mg total) by mouth daily. 90 tablet 1  . metFORMIN (GLUCOPHAGE) 1000 MG tablet Take 1 tablet (1,000  mg total) by mouth 2 (two) times daily with a meal. 180 tablet 3  . omeprazole (PRILOSEC) 20 MG capsule Take 1 capsule (20 mg total) by mouth daily. 90 capsule 1  . ONETOUCH DELICA LANCETS 32D MISC Use to check blood sugars up to 4 times daily 400 each 3  . Semaglutide,0.25 or 0.'5MG'$ /DOS, (OZEMPIC, 0.25 OR 0.5 MG/DOSE,) 2 MG/1.5ML SOPN Inject 0.1875 mLs (0.25 mg total) into the skin once a week for 28 days, THEN 0.375 mLs (0.5 mg total) once a week for 28 days. 12 pen 1   No current facility-administered medications for this visit.    Allergies as of 12/22/2019 - Review Complete 12/22/2019  Allergen Reaction Noted  . Penicillins Other (See Comments) 12/08/2012  . Shrimp [shellfish allergy] Itching 12/08/2012  . Sulfa antibiotics Other (See Comments) 12/08/2012    Past Medical History:  Diagnosis Date  . Arthritis   . Depression    ONGOING PROBLEM  . Diabetes mellitus without complication ([place])    DX 2008   USES PILLS  . Dizziness and giddiness 06/29/2015  . GERD (gastroesophageal reflux disease)   . Hypertension   . Hypothyroidism   . Morbidly obese ([place])   . Morton's neuroma of left foot     Past Surgical History:  Procedure Laterality Date  . CARPAL TUNNEL RELEASE     LEFT  . CARPAL TUNNEL RELEASE Right  12/22/2012   Procedure: CARPAL TUNNEL RELEASE- RIGHT;  Surgeon: Meredith Pel, MD;  Location: [facility];  Service: Orthopedics;  Laterality: Right;  . SHOULDER ARTHROSCOPY W/ ROTATOR CUFF REPAIR     LEFT  . SHOULDER ARTHROSCOPY WITH ROTATOR CUFF REPAIR Right 12/22/2012   Procedure: SHOULDER ARTHROSCOPY WITH ROTATOR CUFF REPAIR AND BICEPS TENODESIS REPAIR;  Surgeon: Meredith Pel, MD;  Location: [facility];  Service: Orthopedics;  Laterality: Right;  Right Carpal Tunnel Release, Right Shoulder Arthroscopy, Rotator Cuff Tear Repair, Biceps Tenodesis  . TOE SURGERY     LEFT BIG TOE  . TUBAL LIGATION      Family History  Problem Relation Age of Onset  . Heart attack Mother    . Cancer Father        brain  . Heart failure Father   . Cancer Brother        Prostate  . Leukemia Brother   . Dementia Sister        alzheimers  . Heart disease Sister   . Diabetes Sister   . Hypertension Sister   . Hyperlipidemia Sister   . Heart failure Sister   . Colon cancer Cousin        50    Social History   Socioeconomic History  . Marital status: Widowed    Spouse name: Not on file  . Number of children: 1  . Years of education: 65  . Highest education level: Not on file  Occupational History  . Occupation: disability  Tobacco Use  . Smoking status: Never Smoker  . Smokeless tobacco: Never Used  Vaping Use  . Vaping Use: Never used  Substance and Sexual Activity  . Alcohol use: No  . Drug use: No  . Sexual activity: Not on file  Other Topics Concern  . Not on file  Social History Narrative   Patient drinks 1 soda daily.   Patient is left handed.    Social Determinants of Health   Financial Resource Strain:   . Difficulty of Paying Living Expenses:   Food Insecurity:   . Worried About Charity fundraiser in the Last Year:   . Arboriculturist in the Last Year:   Transportation Needs:   . Film/video editor (Medical):   Marland Kitchen Lack of Transportation (Non-Medical):   Physical Activity:   . Days of Exercise per Week:   . Minutes of Exercise per Session:   Stress:   . Feeling of Stress :   Social Connections:   . Frequency of Communication with Friends and Family:   . Frequency of Social Gatherings with Friends and Family:   . Attends Religious Services:   . Active Member of Clubs or Organizations:   . Attends Archivist Meetings:   Marland Kitchen Marital Status:   Intimate Partner Violence:   . Fear of Current or Ex-Partner:   . Emotionally Abused:   Marland Kitchen Physically Abused:   . Sexually Abused:       ROS:  General: Negative for anorexia, weight loss, fever, chills, fatigue, weakness. Eyes: Negative for vision changes.  ENT: Negative for  hoarseness, difficulty swallowing , nasal congestion. CV: Negative for chest pain, angina, palpitations, dyspnea on exertion, peripheral edema.  Respiratory: Negative for dyspnea at rest, dyspnea on exertion, cough, sputum, wheezing.  GI: See history of present illness. GU:  Negative for dysuria, hematuria, urinary incontinence, urinary frequency, nocturnal urination.  MS: Negative for joint pain, low back pain.  Derm: Negative  for rash or itching.  Neuro: Negative for weakness, abnormal sensation, seizure, frequent headaches, memory loss, confusion.  Psych: Negative for anxiety, depression, suicidal ideation, hallucinations.  Endo: Negative for unusual weight change.  Heme: Negative for bruising or bleeding. Allergy: Negative for rash or hives.    Physical Examination:  BP 122/72   Pulse 93   Temp (!) 97.1 F (36.2 C) (Temporal)   Ht _0  (1.473 m)   Wt (!) 210 lb 3.2 oz (95.3 kg)   BMI 43.93 kg/m    General: Well-nourished, well-developed in no acute distress.  Head: Normocephalic, atraumatic.   Eyes: Conjunctiva pink, no icterus. Mouth: masked. Neck: Supple without thyromegaly, masses, or lymphadenopathy.  Lungs: Clear to auscultation bilaterally.  Heart: Regular rate and rhythm, no murmurs rubs or gallops.  Abdomen: Bowel sounds are normal, nontender, nondistended, no hepatosplenomegaly or masses, no abdominal bruits or    hernia , no rebound or guarding.   Rectal: not performed Extremities: No lower extremity edema. No clubbing or deformities.  Neuro: Alert and oriented x 4 , grossly normal neurologically.  Skin: Warm and dry, no rash or jaundice.   Psych: Alert and cooperative, normal mood and affect.  Labs: Lab Results  Component Value Date   CREATININE 0.84 11/01/2019   BUN 17 11/01/2019   NA 137 11/01/2019   K 4.3 11/01/2019   CL 102 11/01/2019   CO2 21 11/01/2019   Lab Results  Component Value Date   ALT 21 11/01/2019   AST 24 11/01/2019   ALKPHOS 62  11/01/2019   BILITOT 0.2 11/01/2019   Lab Results  Component Value Date   WBC 8.3 11/01/2019   HGB 11.1 11/01/2019   HCT 34.7 11/01/2019   MCV 86 11/01/2019   PLT 256 11/01/2019   Lab Results  Component Value Date   TSH 1.680 11/01/2019     Imaging Studies: No results found.  Impression/plan:  Pleasant 69 year old female presenting to schedule screening colonoscopy.  Last colonoscopy over 10 years ago.  She is unsure of the findings.  She believes she had polyps.  She recalls having poor sedation.  She was awake during the entire procedure and notes it was painful.  Denies any bowel concerns.  She had a cousin with colon cancer in her 34s.  Plan for deep sedation. ASA III.  I have discussed the risks, alternatives, benefits with regards to but not limited to the risk of reaction to medication, bleeding, infection, perforation and the patient is agreeable to proceed. Written consent to be obtained.  GERD for several years.  Typical heartburn well controlled.  Complains of intermittent regurgitation, some vague dysphagia concerns.  Discussed possibility of upper endoscopy but she is not interested at this time.  Discussed possibility of barium esophagram as well.  Patient declined.  We will switch medication from omeprazole to pantoprazole 40 mg daily before breakfast.  We will bring her back for follow-up in 3 months.  Consider further work-up as ongoing symptoms.

## 2019-12-22 NOTE — Progress Notes (Signed)
CC'ED TO PCP 

## 2019-12-22 NOTE — Patient Instructions (Signed)
1. Stop omeprazole. Start pantoprazole 40mg  daily before breakfast for reflux. 2. Colonoscopy as scheduled. See separate instructions.  3. Return to office in 3 months for follow up of reflux, swallowing issues. If ongoing problems even after change in medication, would consider further work up.

## 2019-12-31 DIAGNOSIS — H5200 Hypermetropia, unspecified eye: Secondary | ICD-10-CM | POA: Diagnosis not present

## 2019-12-31 DIAGNOSIS — H04123 Dry eye syndrome of bilateral lacrimal glands: Secondary | ICD-10-CM | POA: Diagnosis not present

## 2019-12-31 DIAGNOSIS — H25813 Combined forms of age-related cataract, bilateral: Secondary | ICD-10-CM | POA: Diagnosis not present

## 2019-12-31 DIAGNOSIS — E119 Type 2 diabetes mellitus without complications: Secondary | ICD-10-CM | POA: Diagnosis not present

## 2019-12-31 LAB — HM DIABETES EYE EXAM

## 2020-01-03 ENCOUNTER — Ambulatory Visit (INDEPENDENT_AMBULATORY_CARE_PROVIDER_SITE_OTHER): Payer: Medicare HMO | Admitting: Family

## 2020-01-03 ENCOUNTER — Encounter: Payer: Self-pay | Admitting: Family

## 2020-01-03 DIAGNOSIS — E1165 Type 2 diabetes mellitus with hyperglycemia: Secondary | ICD-10-CM | POA: Diagnosis not present

## 2020-01-03 MED ORDER — OZEMPIC (0.25 OR 0.5 MG/DOSE) 2 MG/1.5ML ~~LOC~~ SOPN: 0.5000 mg | PEN_INJECTOR | SUBCUTANEOUS | 3 refills | Status: DC

## 2020-01-03 NOTE — Progress Notes (Signed)
   Virtual Visit via telephone Note Due to COVID-19 pandemic this visit was conducted virtually. This visit type was conducted due to national recommendations for restrictions regarding the COVID-19 Pandemic (e.g. social distancing, sheltering in place) in an effort to limit this patient's exposure and mitigate transmission in our community. All issues noted in this document were discussed and addressed.  A physical exam was not performed with this format.  I connected with [name] on 01/03/20 at 9:00 AM  by telephone and verified that I am speaking with the correct person using two identifiers. [name] is currently located at home and no one is currently with her during visit. The provider, Evelina Dun, FNP is located in their office at time of visit.  I discussed the limitations, risks, security and privacy concerns of performing an evaluation and management service by telephone and the availability of in person appointments. I also discussed with the patient that there may be a patient responsible charge related to this service. The patient expressed understanding and agreed to proceed.   History and Present Illness:  Pt calls the office today to follow up on uncontrolled DM. She was seen on 11/01/19 to establish care with me and found to have an A1C of 9.7. We started her on Ozempic. She reports she is doing well and has lost 9lbs, but has not weighed in the last several weeks.   Diabetes She presents for her follow-up diabetic visit. She has type 2 diabetes mellitus. There are no hypoglycemic associated symptoms. Pertinent negatives for diabetes include no blurred vision and no foot paresthesias. There are no hypoglycemic complications. Symptoms are stable. Pertinent negatives for diabetic complications include no heart disease. Risk factors for coronary artery disease include dyslipidemia, diabetes mellitus, sedentary lifestyle and post-menopausal. She is following a generally  healthy diet. Her overall blood glucose range is 110-130 mg/dl. An ACE inhibitor/angiotensin II receptor blocker is being taken.      Review of Systems  Eyes: Negative for blurred vision.     Observations/Objective: No SOB or distress noted   Assessment and Plan: 1. Type 2 diabetes mellitus with hyperglycemia, without long-term current use of insulin (HCC) Continue Ozempic and metformin Continue Lisinopril 10 mg and Lipitor daily Low carb diet  Encourage exercise She will call and make mammogram appt Had diabetic eye exam this month. We will get records faxed and scanned into chart.  RTO in 1 month and we will do lab work   I discussed the assessment and treatment plan with the patient. The patient was provided an opportunity to ask questions and all were answered. The patient agreed with the plan and demonstrated an understanding of the instructions.   The patient was advised to call back or seek an in-person evaluation if the symptoms worsen or if the condition fails to improve as anticipated.  The above assessment and management plan was discussed with the patient. The patient verbalized understanding of and has agreed to the management plan. Patient is aware to call the clinic if symptoms persist or worsen. Patient is aware when to return to the clinic for a follow-up visit. Patient educated on when it is appropriate to go to the emergency department.   Time call ended:  9:12 Am  I provided 12 minutes of non-face-to-face time during this encounter.    Evelina Dun, FNP

## 2020-01-10 ENCOUNTER — Encounter: Payer: Self-pay | Admitting: Family

## 2020-01-10 ENCOUNTER — Ambulatory Visit (INDEPENDENT_AMBULATORY_CARE_PROVIDER_SITE_OTHER): Payer: Medicare HMO | Admitting: Family

## 2020-01-10 VITALS — BP 111/61

## 2020-01-10 DIAGNOSIS — I959 Hypotension, unspecified: Secondary | ICD-10-CM

## 2020-01-10 DIAGNOSIS — E1165 Type 2 diabetes mellitus with hyperglycemia: Secondary | ICD-10-CM | POA: Diagnosis not present

## 2020-01-10 MED ORDER — LISINOPRIL 2.5 MG PO TABS: 2.5000 mg | ORAL_TABLET | Freq: Every day | ORAL | 1 refills | Status: DC

## 2020-01-10 NOTE — Progress Notes (Signed)
   Virtual Visit via telephone Note Due to COVID-19 pandemic this visit was conducted virtually. This visit type was conducted due to national recommendations for restrictions regarding the COVID-19 Pandemic (e.g. social distancing, sheltering in place) in an effort to limit this patient's exposure and mitigate transmission in our community. All issues noted in this document were discussed and addressed.  A physical exam was not performed with this format.  I connected with [name] on 01/10/20 at 11:19 AM by telephone and verified that I am speaking with the correct person using two identifiers. [name] is currently located at home and no one is currently with her during visit. The provider, Evelina Dun, FNP is located in their office at time of visit.  I discussed the limitations, risks, security and privacy concerns of performing an evaluation and management service by telephone and the availability of in person appointments. I also discussed with the patient that there may be a patient responsible charge related to this service. The patient expressed understanding and agreed to proceed.   History and Present Illness:  HPI  Pt calls the office today with complaints of hypotension. She states over the last few weeks she had moments when she stands up she feels light headed and dizzy. She reports her BP this morning was 111/61. However, when she felt dizzy it was 88/55.   She is a diabetic and currently taking linsopril 10mg  daily. She reports since starting her Ozempic her blood glucose has improved and her glucose this morning was 110.   Review of Systems  Neurological: Positive for dizziness.     Observations/Objective: No SOB or distress noted  Assessment and Plan: [name] comes in today with chief complaint of No chief complaint on file.   Diagnosis and orders addressed:  1. Hypotension, unspecified hypotension type - lisinopril (ZESTRIL) 2.5 MG  tablet; Take 1 tablet (2.5 mg total) by mouth daily.  Dispense: 90 tablet; Refill: 1  2. Type 2 diabetes mellitus with hyperglycemia, without long-term current use of insulin (HCC) - lisinopril (ZESTRIL) 2.5 MG tablet; Take 1 tablet (2.5 mg total) by mouth daily.  Dispense: 90 tablet; Refill: 1  We will decrease lisinopril to 2.5 mg from 10 mg Stay hydrated Low carb diet    I discussed the assessment and treatment plan with the patient. The patient was provided an opportunity to ask questions and all were answered. The patient agreed with the plan and demonstrated an understanding of the instructions.   The patient was advised to call back or seek an in-person evaluation if the symptoms worsen or if the condition fails to improve as anticipated.  The above assessment and management plan was discussed with the patient. The patient verbalized understanding of and has agreed to the management plan. Patient is aware to call the clinic if symptoms persist or worsen. Patient is aware when to return to the clinic for a follow-up visit. Patient educated on when it is appropriate to go to the emergency department.   Time call ended:  11:30 AM   I provided 11 minutes of non-face-to-face time during this encounter.    Evelina Dun, FNP

## 2020-01-20 NOTE — Patient Instructions (Signed)
Your procedure is scheduled on: 01/25/2020  Report to [facility] at  11:30   AM.  Call this number if you have problems the morning of surgery: [phone]   Remember:              Follow Directions on the letter you received from Your Physician's office regarding the Bowel Prep              No Smoking the day of Procedure :   Take these medicines the morning of surgery with A SIP OF WATER: Wellbutrin, lexapro, levothyroxine, pantoprazole and gabapentin if needed             No diabetic medication am of procedure   Do not wear jewelry, make-up or nail polish.    Do not bring valuables to the hospital.  Contacts, dentures or bridgework may not be worn into surgery.  .   Patients discharged the day of surgery will not be allowed to drive home.     Colonoscopy, Adult, Care After This sheet gives you information about how to care for yourself after your procedure. Your health care provider may also give you more specific instructions. If you have problems or questions, contact your health care provider. What can I expect after the procedure? After the procedure, it is common to have:  A small amount of blood in your stool for 24 hours after the procedure.  Some gas.  Mild abdominal cramping or bloating.  Follow these instructions at home: General instructions   For the first 24 hours after the procedure: ? Do not drive or use machinery. ? Do not sign important documents. ? Do not drink alcohol. ? Do your regular daily activities at a slower pace than normal. ? Eat soft, easy-to-digest foods. ? Rest often.  Take over-the-counter or prescription medicines only as told by your health care provider.  It is up to you to get the results of your procedure. Ask your health care provider, or the department performing the procedure, when your results will be ready. Relieving cramping and bloating  Try walking around when you have cramps or feel bloated.  Apply heat to your  abdomen as told by your health care provider. Use a heat source that your health care provider recommends, such as a moist heat pack or a heating pad. ? Place a towel between your skin and the heat source. ? Leave the heat on for 20-30 minutes. ? Remove the heat if your skin turns bright red. This is especially important if you are unable to feel pain, heat, or cold. You may have a greater risk of getting burned. Eating and drinking  Drink enough fluid to keep your urine clear or pale yellow.  Resume your normal diet as instructed by your health care provider. Avoid heavy or fried foods that are hard to digest.  Avoid drinking alcohol for as long as instructed by your health care provider. Contact a health care provider if:  You have blood in your stool 2-3 days after the procedure. Get help right away if:  You have more than a small spotting of blood in your stool.  You pass large blood clots in your stool.  Your abdomen is swollen.  You have nausea or vomiting.  You have a fever.  You have increasing abdominal pain that is not relieved with medicine. This information is not intended to replace advice given to you by your health care provider. Make sure you discuss any questions  you have with your health care provider. Document Released: 12/26/2003 Document Revised: 02/05/2016 Document Reviewed: 07/25/2015 Elsevier Interactive Patient Education  [employer].

## 2020-01-24 ENCOUNTER — Telehealth: Payer: Self-pay

## 2020-01-24 ENCOUNTER — Encounter (HOSPITAL_COMMUNITY)
Admission: RE | Admit: 2020-01-24 | Discharge: 2020-01-24 | Disposition: A | Discharge status: Home / Self Care | Payer: Medicare HMO | Source: Ambulatory Visit | Attending: Internal Medicine | Admitting: Internal Medicine

## 2020-01-24 ENCOUNTER — Other Ambulatory Visit (HOSPITAL_COMMUNITY)
Admission: RE | Admit: 2020-01-24 | Discharge: 2020-01-24 | Disposition: A | Discharge status: Home / Self Care | Payer: Medicare HMO | Source: Ambulatory Visit | Attending: Internal Medicine | Admitting: Internal Medicine

## 2020-01-24 ENCOUNTER — Other Ambulatory Visit: Payer: Self-pay

## 2020-01-24 DIAGNOSIS — Z20822 Contact with and (suspected) exposure to covid-19: Secondary | ICD-10-CM | POA: Insufficient documentation

## 2020-01-24 DIAGNOSIS — Z01818 Encounter for other preprocedural examination: Secondary | ICD-10-CM | POA: Insufficient documentation

## 2020-01-24 LAB — SARS CORONAVIRUS 2 (TAT 6-24 HRS): SARS Coronavirus 2: NEGATIVE

## 2020-01-24 NOTE — Telephone Encounter (Signed)
Tried to call pt to see if she can arrive earlier for TCS tomorrow, no answer, LMOVM for return call.

## 2020-01-24 NOTE — Telephone Encounter (Signed)
Spoke to pt, she is unable to arrive earlier for procedure d/t transportation. Endo scheduler informed.

## 2020-01-25 ENCOUNTER — Ambulatory Visit (HOSPITAL_COMMUNITY)
Admission: RE | Admit: 2020-01-25 | Discharge: 2020-01-25 | Disposition: A | Discharge status: Home / Self Care | Payer: Medicare HMO | Attending: Internal Medicine | Admitting: Internal Medicine

## 2020-01-25 ENCOUNTER — Ambulatory Visit (HOSPITAL_COMMUNITY): Payer: Medicare HMO | Admitting: Anesthesiology

## 2020-01-25 ENCOUNTER — Encounter (HOSPITAL_COMMUNITY)
Admission: RE | Disposition: A | Discharge status: Home / Self Care | Payer: Self-pay | Source: Home / Self Care | Attending: Internal Medicine

## 2020-01-25 ENCOUNTER — Other Ambulatory Visit: Payer: Self-pay

## 2020-01-25 DIAGNOSIS — Z7984 Long term (current) use of oral hypoglycemic drugs: Secondary | ICD-10-CM | POA: Diagnosis not present

## 2020-01-25 DIAGNOSIS — D122 Benign neoplasm of ascending colon: Secondary | ICD-10-CM | POA: Insufficient documentation

## 2020-01-25 DIAGNOSIS — K219 Gastro-esophageal reflux disease without esophagitis: Secondary | ICD-10-CM | POA: Diagnosis not present

## 2020-01-25 DIAGNOSIS — Z1211 Encounter for screening for malignant neoplasm of colon: Secondary | ICD-10-CM | POA: Diagnosis not present

## 2020-01-25 DIAGNOSIS — E039 Hypothyroidism, unspecified: Secondary | ICD-10-CM | POA: Diagnosis not present

## 2020-01-25 DIAGNOSIS — Z6841 Body Mass Index (BMI) 40.0 and over, adult: Secondary | ICD-10-CM | POA: Insufficient documentation

## 2020-01-25 DIAGNOSIS — E119 Type 2 diabetes mellitus without complications: Secondary | ICD-10-CM | POA: Diagnosis not present

## 2020-01-25 DIAGNOSIS — F329 Major depressive disorder, single episode, unspecified: Secondary | ICD-10-CM | POA: Diagnosis not present

## 2020-01-25 DIAGNOSIS — I10 Essential (primary) hypertension: Secondary | ICD-10-CM | POA: Insufficient documentation

## 2020-01-25 DIAGNOSIS — Z7982 Long term (current) use of aspirin: Secondary | ICD-10-CM | POA: Insufficient documentation

## 2020-01-25 DIAGNOSIS — Z79890 Hormone replacement therapy (postmenopausal): Secondary | ICD-10-CM | POA: Insufficient documentation

## 2020-01-25 DIAGNOSIS — R69 Illness, unspecified: Secondary | ICD-10-CM | POA: Diagnosis not present

## 2020-01-25 DIAGNOSIS — Z79899 Other long term (current) drug therapy: Secondary | ICD-10-CM | POA: Insufficient documentation

## 2020-01-25 DIAGNOSIS — K635 Polyp of colon: Secondary | ICD-10-CM | POA: Diagnosis not present

## 2020-01-25 HISTORY — PX: COLONOSCOPY WITH PROPOFOL: SHX5780

## 2020-01-25 HISTORY — PX: POLYPECTOMY: SHX5525

## 2020-01-25 LAB — GLUCOSE, CAPILLARY
Glucose-Capillary: 170 mg/dL — ABNORMAL HIGH (ref 70–99)
Glucose-Capillary: 140 mg/dL — ABNORMAL HIGH (ref 70–99)

## 2020-01-25 SURGERY — COLONOSCOPY WITH PROPOFOL
Anesthesia: General

## 2020-01-25 MED ORDER — CHLORHEXIDINE GLUCONATE CLOTH 2 % EX PADS: 6.0000 | MEDICATED_PAD | Freq: Once | CUTANEOUS | Status: DC

## 2020-01-25 MED ORDER — STERILE WATER FOR IRRIGATION IR SOLN
Status: DC | PRN
  Administered 2020-01-25: 1.5 mL

## 2020-01-25 MED ORDER — PROPOFOL 10 MG/ML IV BOLUS
INTRAVENOUS | Status: AC
  Filled 2020-01-25: qty 100

## 2020-01-25 MED ORDER — PROPOFOL 500 MG/50ML IV EMUL
INTRAVENOUS | Status: DC | PRN
  Administered 2020-01-25: 80 mg via INTRAVENOUS
  Administered 2020-01-25: 150 ug/kg/min via INTRAVENOUS

## 2020-01-25 MED ORDER — PHENYLEPHRINE HCL (PRESSORS) 10 MG/ML IV SOLN
INTRAVENOUS | Status: DC | PRN
  Administered 2020-01-25: 80 ug via INTRAVENOUS

## 2020-01-25 MED ORDER — LIDOCAINE HCL (CARDIAC) PF 50 MG/5ML IV SOSY
PREFILLED_SYRINGE | INTRAVENOUS | Status: DC | PRN
  Administered 2020-01-25: 100 mg via INTRAVENOUS

## 2020-01-25 MED ORDER — LIDOCAINE 2% (20 MG/ML) 5 ML SYRINGE
INTRAMUSCULAR | Status: AC
  Filled 2020-01-25: qty 5

## 2020-01-25 MED ORDER — LACTATED RINGERS IV SOLN: Freq: Once | INTRAVENOUS | Status: AC

## 2020-01-25 MED ORDER — PHENYLEPHRINE 40 MCG/ML (10ML) SYRINGE FOR IV PUSH (FOR BLOOD PRESSURE SUPPORT)
PREFILLED_SYRINGE | INTRAVENOUS | Status: AC
  Filled 2020-01-25: qty 10

## 2020-01-25 MED ORDER — LACTATED RINGERS IV SOLN: INTRAVENOUS | Status: DC | PRN

## 2020-01-25 NOTE — Anesthesia Postprocedure Evaluation (Signed)
Anesthesia Post Note  Patient: [name]  Procedure(s) Performed: COLONOSCOPY WITH PROPOFOL (N/A ) POLYPECTOMY  Patient location during evaluation: PACU Anesthesia Type: General Level of consciousness: awake, oriented, awake and alert and patient cooperative Pain management: pain level controlled Respiratory status: spontaneous breathing, respiratory function stable and nonlabored ventilation Cardiovascular status: blood pressure returned to baseline and stable Postop Assessment: no headache and no backache Anesthetic complications: no   No complications documented.   Last Vitals:  Vitals:   01/25/20 1128  BP: (!) 154/73  Pulse: 62  Resp: 15  Temp: 36.7 C  SpO2: 97%    Last Pain:  Vitals:   01/25/20 1217  TempSrc:   PainSc: 0-No pain                 Tacy Learn

## 2020-01-25 NOTE — H&P (Signed)
_0 @   Primary Care Physician:  Sharion Balloon, FNP Primary Gastroenterologist:  Dr. Gala Romney  Pre-Procedure History & Physical: HPI:  [name] is a 69 y.o. female is here for a screening colonoscopy.  Last colonoscopy about 11 or 12 years ago.  Unsure of polyps or not.  No bowel symptoms at this time.  No first-degree relatives with colon cancer.  Past Medical History:  Diagnosis Date  . Arthritis   . Depression    ONGOING PROBLEM  . Diabetes mellitus without complication ([place])    DX 2008   USES PILLS  . Dizziness and giddiness 06/29/2015  . GERD (gastroesophageal reflux disease)   . Hypertension   . Hypothyroidism   . Morbidly obese ([place])   . Morton's neuroma of left foot     Past Surgical History:  Procedure Laterality Date  . CARPAL TUNNEL RELEASE     LEFT  . CARPAL TUNNEL RELEASE Right 12/22/2012   Procedure: CARPAL TUNNEL RELEASE- RIGHT;  Surgeon: Meredith Pel, MD;  Location: [facility];  Service: Orthopedics;  Laterality: Right;  . SHOULDER ARTHROSCOPY W/ ROTATOR CUFF REPAIR     LEFT  . SHOULDER ARTHROSCOPY WITH ROTATOR CUFF REPAIR Right 12/22/2012   Procedure: SHOULDER ARTHROSCOPY WITH ROTATOR CUFF REPAIR AND BICEPS TENODESIS REPAIR;  Surgeon: Meredith Pel, MD;  Location: [facility];  Service: Orthopedics;  Laterality: Right;  Right Carpal Tunnel Release, Right Shoulder Arthroscopy, Rotator Cuff Tear Repair, Biceps Tenodesis  . TOE SURGERY     LEFT BIG TOE  . TUBAL LIGATION      Prior to Admission medications   Medication Sig Start Date End Date Taking? Authorizing Provider  aspirin EC 81 MG tablet Take 81 mg by mouth daily.   Yes [provider]  atorvastatin (LIPITOR) 10 MG tablet Take 10 mg by mouth daily. 07/26/19  Yes [provider]  buPROPion (WELLBUTRIN XL) 150 MG 24 hr tablet Take 1 tablet (150 mg total) by mouth daily. Patient taking differently: Take 150 mg by mouth every evening.  11/01/19  Yes Hawks, Christy A, FNP  escitalopram  (LEXAPRO) 20 MG tablet Take 1 tablet (20 mg total) by mouth daily. Patient taking differently: Take 20 mg by mouth daily at 12 noon.  11/01/19  Yes Hawks, Christy A, FNP  gabapentin (NEURONTIN) 100 MG capsule Take 1-3 capsules (100-300 mg total) by mouth at bedtime. Patient taking differently: Take 100 mg by mouth at bedtime.  06/18/19  Yes Terald Sleeper, PA-C  levothyroxine (SYNTHROID) 100 MCG tablet TAKE 1 TABLET BY MOUTH ONCE DAILY BEFORE BREAKFAST Patient taking differently: Take 100 mcg by mouth daily before breakfast.  07/22/19  Yes Terald Sleeper, PA-C  lisinopril (ZESTRIL) 2.5 MG tablet Take 1 tablet (2.5 mg total) by mouth daily. Patient taking differently: Take 2.5 mg by mouth every evening.  01/10/20  Yes Hawks, Alyse Low A, FNP  metFORMIN (GLUCOPHAGE) 1000 MG tablet Take 1 tablet (1,000 mg total) by mouth 2 (two) times daily with a meal. 06/18/19  Yes Terald Sleeper, PA-C  pantoprazole (PROTONIX) 40 MG tablet Take 1 tablet (40 mg total) by mouth daily before breakfast. 12/22/19  Yes Mahala Menghini, PA-C  Semaglutide,0.25 or 0.5MG/DOS, (OZEMPIC, 0.25 OR 0.5 MG/DOSE,) 2 MG/1.5ML SOPN Inject 0.375 mLs (0.5 mg total) into the skin once a week. Patient taking differently: Inject 0.5 mg into the skin every Sunday at 6pm.  01/03/20  Yes Hawks, Alyse Low A, FNP  blood glucose meter kit and supplies  KIT Dispense based on patient and insurance preference. Use up to four times daily as directed. (FOR ICD-9 250.00, 250.01). 05/29/16   Terald Sleeper, PA-C  glucose blood (ONETOUCH VERIO) test strip Use to check blood sugars up to 4 times daily 07/03/17   Terald Sleeper, PA-C  [facility] DELICA LANCETS 78I MISC Use to check blood sugars up to 4 times daily 07/03/17   Terald Sleeper, PA-C    Allergies as of 12/22/2019 - Review Complete 12/22/2019  Allergen Reaction Noted  . Penicillins Other (See Comments) 12/08/2012  . Shrimp [shellfish allergy] Itching 12/08/2012  . Sulfa antibiotics Other (See Comments) 12/08/2012     Family History  Problem Relation Age of Onset  . Heart attack Mother   . Cancer Father        brain  . Heart failure Father   . Cancer Brother        Prostate  . Leukemia Brother   . Dementia Sister        alzheimers  . Heart disease Sister   . Diabetes Sister   . Hypertension Sister   . Hyperlipidemia Sister   . Heart failure Sister   . Colon cancer Cousin        85    Social History   Socioeconomic History  . Marital status: Widowed    Spouse name: Not on file  . Number of children: 1  . Years of education: 81  . Highest education level: Not on file  Occupational History  . Occupation: disability  Tobacco Use  . Smoking status: Never Smoker  . Smokeless tobacco: Never Used  Vaping Use  . Vaping Use: Never used  Substance and Sexual Activity  . Alcohol use: No  . Drug use: No  . Sexual activity: Not on file  Other Topics Concern  . Not on file  Social History Narrative   Patient drinks 1 soda daily.   Patient is left handed.    Social Determinants of Health   Financial Resource Strain:   . Difficulty of Paying Living Expenses: Not on file  Food Insecurity:   . Worried About Charity fundraiser in the Last Year: Not on file  . Ran Out of Food in the Last Year: Not on file  Transportation Needs:   . Lack of Transportation (Medical): Not on file  . Lack of Transportation (Non-Medical): Not on file  Physical Activity:   . Days of Exercise per Week: Not on file  . Minutes of Exercise per Session: Not on file  Stress:   . Feeling of Stress : Not on file  Social Connections:   . Frequency of Communication with Friends and Family: Not on file  . Frequency of Social Gatherings with Friends and Family: Not on file  . Attends Religious Services: Not on file  . Active Member of Clubs or Organizations: Not on file  . Attends Archivist Meetings: Not on file  . Marital Status: Not on file  Intimate Partner Violence:   . Fear of Current or  Ex-Partner: Not on file  . Emotionally Abused: Not on file  . Physically Abused: Not on file  . Sexually Abused: Not on file    Review of Systems: See HPI, otherwise negative ROS  Physical Exam: BP (!) 154/73   Pulse 62   Temp 98.1 F (36.7 C) (Oral)   Resp 15   Ht 4' 10.5" (1.486 m)   Wt 95.3 kg   SpO2  97%   BMI 43.16 kg/m  General:   Alert,  Well-developed, well-nourished, pleasant and cooperative in NAD Lungs:  Clear throughout to auscultation.   No wheezes, crackles, or rhonchi. No acute distress. Heart:  Regular rate and rhythm; no murmurs, clicks, rubs,  or gallops. Abdomen:  Soft, nontender and nondistended. No masses, hepatosplenomegaly or hernias noted. Normal bowel sounds, without guarding, and without rebound.    Impression/Plan: [name] is now here to undergo a screening colonoscopy.  Without having further information, she is due for an average rescreening colonoscopy today.  Risks, benefits, limitations, imponderables and alternatives regarding colonoscopy have been reviewed with the patient. Questions have been answered. All parties agreeable.     Notice:  This dictation was prepared with Dragon dictation along with smaller phrase technology. Any transcriptional errors that result from this process are unintentional and may not be corrected upon review.

## 2020-01-25 NOTE — Transfer of Care (Signed)
Immediate Anesthesia Transfer of Care Note  Patient: [name]  Procedure(s) Performed: COLONOSCOPY WITH PROPOFOL (N/A ) POLYPECTOMY  Patient Location: PACU  Anesthesia Type:General  Level of Consciousness: awake, alert , oriented and patient cooperative  Airway & Oxygen Therapy: Patient Spontanous Breathing and Patient connected to nasal cannula oxygen  Post-op Assessment: Report given to RN, Post -op Vital signs reviewed and stable and Patient moving all extremities  Post vital signs: Reviewed and stable  Last Vitals:  Vitals Value Taken Time  BP    Temp    Pulse    Resp    SpO2      Last Pain:  Vitals:   01/25/20 1217  TempSrc:   PainSc: 0-No pain         Complications: No complications documented.

## 2020-01-25 NOTE — Op Note (Signed)
[facility] Patient Name: [name] Procedure Date: 01/25/2020 11:40 AM MRN: [id number] Date of Birth: Jan 16, 1951 Attending MD: Gennette Pac , MD CSN: [id number] Age: 69 Admit Type: Outpatient Procedure:                Colonoscopy Indications:              Screening for colorectal malignant neoplasm Providers:                Gennette Pac, MD, Jannett Celestine, RN, Sheran Fava, Cyril Mourning, Technician, Dyann Ruddle Referring MD:              Medicines:                Propofol per Anesthesia Complications:            No immediate complications. Estimated Blood Loss:     Estimated blood loss was minimal. Procedure:                Pre-Anesthesia Assessment:                           - Prior to the procedure, a History and Physical                            was performed, and patient medications and                            allergies were reviewed. The patient's tolerance of                            previous anesthesia was also reviewed. The risks                            and benefits of the procedure and the sedation                            options and risks were discussed with the patient.                            All questions were answered, and informed consent                            was obtained. Prior Anticoagulants: The patient has                            taken no previous anticoagulant or antiplatelet                            agents. ASA Grade Assessment: II - A patient with  mild systemic disease. After reviewing the risks                            and benefits, the patient was deemed in                            satisfactory condition to undergo the procedure.                           After obtaining informed consent, the colonoscope                            was passed under direct vision. Throughout the                             procedure, the patient's blood pressure, pulse, and                            oxygen saturations were monitored continuously. The                            CF-HQ190L ([id number]) scope was introduced through                            the anus and advanced to the the cecum, identified                            by appendiceal orifice and ileocecal valve. The                            colonoscopy was performed without difficulty. The                            patient tolerated the procedure well. The quality                            of the bowel preparation was adequate. Scope In: 12:20:36 PM Scope Out: 12:42:22 PM Total Procedure Duration: 0 hours 21 minutes 46 seconds  Findings:      The perianal and digital rectal examinations were normal. Redundant       colon. External abdominal pressure required to reach the cecum.      Six sessile polyps were found in the ascending colon. 3 to 6 mm in       dimensions. These polyps were removed with a cold snare. Resection and       retrieval were complete. Estimated blood loss was minimal.      The exam was otherwise normal throughout the examined colon.      The retroflexed view of the distal rectum and anal verge was normal and       showed no anal or rectal abnormalities. Impression:               - Six polyps in the ascending colon, removed with a  cold snare. Resected and retrieved.                           - The distal rectum and anal verge are normal on                            retroflexion view. Moderate Sedation:      Moderate (conscious) sedation was personally administered by an       anesthesia professional. The following parameters were monitored: oxygen       saturation, heart rate, blood pressure, respiratory rate, EKG, adequacy       of pulmonary ventilation, and response to care. Recommendation:           - Patient has a contact number available for                            emergencies. The signs  and symptoms of potential                            delayed complications were discussed with the                            patient. Return to normal activities tomorrow.                            Written discharge instructions were provided to the                            patient.                           - Advance diet as tolerated.                           - Continue present medications.                           - Repeat colonoscopy date to be determined after                            pending pathology results are reviewed for                            surveillance.                           - Return to GI office (date not yet determined). Procedure Code(s):        --- Professional ---                           [phone], Colonoscopy, flexible; with removal of                            tumor(s), polyp(s), or other lesion(s) by snare  technique Diagnosis Code(s):        --- Professional ---                           Z12.11, Encounter for screening for malignant                            neoplasm of colon                           K63.5, Polyp of colon CPT copyright 2019 American Medical Association. All rights reserved. The codes documented in this report are preliminary and upon coder review may  be revised to meet current compliance requirements. Gerrit Friends. Shaliyah Taite, MD Gennette Pac, MD 01/25/2020 12:55:22 PM This report has been signed electronically. Number of Addenda: 0

## 2020-01-25 NOTE — Discharge Instructions (Signed)
Colonoscopy Discharge Instructions  Read the instructions outlined below and refer to this sheet in the next few weeks. These discharge instructions provide you with general information on caring for yourself after you leave the hospital. Your doctor may also give you specific instructions. While your treatment has been planned according to the most current medical practices available, unavoidable complications occasionally occur. If you have any problems or questions after discharge, call Dr. Gala Romney at [phone]. ACTIVITY  You may resume your regular activity, but move at a slower pace for the next 24 hours.   Take frequent rest periods for the next 24 hours.   Walking will help get rid of the air and reduce the bloated feeling in your belly (abdomen).   No driving for 24 hours (because of the medicine (anesthesia) used during the test).    Do not sign any important legal documents or operate any machinery for 24 hours (because of the anesthesia used during the test).  NUTRITION  Drink plenty of fluids.   You may resume your normal diet as instructed by your doctor.   Begin with a light meal and progress to your normal diet. Heavy or fried foods are harder to digest and may make you feel sick to your stomach (nauseated).   Avoid alcoholic beverages for 24 hours or as instructed.  MEDICATIONS  You may resume your normal medications unless your doctor tells you otherwise.  WHAT YOU CAN EXPECT TODAY  Some feelings of bloating in the abdomen.   Passage of more gas than usual.   Spotting of blood in your stool or on the toilet paper.  IF YOU HAD POLYPS REMOVED DURING THE COLONOSCOPY:  No aspirin products for 7 days or as instructed.   No alcohol for 7 days or as instructed.   Eat a soft diet for the next 24 hours.  FINDING OUT THE RESULTS OF YOUR TEST Not all test results are available during your visit. If your test results are not back during the visit, make an appointment  with your caregiver to find out the results. Do not assume everything is normal if you have not heard from your caregiver or the medical facility. It is important for you to follow up on all of your test results.  SEEK IMMEDIATE MEDICAL ATTENTION IF:  You have more than a spotting of blood in your stool.   Your belly is swollen (abdominal distention).   You are nauseated or vomiting.   You have a temperature over 101.   You have abdominal pain or discomfort that is severe or gets worse throughout the day.   6 polyps removed in your colon today  Further recommendations to follow pending review of pathology report  Patient request, I called Joycelyn Rua at [phone] -reviewed results   Monitored Anesthesia Care, Care After These instructions provide you with information about caring for yourself after your procedure. Your health care provider may also give you more specific instructions. Your treatment has been planned according to current medical practices, but problems sometimes occur. Call your health care provider if you have any problems or questions after your procedure. What can I expect after the procedure? After your procedure, you may:  Feel sleepy for several hours.  Feel clumsy and have poor balance for several hours.  Feel forgetful about what happened after the procedure.  Have poor judgment for several hours.  Feel nauseous or vomit.  Have a sore throat if you had a breathing tube during the procedure.  Follow these instructions at home: For at least 24 hours after the procedure:      Have a responsible adult stay with you. It is important to have someone help care for you until you are awake and alert.  Rest as needed.  Do not: ? Participate in activities in which you could fall or become injured. ? Drive. ? Use heavy machinery. ? Drink alcohol. ? Take sleeping pills or medicines that cause drowsiness. ? Make important decisions or sign legal  documents. ? Take care of children on your own. Eating and drinking  Follow the diet that is recommended by your health care provider.  If you vomit, drink water, juice, or soup when you can drink without vomiting.  Make sure you have little or no nausea before eating solid foods. General instructions  Take over-the-counter and prescription medicines only as told by your health care provider.  If you have sleep apnea, surgery and certain medicines can increase your risk for breathing problems. Follow instructions from your health care provider about wearing your sleep device: ? Anytime you are sleeping, including during daytime naps. ? While taking prescription pain medicines, sleeping medicines, or medicines that make you drowsy.  If you smoke, do not smoke without supervision.  Keep all follow-up visits as told by your health care provider. This is important. Contact a health care provider if:  You keep feeling nauseous or you keep vomiting.  You feel light-headed.  You develop a rash.  You have a fever. Get help right away if:  You have trouble breathing. Summary  For several hours after your procedure, you may feel sleepy and have poor judgment.  Have a responsible adult stay with you for at least 24 hours or until you are awake and alert. This information is not intended to replace advice given to you by your health care provider. Make sure you discuss any questions you have with your health care provider. Document Revised: 08/11/2017 Document Reviewed: 09/03/2015 Elsevier Patient Education  [place].

## 2020-01-25 NOTE — Anesthesia Preprocedure Evaluation (Addendum)
Anesthesia Evaluation  Patient identified by MRN, date of birth, ID band Patient awake    Reviewed: Allergy & Precautions, NPO status , Patient's Chart, lab work & pertinent test results  Airway Mallampati: II  TM Distance: >3 FB Neck ROM: Full    Dental  (+) Teeth Intact, Dental Advisory Given Cavities :   Pulmonary neg pulmonary ROS,    Pulmonary exam normal breath sounds clear to auscultation       Cardiovascular Exercise Tolerance: Good hypertension, Pt. on medications Normal cardiovascular exam Rhythm:Regular Rate:Normal     Neuro/Psych PSYCHIATRIC DISORDERS Depression  Neuromuscular disease    GI/Hepatic GERD  Medicated and Controlled,  Endo/Other  diabetes, Well Controlled, Type 2, Oral Hypoglycemic AgentsHypothyroidism Morbid obesity  Renal/GU      Musculoskeletal  (+) Arthritis , Osteoarthritis,    Abdominal   Peds  Hematology   Anesthesia Other Findings   Reproductive/Obstetrics                           Anesthesia Physical Anesthesia Plan  ASA: III  Anesthesia Plan: General   Post-op Pain Management:    Induction: Intravenous  PONV Risk Score and Plan: TIVA  Airway Management Planned: Nasal Cannula and Natural Airway  Additional Equipment:   Intra-op Plan:   Post-operative Plan:   Informed Consent: I have reviewed the patients History and Physical, chart, labs and discussed the procedure including the risks, benefits and alternatives for the proposed anesthesia with the patient or authorized representative who has indicated his/her understanding and acceptance.     Dental advisory given  Plan Discussed with: CRNA and Surgeon  Anesthesia Plan Comments:        Anesthesia Quick Evaluation

## 2020-01-26 ENCOUNTER — Encounter (HOSPITAL_COMMUNITY): Payer: Self-pay | Admitting: Internal Medicine

## 2020-01-26 ENCOUNTER — Encounter: Payer: Self-pay | Admitting: Internal Medicine

## 2020-01-26 LAB — SURGICAL PATHOLOGY

## 2020-02-16 ENCOUNTER — Encounter: Payer: Self-pay | Admitting: Family

## 2020-02-16 ENCOUNTER — Other Ambulatory Visit: Payer: Self-pay

## 2020-02-16 ENCOUNTER — Ambulatory Visit: Payer: Medicare HMO

## 2020-02-16 ENCOUNTER — Ambulatory Visit (INDEPENDENT_AMBULATORY_CARE_PROVIDER_SITE_OTHER): Payer: Medicare HMO | Admitting: Family

## 2020-02-16 VITALS — BP 137/76 | HR 68 | Temp 97.6°F | Ht <= 58 in | Wt 202.8 lb

## 2020-02-16 DIAGNOSIS — Z23 Encounter for immunization: Secondary | ICD-10-CM

## 2020-02-16 DIAGNOSIS — E1169 Type 2 diabetes mellitus with other specified complication: Secondary | ICD-10-CM | POA: Diagnosis not present

## 2020-02-16 DIAGNOSIS — M8949 Other hypertrophic osteoarthropathy, multiple sites: Secondary | ICD-10-CM | POA: Diagnosis not present

## 2020-02-16 DIAGNOSIS — M159 Polyosteoarthritis, unspecified: Secondary | ICD-10-CM

## 2020-02-16 DIAGNOSIS — E039 Hypothyroidism, unspecified: Secondary | ICD-10-CM

## 2020-02-16 DIAGNOSIS — K219 Gastro-esophageal reflux disease without esophagitis: Secondary | ICD-10-CM

## 2020-02-16 DIAGNOSIS — E1165 Type 2 diabetes mellitus with hyperglycemia: Secondary | ICD-10-CM

## 2020-02-16 DIAGNOSIS — E785 Hyperlipidemia, unspecified: Secondary | ICD-10-CM | POA: Diagnosis not present

## 2020-02-16 DIAGNOSIS — I10 Essential (primary) hypertension: Secondary | ICD-10-CM

## 2020-02-16 DIAGNOSIS — F3341 Major depressive disorder, recurrent, in partial remission: Secondary | ICD-10-CM

## 2020-02-16 DIAGNOSIS — R69 Illness, unspecified: Secondary | ICD-10-CM | POA: Diagnosis not present

## 2020-02-16 LAB — BAYER DCA HB A1C WAIVED: HB A1C (BAYER DCA - WAIVED): 7.2 % — ABNORMAL HIGH (ref <7.0)

## 2020-02-16 NOTE — Patient Instructions (Signed)

## 2020-02-16 NOTE — Progress Notes (Signed)
Subjective:    Patient ID: [name], female    DOB: 1951-04-02, 69 y.o.   MRN: [id number]  Chief Complaint  Patient presents with  . Hypertension  . Diabetes   Pt presents to the office today for chronic follow up.  Hypertension This is a chronic problem. The current episode started more than 1 year ago. The problem has been resolved since onset. Pertinent negatives include no blurred vision, malaise/fatigue, palpitations or shortness of breath. Risk factors for coronary artery disease include obesity, sedentary lifestyle, dyslipidemia and diabetes mellitus. The current treatment provides significant improvement. There is no history of CVA, heart failure or PVD. Identifiable causes of hypertension include a thyroid problem.  Diabetes She presents for her follow-up diabetic visit. She has type 2 diabetes mellitus. Her disease course has been stable. Pertinent negatives for diabetes include no blurred vision and no foot paresthesias. Symptoms are stable. Pertinent negatives for diabetic complications include no CVA, heart disease, nephropathy, peripheral neuropathy or PVD. Risk factors for coronary artery disease include sedentary lifestyle, post-menopausal, hypertension, dyslipidemia and diabetes mellitus. She is following a generally healthy diet. Her overall blood glucose range is 130-140 mg/dl. An ACE inhibitor/angiotensin II receptor blocker is being taken. Eye exam is current.  Gastroesophageal Reflux She complains of belching, heartburn, a hoarse voice and nausea. This is a chronic problem. The current episode started more than 1 year ago. The problem occurs occasionally. The problem has been waxing and waning. Risk factors include obesity. She has tried a PPI for the symptoms. The treatment provided moderate relief.  Thyroid Problem Presents for follow-up visit. Symptoms include hoarse voice. Patient reports no constipation, diaphoresis, dry skin, hair loss or palpitations. The  symptoms have been stable. Her past medical history is significant for hyperlipidemia. There is no history of heart failure.  Hyperlipidemia This is a chronic problem. The current episode started more than 1 year ago. Pertinent negatives include no shortness of breath. The current treatment provides moderate improvement of lipids. Risk factors for coronary artery disease include a sedentary lifestyle, post-menopausal, hypertension, dyslipidemia and diabetes mellitus.  Arthritis Presents for follow-up visit. Affected locations include the right MCP and left MCP. Her pain is at a severity of 1/10.  Depression        This is a chronic problem.  The current episode started more than 1 year ago.   The onset quality is gradual.   The problem occurs intermittently.  Associated symptoms include restlessness and sad.  Associated symptoms include no helplessness, no hopelessness and not irritable.  Past medical history includes thyroid problem.       Review of Systems  Constitutional: Negative for diaphoresis and malaise/fatigue.  HENT: Positive for hoarse voice.   Eyes: Negative for blurred vision.  Respiratory: Negative for shortness of breath.   Cardiovascular: Negative for palpitations.  Gastrointestinal: Positive for heartburn and nausea. Negative for constipation.  Musculoskeletal: Positive for arthritis.  Psychiatric/Behavioral: Positive for depression.  All other systems reviewed and are negative.      Objective:   Physical Exam Vitals reviewed.  Constitutional:      General: She is not irritable.She is not in acute distress.    Appearance: She is well-developed.  HENT:     Head: Normocephalic and atraumatic.     Right Ear: Tympanic membrane normal.     Left Ear: Tympanic membrane normal.  Eyes:     Pupils: Pupils are equal, round, and reactive to light.  Neck:     Thyroid:  No thyromegaly.  Cardiovascular:     Rate and Rhythm: Normal rate and regular rhythm.     Heart sounds:  Normal heart sounds. No murmur heard.   Pulmonary:     Effort: Pulmonary effort is normal. No respiratory distress.     Breath sounds: Normal breath sounds. No wheezing.  Abdominal:     General: Bowel sounds are normal. There is no distension.     Palpations: Abdomen is soft.     Tenderness: There is no abdominal tenderness.  Musculoskeletal:        General: No tenderness. Normal range of motion.     Cervical back: Normal range of motion and neck supple.  Skin:    General: Skin is warm and dry.  Neurological:     Mental Status: She is alert and oriented to person, place, and time.     Cranial Nerves: No cranial nerve deficit.     Deep Tendon Reflexes: Reflexes are normal and symmetric.  Psychiatric:        Behavior: Behavior normal.        Thought Content: Thought content normal.        Judgment: Judgment normal.       BP 137/76   Pulse 68   Temp 97.6 F (36.4 C) (Temporal)   Ht '4\' 10"'  (1.473 m)   Wt 202 lb 12.8 oz (92 kg)   SpO2 96%   BMI 42.39 kg/m      Assessment & Plan:  [name] comes in today with chief complaint of Hypertension and Diabetes   Diagnosis and orders addressed:  1. Essential hypertension - CMP14+EGFR  2. Gastroesophageal reflux disease, unspecified whether esophagitis present - CMP14+EGFR  3. Type 2 diabetes mellitus with hyperglycemia, without long-term current use of insulin (HCC) - Bayer DCA Hb A1c Waived - CMP14+EGFR - Microalbumin / creatinine urine ratio  4. Hyperlipidemia associated with type 2 diabetes mellitus (HCC) - CMP14+EGFR  5. Hypothyroidism, unspecified type - CMP14+EGFR  6. Primary osteoarthritis involving multiple joints - CMP14+EGFR  7. Recurrent major depressive disorder, in partial remission ([place]) - CMP14+EGFR   Labs pending Health Maintenance reviewed Diet and exercise encouraged  Follow up plan: 3 months    Madison Dun, FNP

## 2020-02-17 ENCOUNTER — Other Ambulatory Visit: Payer: Self-pay | Admitting: Family

## 2020-02-17 DIAGNOSIS — Z1231 Encounter for screening mammogram for malignant neoplasm of breast: Secondary | ICD-10-CM

## 2020-02-17 LAB — MICROALBUMIN / CREATININE URINE RATIO
Creatinine, Urine: 136.4 mg/dL
Microalb/Creat Ratio: 19 mg/g creat (ref 0–29)
Microalbumin, Urine: 26.4 ug/mL

## 2020-02-17 LAB — CMP14+EGFR
ALT: 57 IU/L — ABNORMAL HIGH (ref 0–32)
AST: 39 IU/L (ref 0–40)
Albumin/Globulin Ratio: 1.8 (ref 1.2–2.2)
Albumin: 4.1 g/dL (ref 3.8–4.8)
Alkaline Phosphatase: 88 IU/L (ref 44–121)
BUN/Creatinine Ratio: 19 (ref 12–28)
BUN: 14 mg/dL (ref 8–27)
Bilirubin Total: 0.3 mg/dL (ref 0.0–1.2)
CO2: 24 mmol/L (ref 20–29)
Calcium: 10.1 mg/dL (ref 8.7–10.3)
Chloride: 103 mmol/L (ref 96–106)
Creatinine, Ser: 0.75 mg/dL (ref 0.57–1.00)
GFR calc Af Amer: 95 mL/min/{1.73_m2} (ref >59)
GFR calc non Af Amer: 83 mL/min/{1.73_m2} (ref >59)
Globulin, Total: 2.3 g/dL (ref 1.5–4.5)
Glucose: 144 mg/dL — ABNORMAL HIGH (ref 65–99)
Potassium: 5 mmol/L (ref 3.5–5.2)
Sodium: 142 mmol/L (ref 134–144)
Total Protein: 6.4 g/dL (ref 6.0–8.5)

## 2020-03-24 ENCOUNTER — Other Ambulatory Visit: Payer: Self-pay

## 2020-03-24 ENCOUNTER — Ambulatory Visit
Admission: RE | Admit: 2020-03-24 | Discharge: 2020-03-24 | Disposition: A | Discharge status: Home / Self Care | Payer: Medicare HMO | Source: Ambulatory Visit | Attending: Family | Admitting: Family

## 2020-03-24 ENCOUNTER — Ambulatory Visit (INDEPENDENT_AMBULATORY_CARE_PROVIDER_SITE_OTHER): Payer: Medicare HMO

## 2020-03-24 ENCOUNTER — Other Ambulatory Visit: Payer: Medicare HMO

## 2020-03-24 DIAGNOSIS — Z780 Asymptomatic menopausal state: Secondary | ICD-10-CM

## 2020-03-24 DIAGNOSIS — Z1231 Encounter for screening mammogram for malignant neoplasm of breast: Secondary | ICD-10-CM

## 2020-03-27 ENCOUNTER — Encounter: Payer: Self-pay | Admitting: Internal Medicine

## 2020-03-27 ENCOUNTER — Ambulatory Visit: Payer: Medicare HMO | Admitting: Gastroenterology

## 2020-03-27 ENCOUNTER — Encounter: Payer: Self-pay | Admitting: Gastroenterology

## 2020-03-27 NOTE — Progress Notes (Deleted)
    Primary Care Physician: Hawks, Christy A, FNP  Primary Gastroenterologist:  Michael Rourk, MD   No chief complaint on file.   HPI: [name] is a 69 y.o. female here  Colonoscopy completed back in August.  6 tubular adenomas removed from the colon, recommended 3-year surveillance colonoscopy.  Current Outpatient Medications  Medication Sig Dispense Refill  . aspirin EC 81 MG tablet Take 81 mg by mouth daily.    . atorvastatin (LIPITOR) 10 MG tablet Take 10 mg by mouth daily.    . blood glucose meter kit and supplies KIT Dispense based on patient and insurance preference. Use up to four times daily as directed. (FOR ICD-9 250.00, 250.01). 1 each 11  . buPROPion (WELLBUTRIN XL) 150 MG 24 hr tablet Take 1 tablet (150 mg total) by mouth daily. (Patient taking differently: Take 150 mg by mouth every evening. ) 90 tablet 3  . escitalopram (LEXAPRO) 20 MG tablet Take 1 tablet (20 mg total) by mouth daily. (Patient taking differently: Take 20 mg by mouth daily at 12 noon. ) 90 tablet 1  . gabapentin (NEURONTIN) 100 MG capsule Take 1-3 capsules (100-300 mg total) by mouth at bedtime. (Patient taking differently: Take 100 mg by mouth at bedtime. ) 90 capsule 3  . glucose blood (ONETOUCH VERIO) test strip Use to check blood sugars up to 4 times daily 400 each 3  . levothyroxine (SYNTHROID) 100 MCG tablet TAKE 1 TABLET BY MOUTH ONCE DAILY BEFORE BREAKFAST (Patient taking differently: Take 100 mcg by mouth daily before breakfast. ) 90 tablet 3  . lisinopril (ZESTRIL) 2.5 MG tablet Take 1 tablet (2.5 mg total) by mouth daily. (Patient taking differently: Take 2.5 mg by mouth every evening. ) 90 tablet 1  . metFORMIN (GLUCOPHAGE) 1000 MG tablet Take 1 tablet (1,000 mg total) by mouth 2 (two) times daily with a meal. 180 tablet 3  . ONETOUCH DELICA LANCETS 33G MISC Use to check blood sugars up to 4 times daily 400 each 3  . pantoprazole (PROTONIX) 40 MG tablet Take 1 tablet (40 mg total) by  mouth daily before breakfast. 90 tablet 3  . Semaglutide,0.25 or 0.5MG/DOS, (OZEMPIC, 0.25 OR 0.5 MG/DOSE,) 2 MG/1.5ML SOPN Inject 0.375 mLs (0.5 mg total) into the skin once a week. (Patient taking differently: Inject 0.5 mg into the skin every Sunday at 6pm. ) 1.5 mL 3   No current facility-administered medications for this visit.    Allergies as of 03/27/2020 - Review Complete 02/16/2020  Allergen Reaction Noted  . Penicillins Other (See Comments) 12/08/2012  . Shrimp [shellfish allergy] Itching 12/08/2012  . Sulfa antibiotics Other (See Comments) 12/08/2012    ROS:  General: Negative for anorexia, weight loss, fever, chills, fatigue, weakness. ENT: Negative for hoarseness, difficulty swallowing , nasal congestion. CV: Negative for chest pain, angina, palpitations, dyspnea on exertion, peripheral edema.  Respiratory: Negative for dyspnea at rest, dyspnea on exertion, cough, sputum, wheezing.  GI: See history of present illness. GU:  Negative for dysuria, hematuria, urinary incontinence, urinary frequency, nocturnal urination.  Endo: Negative for unusual weight change.    Physical Examination:   There were no vitals taken for this visit.  General: Well-nourished, well-developed in no acute distress.  Eyes: No icterus. Mouth: Oropharyngeal mucosa moist and pink , no lesions erythema or exudate. Lungs: Clear to auscultation bilaterally.  Heart: Regular rate and rhythm, no murmurs rubs or gallops.  Abdomen: Bowel sounds are normal, nontender, nondistended, no hepatosplenomegaly or masses,   no abdominal bruits or hernia , no rebound or guarding.   Extremities: No lower extremity edema. No clubbing or deformities. Neuro: Alert and oriented x 4   Skin: Warm and dry, no jaundice.   Psych: Alert and cooperative, normal mood and affect.  Labs:  ***  Imaging Studies: No results found.     

## 2020-03-28 ENCOUNTER — Other Ambulatory Visit: Payer: Self-pay | Admitting: Family

## 2020-03-28 DIAGNOSIS — M85852 Other specified disorders of bone density and structure, left thigh: Secondary | ICD-10-CM | POA: Diagnosis not present

## 2020-03-28 DIAGNOSIS — M8580 Other specified disorders of bone density and structure, unspecified site: Secondary | ICD-10-CM

## 2020-04-11 ENCOUNTER — Other Ambulatory Visit: Payer: Self-pay

## 2020-04-11 ENCOUNTER — Telehealth: Payer: Self-pay

## 2020-04-11 DIAGNOSIS — E119 Type 2 diabetes mellitus without complications: Secondary | ICD-10-CM

## 2020-04-11 MED ORDER — GLUCOSE BLOOD VI STRP: ORAL_STRIP | 3 refills | Status: DC

## 2020-04-11 MED ORDER — ONETOUCH VERIO VI STRP
ORAL_STRIP | 3 refills | Status: DC
Start: 2020-04-11 — End: 2020-04-11

## 2020-04-11 NOTE — Telephone Encounter (Signed)
  Prescription Request  04/11/2020  What is the name of the medication or equipment? Diabetic test strips  Have you contacted your pharmacy to request a refill? (if applicable) yes  Which pharmacy would you like this sent to? [facility] [place]    Patient notified that their request is being sent to the clinical staff for review and that they should receive a response within 2 business days.

## 2020-04-11 NOTE — Telephone Encounter (Signed)
rx for test strips sent to pharmacy and pt is aware.

## 2020-04-22 DIAGNOSIS — R69 Illness, unspecified: Secondary | ICD-10-CM | POA: Diagnosis not present

## 2020-05-03 ENCOUNTER — Other Ambulatory Visit: Payer: Self-pay

## 2020-05-03 ENCOUNTER — Encounter: Payer: Self-pay | Admitting: Gastroenterology

## 2020-05-03 ENCOUNTER — Ambulatory Visit: Payer: Medicare HMO | Admitting: Gastroenterology

## 2020-05-03 VITALS — BP 151/75 | HR 79 | Temp 96.9°F | Ht <= 58 in | Wt 199.8 lb

## 2020-05-03 DIAGNOSIS — K219 Gastro-esophageal reflux disease without esophagitis: Secondary | ICD-10-CM | POA: Diagnosis not present

## 2020-05-03 NOTE — Patient Instructions (Signed)
1. Continue pantoprazole 40 mg daily before breakfast. 2. Try to be as active as possible.  Continue weight loss efforts. 3. Recommend follow-up labs with PCP as per routine.  If your LFTs remain elevated or climb, would recommend liver ultrasound. 4. Return to the office in 1 year or sooner if needed.

## 2020-05-03 NOTE — Progress Notes (Signed)
Cc'ed to pcp °

## 2020-05-03 NOTE — Progress Notes (Signed)
Primary Care Physician: Sharion Balloon, FNP  Primary Gastroenterologist:  Garfield Cornea, MD   Chief Complaint  Patient presents with  . Gastroesophageal Reflux    symptoms improving, takes pantoprazole daily    HPI: [name] is a 69 y.o. female here for follow-up.  She was seen back in July to schedule screening colonoscopy.  At the time she complained of heartburn for a couple of years, on omeprazole.  Regurgitation several times per week.  Occasional solid food dysphagia, twice per month.  Upper endoscopy was offered but she declined.  Also offered barium esophagram for dysphagia but she declined.  She was switched from omeprazole to pantoprazole.  BM daily. Stools are pasty and hard to clean up after. No melena, brbpr.  No abdominal pain.  Heartburn well controlled.  Occasional regurgitation.  No significant swallowing concerns at this time.  No nausea or vomiting.  Weight is down about 10 pounds since we last saw her.  She has changed her diet, trying to avoid foods that trigger her reflux.  Current Outpatient Medications  Medication Sig Dispense Refill  . aspirin EC 81 MG tablet Take 81 mg by mouth daily.    Marland Kitchen atorvastatin (LIPITOR) 10 MG tablet Take 10 mg by mouth daily.    . blood glucose meter kit and supplies KIT Dispense based on patient and insurance preference. Use up to four times daily as directed. (FOR ICD-9 250.00, 250.01). 1 each 11  . buPROPion (WELLBUTRIN XL) 150 MG 24 hr tablet Take 1 tablet (150 mg total) by mouth daily. (Patient taking differently: Take 150 mg by mouth every evening. ) 90 tablet 3  . escitalopram (LEXAPRO) 20 MG tablet Take 1 tablet (20 mg total) by mouth daily. (Patient taking differently: Take 20 mg by mouth daily at 12 noon. ) 90 tablet 1  . gabapentin (NEURONTIN) 100 MG capsule Take 1-3 capsules (100-300 mg total) by mouth at bedtime. (Patient taking differently: Take 100 mg by mouth at bedtime. ) 90 capsule 3  . glucose blood test  strip Use as instructed 100 each 3  . levothyroxine (SYNTHROID) 100 MCG tablet TAKE 1 TABLET BY MOUTH ONCE DAILY BEFORE BREAKFAST (Patient taking differently: Take 100 mcg by mouth daily before breakfast. ) 90 tablet 3  . lisinopril (ZESTRIL) 2.5 MG tablet Take 1 tablet (2.5 mg total) by mouth daily. (Patient taking differently: Take 2.5 mg by mouth every evening. ) 90 tablet 1  . metFORMIN (GLUCOPHAGE) 1000 MG tablet Take 1 tablet (1,000 mg total) by mouth 2 (two) times daily with a meal. 180 tablet 3  . ONETOUCH DELICA LANCETS 13Y MISC Use to check blood sugars up to 4 times daily 400 each 3  . pantoprazole (PROTONIX) 40 MG tablet Take 1 tablet (40 mg total) by mouth daily before breakfast. 90 tablet 3  . Semaglutide,0.25 or 0.5MG/DOS, (OZEMPIC, 0.25 OR 0.5 MG/DOSE,) 2 MG/1.5ML SOPN Inject 0.375 mLs (0.5 mg total) into the skin once a week. (Patient taking differently: Inject 0.5 mg into the skin every Sunday at 6pm. ) 1.5 mL 3   No current facility-administered medications for this visit.    Allergies as of 05/03/2020 - Review Complete 05/03/2020  Allergen Reaction Noted  . Penicillins Other (See Comments) 12/08/2012  . Shrimp [shellfish allergy] Itching 12/08/2012  . Sulfa antibiotics Other (See Comments) 12/08/2012    ROS:  General: Negative for anorexia, unintentional weight loss, fever, chills, fatigue, weakness. ENT: Negative for hoarseness, difficulty swallowing ,  nasal congestion. CV: Negative for chest pain, angina, palpitations, dyspnea on exertion, peripheral edema.  Respiratory: Negative for dyspnea at rest, dyspnea on exertion, cough, sputum, wheezing.  GI: See history of present illness. GU:  Negative for dysuria, hematuria, urinary incontinence, urinary frequency, nocturnal urination.  Endo: Negative for unusual weight change.    Physical Examination:   BP (!) 151/75   Pulse 79   Temp (!) 96.9 F (36.1 C)   Ht '4\' 10"'  (1.473 m)   Wt 199 lb 12.8 oz (90.6 kg)   BMI  41.76 kg/m   General: Well-nourished, well-developed in no acute distress.  Eyes: No icterus. Mouth: masked Abdomen: Bowel sounds are normal, nontender, nondistended, no hepatosplenomegaly or masses, no abdominal bruits or hernia , no rebound or guarding.   Extremities: No lower extremity edema. No clubbing or deformities. Neuro: Alert and oriented x 4   Skin: Warm and dry, no jaundice.   Psych: Alert and cooperative, normal mood and affect.  Labs:  Lab Results  Component Value Date   CREATININE 0.75 02/16/2020   BUN 14 02/16/2020   NA 142 02/16/2020   K 5.0 02/16/2020   CL 103 02/16/2020   CO2 24 02/16/2020   Lab Results  Component Value Date   ALT 57 (H) 02/16/2020   AST 39 02/16/2020   ALKPHOS 88 02/16/2020   BILITOT 0.3 02/16/2020   Lab Results  Component Value Date   CREATININE 0.75 02/16/2020   BUN 14 02/16/2020   NA 142 02/16/2020   K 5.0 02/16/2020   CL 103 02/16/2020   CO2 24 02/16/2020   Lab Results  Component Value Date   WBC 8.3 11/01/2019   HGB 11.1 11/01/2019   HCT 34.7 11/01/2019   MCV 86 11/01/2019   PLT 256 11/01/2019     Imaging Studies: No results found.   Impression/plan:  GERD: Doing better on pantoprazole 40 mg daily.  Weight down 10 pounds since her last visit.  She is avoiding foods that trigger her reflux.  Occasional regurgitation and solid food dysphagia.  1-2 times per month.  Declines EGD or barium esophagram.  We will see her back in 1 year or sooner if needed.  History of adenomatous colon polyps: Colonoscopy completed August 2021, due for next colonoscopy in August 2024.

## 2020-05-16 ENCOUNTER — Ambulatory Visit (INDEPENDENT_AMBULATORY_CARE_PROVIDER_SITE_OTHER): Payer: Medicare HMO | Admitting: Family

## 2020-05-16 ENCOUNTER — Encounter: Payer: Self-pay | Admitting: Family

## 2020-05-16 ENCOUNTER — Other Ambulatory Visit: Payer: Self-pay

## 2020-05-16 VITALS — BP 135/63 | HR 68 | Temp 97.1°F | Ht <= 58 in | Wt 199.4 lb

## 2020-05-16 DIAGNOSIS — M159 Polyosteoarthritis, unspecified: Secondary | ICD-10-CM

## 2020-05-16 DIAGNOSIS — M8949 Other hypertrophic osteoarthropathy, multiple sites: Secondary | ICD-10-CM | POA: Diagnosis not present

## 2020-05-16 DIAGNOSIS — E1169 Type 2 diabetes mellitus with other specified complication: Secondary | ICD-10-CM

## 2020-05-16 DIAGNOSIS — K219 Gastro-esophageal reflux disease without esophagitis: Secondary | ICD-10-CM | POA: Diagnosis not present

## 2020-05-16 DIAGNOSIS — I10 Essential (primary) hypertension: Secondary | ICD-10-CM

## 2020-05-16 DIAGNOSIS — E785 Hyperlipidemia, unspecified: Secondary | ICD-10-CM | POA: Diagnosis not present

## 2020-05-16 DIAGNOSIS — R69 Illness, unspecified: Secondary | ICD-10-CM | POA: Diagnosis not present

## 2020-05-16 DIAGNOSIS — E039 Hypothyroidism, unspecified: Secondary | ICD-10-CM | POA: Diagnosis not present

## 2020-05-16 DIAGNOSIS — M150 Primary generalized (osteo)arthritis: Secondary | ICD-10-CM

## 2020-05-16 DIAGNOSIS — E1165 Type 2 diabetes mellitus with hyperglycemia: Secondary | ICD-10-CM

## 2020-05-16 DIAGNOSIS — F3341 Major depressive disorder, recurrent, in partial remission: Secondary | ICD-10-CM

## 2020-05-16 LAB — BAYER DCA HB A1C WAIVED: HB A1C (BAYER DCA - WAIVED): 7.1 % — ABNORMAL HIGH (ref <7.0)

## 2020-05-16 NOTE — Progress Notes (Signed)
Subjective:    Patient ID: [name], female    DOB: 30-Jul-1950, 69 y.o.   MRN: [id number]  Chief Complaint  Patient presents with  . Hypertension  . Diabetes   Pt presents to the office today for chronic follow up.  Hypertension This is a chronic problem. The current episode started more than 1 year ago. The problem has been resolved since onset. The problem is controlled. Pertinent negatives include no blurred vision, malaise/fatigue, peripheral edema or shortness of breath. Risk factors for coronary artery disease include dyslipidemia, diabetes mellitus, obesity and sedentary lifestyle. The current treatment provides moderate improvement. There is no history of CVA or heart failure. Identifiable causes of hypertension include a thyroid problem.  Diabetes She presents for her follow-up diabetic visit. She has type 2 diabetes mellitus. Her disease course has been stable. Pertinent negatives for hypoglycemia include no confusion or nervousness/anxiousness. Associated symptoms include fatigue. Pertinent negatives for diabetes include no blurred vision and no foot paresthesias. Symptoms are stable. Pertinent negatives for diabetic complications include no CVA or heart disease. Risk factors for coronary artery disease include dyslipidemia, diabetes mellitus, hypertension, sedentary lifestyle and post-menopausal. She is following a generally unhealthy diet. Her overall blood glucose range is 110-130 mg/dl. An ACE inhibitor/angiotensin II receptor blocker is being taken. Eye exam is current.  Gastroesophageal Reflux She complains of belching and heartburn. This is a chronic problem. The current episode started more than 1 year ago. The problem occurs occasionally. The problem has been waxing and waning. Associated symptoms include fatigue. Risk factors include obesity. She has tried a PPI for the symptoms. The treatment provided moderate relief.  Thyroid Problem Presents for follow-up visit.  Symptoms include fatigue. Patient reports no anxiety, depressed mood, diaphoresis or diarrhea. The symptoms have been stable. Her past medical history is significant for hyperlipidemia. There is no history of heart failure.  Hyperlipidemia This is a chronic problem. The current episode started more than 1 year ago. Pertinent negatives include no shortness of breath. Current antihyperlipidemic treatment includes statins. The current treatment provides moderate improvement of lipids. Risk factors for coronary artery disease include dyslipidemia, hypertension, a sedentary lifestyle and post-menopausal.  Arthritis Presents for follow-up visit. She complains of pain and stiffness. The symptoms have been stable. Affected locations include the left knee, right knee, left MCP and right MCP. Her pain is at a severity of 10/10. Associated symptoms include fatigue. Pertinent negatives include no diarrhea.  Depression        This is a chronic problem.  The current episode started more than 1 year ago.   The onset quality is gradual.   The problem occurs intermittently.  Associated symptoms include fatigue, restlessness and sad.  Associated symptoms include no helplessness and no hopelessness.  Past treatments include SSRIs - Selective serotonin reuptake inhibitors.  Past medical history includes thyroid problem.       Review of Systems  Constitutional: Positive for fatigue. Negative for diaphoresis and malaise/fatigue.  Eyes: Negative for blurred vision.  Respiratory: Negative for shortness of breath.   Gastrointestinal: Positive for heartburn. Negative for diarrhea.  Musculoskeletal: Positive for arthritis and stiffness.  Psychiatric/Behavioral: Positive for depression. Negative for confusion. The patient is not nervous/anxious.   All other systems reviewed and are negative.      Objective:   Physical Exam Vitals reviewed.  Constitutional:      General: She is not in acute distress.    Appearance:  She is well-developed and well-nourished. She is obese.  HENT:     Head: Normocephalic and atraumatic.     Right Ear: Tympanic membrane normal.     Left Ear: Tympanic membrane normal.     Mouth/Throat:     Mouth: Oropharynx is clear and moist.  Eyes:     Pupils: Pupils are equal, round, and reactive to light.  Neck:     Thyroid: No thyromegaly.  Cardiovascular:     Rate and Rhythm: Normal rate and regular rhythm.     Pulses: Intact distal pulses.     Heart sounds: Normal heart sounds. No murmur heard.   Pulmonary:     Effort: Pulmonary effort is normal. No respiratory distress.     Breath sounds: Normal breath sounds. No wheezing.  Abdominal:     General: Bowel sounds are normal. There is no distension.     Palpations: Abdomen is soft.     Tenderness: There is no abdominal tenderness.  Musculoskeletal:        General: No tenderness or edema. Normal range of motion.     Cervical back: Normal range of motion and neck supple.  Skin:    General: Skin is warm and dry.  Neurological:     Mental Status: She is alert and oriented to person, place, and time.     Cranial Nerves: No cranial nerve deficit.     Deep Tendon Reflexes: Reflexes are normal and symmetric.  Psychiatric:        Mood and Affect: Mood and affect normal.        Behavior: Behavior normal.        Thought Content: Thought content normal.        Judgment: Judgment normal.      Diabetic Foot Exam - Simple   Simple Foot Form Diabetic Foot exam was performed with the following findings: Yes 05/16/2020 10:27 AM  Visual Inspection No deformities, no ulcerations, no other skin breakdown bilaterally: Yes Sensation Testing Intact to touch and monofilament testing bilaterally: Yes Pulse Check Posterior Tibialis and Dorsalis pulse intact bilaterally: Yes Comments        BP 135/63   Pulse 68   Temp (!) 97.1 F (36.2 C) (Temporal)   Ht '4\' 10"'  (1.473 m)   Wt 199 lb 6.4 oz (90.4 kg)   SpO2 100%   BMI 41.67  kg/m   Assessment & Plan:  RUFINA KIMERY comes in today with chief complaint of Hypertension and Diabetes   Diagnosis and orders addressed:  1. Essential hypertension - CMP14+EGFR - CBC with Differential/Platelet  2. Gastroesophageal reflux disease, unspecified whether esophagitis present - CMP14+EGFR - CBC with Differential/Platelet  3. Type 2 diabetes mellitus with hyperglycemia, without long-term current use of insulin (HCC) - CMP14+EGFR - CBC with Differential/Platelet - Bayer DCA Hb A1c Waived  4. Hyperlipidemia associated with type 2 diabetes mellitus (HCC) - CMP14+EGFR - CBC with Differential/Platelet  5. Hypothyroidism, unspecified type - CMP14+EGFR - CBC with Differential/Platelet - TSH  6. Primary osteoarthritis involving multiple joints - CMP14+EGFR - CBC with Differential/Platelet  7. Recurrent major depressive disorder, in partial remission ([place]) - CMP14+EGFR - CBC with Differential/Platelet   Labs pending Health Maintenance reviewed Diet and exercise encouraged  Follow up plan: 3 months and needs appt with Clinical Pharm to help with cost of medicaitons   Evelina Dun, FNP

## 2020-05-16 NOTE — Patient Instructions (Signed)

## 2020-05-17 LAB — CBC WITH DIFFERENTIAL/PLATELET
Basophils Absolute: 0.1 10*3/uL (ref 0.0–0.2)
Basos: 1 %
EOS (ABSOLUTE): 0.2 10*3/uL (ref 0.0–0.4)
Eos: 3 %
Hematocrit: 35.7 % (ref 34.0–46.6)
Hemoglobin: 11.6 g/dL (ref 11.1–15.9)
Immature Grans (Abs): 0 10*3/uL (ref 0.0–0.1)
Immature Granulocytes: 1 %
Lymphocytes Absolute: 2 10*3/uL (ref 0.7–3.1)
Lymphs: 25 %
MCH: 28.8 pg (ref 26.6–33.0)
MCHC: 32.5 g/dL (ref 31.5–35.7)
MCV: 89 fL (ref 79–97)
Monocytes Absolute: 0.4 10*3/uL (ref 0.1–0.9)
Monocytes: 5 %
Neutrophils Absolute: 5.1 10*3/uL (ref 1.4–7.0)
Neutrophils: 65 %
Platelets: 265 10*3/uL (ref 150–450)
RBC: 4.03 x10E6/uL (ref 3.77–5.28)
RDW: 14.5 % (ref 11.7–15.4)
WBC: 7.9 10*3/uL (ref 3.4–10.8)

## 2020-05-17 LAB — CMP14+EGFR
ALT: 21 IU/L (ref 0–32)
AST: 20 IU/L (ref 0–40)
Albumin/Globulin Ratio: 1.9 (ref 1.2–2.2)
Albumin: 4.1 g/dL (ref 3.8–4.8)
Alkaline Phosphatase: 64 IU/L (ref 44–121)
BUN/Creatinine Ratio: 14 (ref 12–28)
BUN: 11 mg/dL (ref 8–27)
Bilirubin Total: 0.3 mg/dL (ref 0.0–1.2)
CO2: 23 mmol/L (ref 20–29)
Calcium: 9.6 mg/dL (ref 8.7–10.3)
Chloride: 101 mmol/L (ref 96–106)
Creatinine, Ser: 0.78 mg/dL (ref 0.57–1.00)
GFR calc Af Amer: 91 mL/min/{1.73_m2} (ref >59)
GFR calc non Af Amer: 79 mL/min/{1.73_m2} (ref >59)
Globulin, Total: 2.2 g/dL (ref 1.5–4.5)
Glucose: 167 mg/dL — ABNORMAL HIGH (ref 65–99)
Potassium: 5 mmol/L (ref 3.5–5.2)
Sodium: 138 mmol/L (ref 134–144)
Total Protein: 6.3 g/dL (ref 6.0–8.5)

## 2020-05-17 LAB — TSH: TSH: 0.11 u[IU]/mL — ABNORMAL LOW (ref 0.450–4.500)

## 2020-05-18 ENCOUNTER — Other Ambulatory Visit: Payer: Self-pay | Admitting: Family

## 2020-05-18 MED ORDER — LEVOTHYROXINE SODIUM 88 MCG PO TABS: 88.0000 ug | ORAL_TABLET | Freq: Every day | ORAL | 4 refills | Status: DC

## 2020-05-30 ENCOUNTER — Encounter: Payer: Self-pay | Admitting: Pharmacist

## 2020-05-30 ENCOUNTER — Other Ambulatory Visit: Payer: Self-pay

## 2020-05-30 ENCOUNTER — Ambulatory Visit (INDEPENDENT_AMBULATORY_CARE_PROVIDER_SITE_OTHER): Payer: Medicare HMO | Admitting: Pharmacist

## 2020-05-30 DIAGNOSIS — E119 Type 2 diabetes mellitus without complications: Secondary | ICD-10-CM | POA: Diagnosis not present

## 2020-05-30 NOTE — Progress Notes (Signed)
    05/30/2020 Name: [name] MRN: [id number] DOB: November 26, 1950   S:  26 yoF Presents for diabetes evaluation, education, and management Patient was referred and last seen by Primary Care Provider on 05/16/20.  Insurance coverage/medication affordability: [employer] medicare  Patient reports adherence with medications. . Current diabetes medications include: ozempic (has been without for several weeks due to cost), metformin . Current hypertension medications include: lisinoprol Goal 130/80 . Current hyperlipidemia medications include: atorvastatin  Patient denies hypoglycemic events.      O:  Lab Results  Component Value Date   HGBA1C 7.1 (H) 05/16/2020   Lipid Panel     Component Value Date/Time   CHOL 121 05/07/2019 1210   TRIG 100 05/07/2019 1210   HDL 60 05/07/2019 1210   CHOLHDL 2.0 05/07/2019 1210   LDLCALC 42 05/07/2019 1210     Home fasting blood sugars: 150-170 (has been without Ozempic)  2 hour post-meal/random blood sugars: n/a.   Clinical Atherosclerotic Cardiovascular Disease (ASCVD): No   The ASCVD Risk score Denman George DC Jr., et al., 2013) failed to calculate for the following reasons:   The valid total cholesterol range is 130 to 320 mg/dL    A/P:  Diabetes I9CV, patient reports higher FBG now that she is without Ozempic due to cost ($700 at pharmacy)  -Restart Ozempic 0.5mg  sq weekly  -patient denies history of thyroid cancer  -Enrolled patient in [employer]--application faxed and confirmed  -4 month supply to ship to PCP office by February 2022  -Continue [id number]  -Extensively discussed pathophysiology of diabetes, recommended lifestyle interventions, dietary effects on blood sugar control  -Counseled on s/sx of and management of hypoglycemia  -Next A1C anticipated 08/14/20.  Written patient instructions provided.  Total time in face to face counseling 25 minutes.   Follow up /PCP Clinic Visit ON 08/14/20.    Kieth Brightly,  PharmD, BCPS Clinical Pharmacist, [facility]  II Phone [phone]

## 2020-06-05 ENCOUNTER — Telehealth: Payer: Self-pay | Admitting: Pharmacist

## 2020-06-05 NOTE — Telephone Encounter (Signed)
[facility] [facility] PATIENT ASSISTANCE 2022-PATIENT ENROLLED  PATIENT TO RECEIVE OZEMPIC 4 MONTH SUPPLY FREE WILL SHIP TO PCP OFFICE IN 10-14 BUSINESS DAYS  PLEASE CALL PATIENT WHEN SHIPMENT HAS ARRIVED  REFILLS WILL HAVE TO BE SENT IN 4 MONTHS TO PATIENT ASSISTANCE COMPANY

## 2020-06-30 ENCOUNTER — Telehealth: Payer: Self-pay

## 2020-06-30 NOTE — Telephone Encounter (Signed)
Patient has not received email confirmation that order has shipped.  I explained to patient that once the order has shipped she will receive this and then it will be probably 2-1/2 weeks for [place] to receive shipment but once we do we will contact her to pick up.  Advised patient to save email because there will be a tracking number with it.

## 2020-06-30 NOTE — Telephone Encounter (Signed)
Pt called stating that she has not received a call from anyone regarding her Ozempic order.   Wants to know when order will be delivered to office. Was told that it shouldn't take more than 15 business days from order date, but says it has been more than 15 business days.  Please advise and call patient.

## 2020-07-16 ENCOUNTER — Other Ambulatory Visit: Payer: Self-pay | Admitting: Family

## 2020-07-16 DIAGNOSIS — F3341 Major depressive disorder, recurrent, in partial remission: Secondary | ICD-10-CM

## 2020-07-17 ENCOUNTER — Other Ambulatory Visit: Payer: Self-pay | Admitting: Family

## 2020-07-17 DIAGNOSIS — E7800 Pure hypercholesterolemia, unspecified: Secondary | ICD-10-CM

## 2020-07-17 DIAGNOSIS — F3341 Major depressive disorder, recurrent, in partial remission: Secondary | ICD-10-CM

## 2020-07-17 MED ORDER — ATORVASTATIN CALCIUM 10 MG PO TABS: 10.0000 mg | ORAL_TABLET | Freq: Every day | ORAL | 0 refills | Status: DC

## 2020-07-17 NOTE — Addendum Note (Signed)
Addended by: Antonietta Barcelona D on: 07/17/2020 03:22 PM   Modules accepted: Orders

## 2020-07-31 ENCOUNTER — Other Ambulatory Visit: Payer: Self-pay | Admitting: *Deleted

## 2020-07-31 DIAGNOSIS — E119 Type 2 diabetes mellitus without complications: Secondary | ICD-10-CM

## 2020-07-31 MED ORDER — METFORMIN HCL 1000 MG PO TABS: 1000.0000 mg | ORAL_TABLET | Freq: Two times a day (BID) | ORAL | 0 refills | Status: DC

## 2020-08-03 ENCOUNTER — Encounter: Payer: Self-pay | Admitting: *Deleted

## 2020-08-03 NOTE — Progress Notes (Signed)
Per pt assistance paperwork - she was supposed to get #4 mo supply of ozempic late Jan / early FEB.  This was not documented that it came in ( if it did)  We gave pt #5 pens yesterday to replace the shipment.

## 2020-08-09 ENCOUNTER — Other Ambulatory Visit: Payer: Self-pay | Admitting: Family

## 2020-08-09 DIAGNOSIS — I959 Hypotension, unspecified: Secondary | ICD-10-CM

## 2020-08-09 DIAGNOSIS — E1165 Type 2 diabetes mellitus with hyperglycemia: Secondary | ICD-10-CM

## 2020-08-14 ENCOUNTER — Ambulatory Visit (INDEPENDENT_AMBULATORY_CARE_PROVIDER_SITE_OTHER): Payer: Medicare HMO | Admitting: Family

## 2020-08-14 ENCOUNTER — Other Ambulatory Visit: Payer: Self-pay

## 2020-08-14 ENCOUNTER — Encounter: Payer: Self-pay | Admitting: Family

## 2020-08-14 VITALS — BP 148/66 | HR 53 | Temp 98.3°F | Ht <= 58 in | Wt 207.4 lb

## 2020-08-14 DIAGNOSIS — E785 Hyperlipidemia, unspecified: Secondary | ICD-10-CM

## 2020-08-14 DIAGNOSIS — R42 Dizziness and giddiness: Secondary | ICD-10-CM

## 2020-08-14 DIAGNOSIS — M8949 Other hypertrophic osteoarthropathy, multiple sites: Secondary | ICD-10-CM

## 2020-08-14 DIAGNOSIS — E119 Type 2 diabetes mellitus without complications: Secondary | ICD-10-CM | POA: Diagnosis not present

## 2020-08-14 DIAGNOSIS — E039 Hypothyroidism, unspecified: Secondary | ICD-10-CM

## 2020-08-14 DIAGNOSIS — E1169 Type 2 diabetes mellitus with other specified complication: Secondary | ICD-10-CM

## 2020-08-14 DIAGNOSIS — E7800 Pure hypercholesterolemia, unspecified: Secondary | ICD-10-CM | POA: Diagnosis not present

## 2020-08-14 DIAGNOSIS — I959 Hypotension, unspecified: Secondary | ICD-10-CM

## 2020-08-14 DIAGNOSIS — I10 Essential (primary) hypertension: Secondary | ICD-10-CM

## 2020-08-14 DIAGNOSIS — F3341 Major depressive disorder, recurrent, in partial remission: Secondary | ICD-10-CM

## 2020-08-14 DIAGNOSIS — E1165 Type 2 diabetes mellitus with hyperglycemia: Secondary | ICD-10-CM

## 2020-08-14 DIAGNOSIS — R69 Illness, unspecified: Secondary | ICD-10-CM | POA: Diagnosis not present

## 2020-08-14 DIAGNOSIS — K219 Gastro-esophageal reflux disease without esophagitis: Secondary | ICD-10-CM | POA: Diagnosis not present

## 2020-08-14 DIAGNOSIS — M159 Polyosteoarthritis, unspecified: Secondary | ICD-10-CM

## 2020-08-14 LAB — BAYER DCA HB A1C WAIVED: HB A1C (BAYER DCA - WAIVED): 8.3 % — ABNORMAL HIGH (ref <7.0)

## 2020-08-14 MED ORDER — PANTOPRAZOLE SODIUM 40 MG PO TBEC: 40.0000 mg | DELAYED_RELEASE_TABLET | Freq: Every day | ORAL | 3 refills | Status: DC

## 2020-08-14 MED ORDER — ATORVASTATIN CALCIUM 10 MG PO TABS: 10.0000 mg | ORAL_TABLET | Freq: Every day | ORAL | 0 refills | Status: DC

## 2020-08-14 MED ORDER — ESCITALOPRAM OXALATE 20 MG PO TABS: 20.0000 mg | ORAL_TABLET | Freq: Every day | ORAL | 1 refills | Status: DC

## 2020-08-14 MED ORDER — OZEMPIC (0.25 OR 0.5 MG/DOSE) 2 MG/1.5ML ~~LOC~~ SOPN: 0.5000 mg | PEN_INJECTOR | SUBCUTANEOUS | 2 refills | Status: DC

## 2020-08-14 MED ORDER — LEVOTHYROXINE SODIUM 88 MCG PO TABS: 88.0000 ug | ORAL_TABLET | Freq: Every day | ORAL | 4 refills | Status: DC

## 2020-08-14 MED ORDER — BUPROPION HCL ER (XL) 150 MG PO TB24: 150.0000 mg | ORAL_TABLET | Freq: Every day | ORAL | 3 refills | Status: DC

## 2020-08-14 MED ORDER — METFORMIN HCL 1000 MG PO TABS: 1000.0000 mg | ORAL_TABLET | Freq: Two times a day (BID) | ORAL | 0 refills | Status: DC

## 2020-08-14 MED ORDER — LISINOPRIL 2.5 MG PO TABS: 2.5000 mg | ORAL_TABLET | Freq: Every day | ORAL | 1 refills | Status: DC

## 2020-08-14 NOTE — Patient Instructions (Signed)
Diabetes Mellitus and Nutrition, Adult When you have diabetes, or diabetes mellitus, it is very important to have healthy eating habits because your blood sugar (glucose) levels are greatly affected by what you eat and drink. Eating healthy foods in the right amounts, at about the same times every day, can help you:  Control your blood glucose.  Lower your risk of heart disease.  Improve your blood pressure.  Reach or maintain a healthy weight. What can affect my meal plan? Every person with diabetes is different, and each person has different needs for a meal plan. Your health care provider may recommend that you work with a dietitian to make a meal plan that is best for you. Your meal plan may vary depending on factors such as:  The calories you need.  The medicines you take.  Your weight.  Your blood glucose, blood pressure, and cholesterol levels.  Your activity level.  Other health conditions you have, such as heart or kidney disease. How do carbohydrates affect me? Carbohydrates, also called carbs, affect your blood glucose level more than any other type of food. Eating carbs naturally raises the amount of glucose in your blood. Carb counting is a method for keeping track of how many carbs you eat. Counting carbs is important to keep your blood glucose at a healthy level, especially if you use insulin or take certain oral diabetes medicines. It is important to know how many carbs you can safely have in each meal. This is different for every person. Your dietitian can help you calculate how many carbs you should have at each meal and for each snack. How does alcohol affect me? Alcohol can cause a sudden decrease in blood glucose (hypoglycemia), especially if you use insulin or take certain oral diabetes medicines. Hypoglycemia can be a life-threatening condition. Symptoms of hypoglycemia, such as sleepiness, dizziness, and confusion, are similar to symptoms of having too much  alcohol.  Do not drink alcohol if: ? Your health care provider tells you not to drink. ? You are pregnant, may be pregnant, or are planning to become pregnant.  If you drink alcohol: ? Do not drink on an empty stomach. ? Limit how much you use to:  0-1 drink a day for women.  0-2 drinks a day for men. ? Be aware of how much alcohol is in your drink. In the [place] one drink equals one 12 oz bottle of beer (355 mL), one 5 oz glass of wine (148 mL), or one 1 oz glass of hard liquor (44 mL). ? Keep yourself hydrated with water, diet soda, or unsweetened iced tea.  Keep in mind that regular soda, juice, and other mixers may contain a lot of sugar and must be counted as carbs. What are tips for following this plan? Reading food labels  Start by checking the serving size on the "Nutrition Facts" label of packaged foods and drinks. The amount of calories, carbs, fats, and other nutrients listed on the label is based on one serving of the item. Many items contain more than one serving per package.  Check the total grams (g) of carbs in one serving. You can calculate the number of servings of carbs in one serving by dividing the total carbs by 15. For example, if a food has 30 g of total carbs per serving, it would be equal to 2 servings of carbs.  Check the number of grams (g) of saturated fats and trans fats in one serving. Choose foods that have   a low amount or none of these fats.  Check the number of milligrams (mg) of salt (sodium) in one serving. Most people should limit total sodium intake to less than 2,300 mg per day.  Always check the nutrition information of foods labeled as "low-fat" or "nonfat." These foods may be higher in added sugar or refined carbs and should be avoided.  Talk to your dietitian to identify your daily goals for nutrients listed on the label. Shopping  Avoid buying canned, pre-made, or processed foods. These foods tend to be high in fat, sodium, and added  sugar.  Shop around the outside edge of the grocery store. This is where you will most often find fresh fruits and vegetables, bulk grains, fresh meats, and fresh dairy. Cooking  Use low-heat cooking methods, such as baking, instead of high-heat cooking methods like deep frying.  Cook using healthy oils, such as olive, canola, or sunflower oil.  Avoid cooking with butter, cream, or high-fat meats. Meal planning  Eat meals and snacks regularly, preferably at the same times every day. Avoid going long periods of time without eating.  Eat foods that are high in fiber, such as fresh fruits, vegetables, beans, and whole grains. Talk with your dietitian about how many servings of carbs you can eat at each meal.  Eat 4-6 oz (112-168 g) of lean protein each day, such as lean meat, chicken, fish, eggs, or tofu. One ounce (oz) of lean protein is equal to: ? 1 oz (28 g) of meat, chicken, or fish. ? 1 egg. ?  cup (62 g) of tofu.  Eat some foods each day that contain healthy fats, such as avocado, nuts, seeds, and fish.   What foods should I eat? Fruits Berries. Apples. Oranges. Peaches. Apricots. Plums. Grapes. Mango. Papaya. Pomegranate. Kiwi. Cherries. Vegetables Lettuce. Spinach. Leafy greens, including kale, chard, collard greens, and mustard greens. Beets. Cauliflower. Cabbage. Broccoli. Carrots. Green beans. Tomatoes. Peppers. Onions. Cucumbers. Brussels sprouts. Grains Whole grains, such as whole-wheat or whole-grain bread, crackers, tortillas, cereal, and pasta. Unsweetened oatmeal. Quinoa. Brown or wild rice. Meats and other proteins Seafood. Poultry without skin. Lean cuts of poultry and beef. Tofu. Nuts. Seeds. Dairy Low-fat or fat-free dairy products such as milk, yogurt, and cheese. The items listed above may not be a complete list of foods and beverages you can eat. Contact a dietitian for more information. What foods should I avoid? Fruits Fruits canned with  syrup. Vegetables Canned vegetables. Frozen vegetables with butter or cream sauce. Grains Refined white flour and flour products such as bread, pasta, snack foods, and cereals. Avoid all processed foods. Meats and other proteins Fatty cuts of meat. Poultry with skin. Breaded or fried meats. Processed meat. Avoid saturated fats. Dairy Full-fat yogurt, cheese, or milk. Beverages Sweetened drinks, such as soda or iced tea. The items listed above may not be a complete list of foods and beverages you should avoid. Contact a dietitian for more information. Questions to ask a health care provider  Do I need to meet with a diabetes educator?  Do I need to meet with a dietitian?  What number can I call if I have questions?  When are the best times to check my blood glucose? Where to find more information:  American Diabetes Association: diabetes.org  Academy of Nutrition and Dietetics: www.eatright.org  National Institute of Diabetes and Digestive and Kidney Diseases: www.niddk.nih.gov  Association of Diabetes Care and Education Specialists: www.diabeteseducator.org Summary  It is important to have healthy eating   habits because your blood sugar (glucose) levels are greatly affected by what you eat and drink.  A healthy meal plan will help you control your blood glucose and maintain a healthy lifestyle.  Your health care provider may recommend that you work with a dietitian to make a meal plan that is best for you.  Keep in mind that carbohydrates (carbs) and alcohol have immediate effects on your blood glucose levels. It is important to count carbs and to use alcohol carefully. This information is not intended to replace advice given to you by your health care provider. Make sure you discuss any questions you have with your health care provider. Document Revised: 04/20/2019 Document Reviewed: 04/20/2019 Elsevier Patient Education  2021 Elsevier Inc.  

## 2020-08-14 NOTE — Progress Notes (Signed)
Subjective:    Patient ID: [name], female    DOB: February 10, 1951, 70 y.o.   MRN: [id number]  Chief Complaint  Patient presents with  . Diabetes  . Hypertension   Pt presents to the office today for chronic follow up. Diabetes She presents for her follow-up diabetic visit. She has type 2 diabetes mellitus. Her disease course has been stable. There are no hypoglycemic associated symptoms. Associated symptoms include blurred vision. Pertinent negatives for diabetes include no fatigue. Symptoms are stable. There are no diabetic complications. Risk factors for coronary artery disease include dyslipidemia, diabetes mellitus, hypertension and sedentary lifestyle. She is following a generally unhealthy diet. Her overall blood glucose range is 140-180 mg/dl. An ACE inhibitor/angiotensin II receptor blocker is being taken. Eye exam is current.  Hypertension This is a chronic problem. The current episode started more than 1 year ago. The problem has been waxing and waning since onset. Associated symptoms include blurred vision. Pertinent negatives include no malaise/fatigue, peripheral edema or shortness of breath. Risk factors for coronary artery disease include dyslipidemia, obesity and sedentary lifestyle. The current treatment provides moderate improvement. There is no history of heart failure. Identifiable causes of hypertension include a thyroid problem.  Gastroesophageal Reflux She complains of belching and heartburn. This is a chronic problem. The current episode started more than 1 year ago. The problem occurs occasionally. The problem has been waxing and waning. Pertinent negatives include no fatigue. She has tried a PPI for the symptoms. The treatment provided moderate relief.  Thyroid Problem Presents for follow-up visit. Symptoms include dry skin. Patient reports no constipation or fatigue. The symptoms have been stable. Her past medical history is significant for hyperlipidemia. There is  no history of heart failure.  Hyperlipidemia This is a chronic problem. The current episode started more than 1 year ago. The problem is controlled. Pertinent negatives include no shortness of breath. Current antihyperlipidemic treatment includes statins. The current treatment provides moderate improvement of lipids. Risk factors for coronary artery disease include diabetes mellitus, hypertension, a sedentary lifestyle and post-menopausal.  Arthritis Presents for follow-up visit. She complains of pain and stiffness. The symptoms have been stable. Affected locations include the left MCP and right MCP. Her pain is at a severity of 4/10. Pertinent negatives include no fatigue.  Depression        This is a chronic problem.  The current episode started more than 1 year ago.   The onset quality is gradual.   The problem occurs intermittently.  Associated symptoms include no fatigue, no helplessness and no hopelessness.  Past treatments include SSRIs - Selective serotonin reuptake inhibitors.  Past medical history includes thyroid problem.       Review of Systems  Constitutional: Negative for fatigue and malaise/fatigue.  Eyes: Positive for blurred vision.  Respiratory: Negative for shortness of breath.   Gastrointestinal: Positive for heartburn. Negative for constipation.  Musculoskeletal: Positive for arthritis and stiffness.  Psychiatric/Behavioral: Positive for depression.  All other systems reviewed and are negative.      Objective:   Physical Exam Vitals reviewed.  Constitutional:      General: She is not in acute distress.    Appearance: She is well-developed. She is obese.  HENT:     Head: Normocephalic and atraumatic.     Right Ear: External ear normal.  Eyes:     Pupils: Pupils are equal, round, and reactive to light.  Neck:     Thyroid: No thyromegaly.  Cardiovascular:  Rate and Rhythm: Normal rate and regular rhythm.     Heart sounds: Normal heart sounds. No murmur  heard.   Pulmonary:     Effort: Pulmonary effort is normal. No respiratory distress.     Breath sounds: Normal breath sounds. No wheezing.  Abdominal:     General: Bowel sounds are normal. There is no distension.     Palpations: Abdomen is soft.     Tenderness: There is no abdominal tenderness.  Musculoskeletal:        General: No tenderness. Normal range of motion.     Cervical back: Normal range of motion and neck supple.  Skin:    General: Skin is warm and dry.  Neurological:     Mental Status: She is alert and oriented to person, place, and time.     Cranial Nerves: No cranial nerve deficit.     Deep Tendon Reflexes: Reflexes are normal and symmetric.  Psychiatric:        Behavior: Behavior normal.        Thought Content: Thought content normal.        Judgment: Judgment normal.       BP (!) 148/66   Pulse (!) 53   Temp 98.3 F (36.8 C) (Temporal)   Ht '4\' 10"'  (1.473 m)   Wt 207 lb 6.4 oz (94.1 kg)   BMI 43.35 kg/m      Assessment & Plan:  [name] comes in today with chief complaint of Diabetes and Hypertension   Diagnosis and orders addressed:  1. Elevated cholesterol - atorvastatin (LIPITOR) 10 MG tablet; Take 1 tablet (10 mg total) by mouth daily.  Dispense: 90 tablet; Refill: 0 - CMP14+EGFR - CBC with Differential/Platelet  2. Hypotension, unspecified hypotension type - lisinopril (ZESTRIL) 2.5 MG tablet; Take 1 tablet (2.5 mg total) by mouth daily.  Dispense: 90 tablet; Refill: 1 - CMP14+EGFR - CBC with Differential/Platelet  3. Type 2 diabetes mellitus with hyperglycemia, without long-term current use of insulin (HCC) - lisinopril (ZESTRIL) 2.5 MG tablet; Take 1 tablet (2.5 mg total) by mouth daily.  Dispense: 90 tablet; Refill: 1 - Bayer DCA Hb A1c Waived - CMP14+EGFR - CBC with Differential/Platelet  4. Recurrent major depressive disorder, in partial remission (HCC) - escitalopram (LEXAPRO) 20 MG tablet; Take 1 tablet (20 mg total) by  mouth daily.  Dispense: 90 tablet; Refill: 1 - buPROPion (WELLBUTRIN XL) 150 MG 24 hr tablet; Take 1 tablet (150 mg total) by mouth daily.  Dispense: 90 tablet; Refill: 3 - CMP14+EGFR - CBC with Differential/Platelet  5. Type 2 diabetes mellitus without complication, without long-term current use of insulin (HCC) - metFORMIN (GLUCOPHAGE) 1000 MG tablet; Take 1 tablet (1,000 mg total) by mouth 2 (two) times daily with a meal.  Dispense: 180 tablet; Refill: 0 - Semaglutide,0.25 or 0.5MG/DOS, (OZEMPIC, 0.25 OR 0.5 MG/DOSE,) 2 MG/1.5ML SOPN; Inject 0.5 mg into the skin every Sunday at 6pm.  Dispense: 3 mL; Refill: 2 - CMP14+EGFR - CBC with Differential/Platelet - TSH  6. Hypothyroidism, unspecified type  - levothyroxine (SYNTHROID) 88 MCG tablet; Take 1 tablet (88 mcg total) by mouth daily.  Dispense: 90 tablet; Refill: 4 - CMP14+EGFR - CBC with Differential/Platelet  7. Hyperlipidemia associated with type 2 diabetes mellitus (HCC) - CMP14+EGFR - CBC with Differential/Platelet  8. Gastroesophageal reflux disease, unspecified whether esophagitis present - CMP14+EGFR - CBC with Differential/Platelet  9. Essential hypertension - CMP14+EGFR - CBC with Differential/Platelet  10. Dizziness and giddiness - CMP14+EGFR - CBC  with Differential/Platelet  11. Primary osteoarthritis involving multiple joints - CMP14+EGFR - CBC with Differential/Platelet   Labs pending Health Maintenance reviewed Diet and exercise encouraged  Follow up plan: 3 months    Evelina Dun, FNP

## 2020-08-15 ENCOUNTER — Other Ambulatory Visit: Payer: Self-pay | Admitting: Family

## 2020-08-15 LAB — CMP14+EGFR
ALT: 17 IU/L (ref 0–32)
AST: 20 IU/L (ref 0–40)
Albumin/Globulin Ratio: 2 (ref 1.2–2.2)
Albumin: 4.1 g/dL (ref 3.8–4.8)
Alkaline Phosphatase: 69 IU/L (ref 44–121)
BUN/Creatinine Ratio: 14 (ref 12–28)
BUN: 10 mg/dL (ref 8–27)
Bilirubin Total: 0.3 mg/dL (ref 0.0–1.2)
CO2: 22 mmol/L (ref 20–29)
Calcium: 9.6 mg/dL (ref 8.7–10.3)
Chloride: 102 mmol/L (ref 96–106)
Creatinine, Ser: 0.73 mg/dL (ref 0.57–1.00)
Globulin, Total: 2.1 g/dL (ref 1.5–4.5)
Glucose: 157 mg/dL — ABNORMAL HIGH (ref 65–99)
Potassium: 5.2 mmol/L (ref 3.5–5.2)
Sodium: 141 mmol/L (ref 134–144)
Total Protein: 6.2 g/dL (ref 6.0–8.5)
eGFR: 90 mL/min/{1.73_m2} (ref >59)

## 2020-08-15 LAB — CBC WITH DIFFERENTIAL/PLATELET
Basophils Absolute: 0.1 10*3/uL (ref 0.0–0.2)
Basos: 1 %
EOS (ABSOLUTE): 0.5 10*3/uL — ABNORMAL HIGH (ref 0.0–0.4)
Eos: 6 %
Hematocrit: 36.7 % (ref 34.0–46.6)
Hemoglobin: 11.4 g/dL (ref 11.1–15.9)
Immature Grans (Abs): 0.1 10*3/uL (ref 0.0–0.1)
Immature Granulocytes: 1 %
Lymphocytes Absolute: 2.6 10*3/uL (ref 0.7–3.1)
Lymphs: 29 %
MCH: 26.2 pg — ABNORMAL LOW (ref 26.6–33.0)
MCHC: 31.1 g/dL — ABNORMAL LOW (ref 31.5–35.7)
MCV: 84 fL (ref 79–97)
Monocytes Absolute: 0.6 10*3/uL (ref 0.1–0.9)
Monocytes: 6 %
Neutrophils Absolute: 5.2 10*3/uL (ref 1.4–7.0)
Neutrophils: 57 %
Platelets: 268 10*3/uL (ref 150–450)
RBC: 4.35 x10E6/uL (ref 3.77–5.28)
RDW: 14.6 % (ref 11.7–15.4)
WBC: 9 10*3/uL (ref 3.4–10.8)

## 2020-08-15 LAB — TSH: TSH: 0.281 u[IU]/mL — ABNORMAL LOW (ref 0.450–4.500)

## 2020-09-04 ENCOUNTER — Telehealth: Payer: Self-pay | Admitting: *Deleted

## 2020-09-04 NOTE — Telephone Encounter (Signed)
Pt aware pt assistance medication of Ozempic came in

## 2020-10-11 ENCOUNTER — Ambulatory Visit (INDEPENDENT_AMBULATORY_CARE_PROVIDER_SITE_OTHER): Payer: Medicare HMO

## 2020-10-11 VITALS — Ht <= 58 in | Wt 207.0 lb

## 2020-10-11 DIAGNOSIS — Z0000 Encounter for general adult medical examination without abnormal findings: Secondary | ICD-10-CM

## 2020-10-11 NOTE — Progress Notes (Signed)
Subjective:   JILL STOPKA is a 70 y.o. female who presents for an Initial Medicare Annual Wellness Visit.  Virtual Visit via Telephone Note  I connected with  [name] on 10/11/20 at  2:00 PM EDT by telephone and verified that I am speaking with the correct person using two identifiers.  Location: Patient: Home Provider: [facility] Persons participating in the virtual visit: patient/Nurse Health Advisor   I discussed the limitations, risks, security and privacy concerns of performing an evaluation and management service by telephone and the availability of in person appointments. The patient expressed understanding and agreed to proceed.  Interactive audio and video telecommunications were attempted between this nurse and patient, however failed, due to patient having technical difficulties OR patient did not have access to video capability.  We continued and completed visit with audio only.  Some vital signs may be absent or patient reported.   Erum Cercone E Nala Kachel, LPN   Review of Systems     Cardiac Risk Factors include: advanced age (>2mn, >>22women);diabetes mellitus;obesity (BMI >30kg/m2);sedentary lifestyle;dyslipidemia;hypertension     Objective:    Today's Vitals   10/11/20 1403  Weight: 207 lb (93.9 kg)  Height: '4\' 10"'  (1.473 m)   Body mass index is 43.26 kg/m.  Advanced Directives 10/11/2020 01/25/2020 01/24/2020 05/10/2019 12/16/2018 12/13/2018 06/29/2015  Does Patient Have a Medical Advance Directive? No No No No No No No  Would patient like information on creating a medical advance directive? Yes (MAU/Ambulatory/Procedural Areas - Information given) No - Patient declined No - Patient declined - - - -  Pre-existing out of facility DNR order (yellow form or pink MOST form) - - - - - - -    Current Medications (verified) Outpatient Encounter Medications as of 10/11/2020  Medication Sig  . aspirin EC 81 MG tablet Take 81 mg by mouth daily.  .Marland Kitchenatorvastatin  (LIPITOR) 10 MG tablet Take 1 tablet (10 mg total) by mouth daily.  . blood glucose meter kit and supplies KIT Dispense based on patient and insurance preference. Use up to four times daily as directed. (FOR ICD-9 250.00, 250.01).  .Marland KitchenbuPROPion (WELLBUTRIN XL) 150 MG 24 hr tablet Take 1 tablet (150 mg total) by mouth daily.  .Marland Kitchenescitalopram (LEXAPRO) 20 MG tablet Take 1 tablet (20 mg total) by mouth daily.  .Marland Kitchengabapentin (NEURONTIN) 100 MG capsule Take 1-3 capsules (100-300 mg total) by mouth at bedtime.  .Marland Kitchenglucose blood test strip Use as instructed  . levothyroxine (SYNTHROID) 88 MCG tablet Take 1 tablet (88 mcg total) by mouth daily.  .Marland Kitchenlisinopril (ZESTRIL) 2.5 MG tablet Take 1 tablet (2.5 mg total) by mouth daily.  . metFORMIN (GLUCOPHAGE) 1000 MG tablet Take 1 tablet (1,000 mg total) by mouth 2 (two) times daily with a meal.  . ONETOUCH DELICA LANCETS 337TMISC Use to check blood sugars up to 4 times daily  . pantoprazole (PROTONIX) 40 MG tablet Take 1 tablet (40 mg total) by mouth daily before breakfast.  . Semaglutide,0.25 or 0.5MG/DOS, (OZEMPIC, 0.25 OR 0.5 MG/DOSE,) 2 MG/1.5ML SOPN Inject 0.5 mg into the skin every Sunday at 6pm.   No facility-administered encounter medications on file as of 10/11/2020.    Allergies (verified) Penicillins, Shrimp [shellfish allergy], and Sulfa antibiotics   History: Past Medical History:  Diagnosis Date  . Arthritis   . Depression    ONGOING PROBLEM  . Diabetes mellitus without complication ([place]    DX 2008   USES PILLS  .  Dizziness and giddiness 06/29/2015  . GERD (gastroesophageal reflux disease)   . Hypertension   . Hypothyroidism   . Morbidly obese ([place])   . Morton's neuroma of left foot    Past Surgical History:  Procedure Laterality Date  . CARPAL TUNNEL RELEASE     LEFT  . CARPAL TUNNEL RELEASE Right 12/22/2012   Procedure: CARPAL TUNNEL RELEASE- RIGHT;  Surgeon: Meredith Pel, MD;  Location: [place];  Service: Orthopedics;   Laterality: Right;  . COLONOSCOPY WITH PROPOFOL N/A 01/25/2020   [place]: 6 polyps removed, tubular adenomas.  Next colonoscopy in 3 years.  Marland Kitchen POLYPECTOMY  01/25/2020   Procedure: POLYPECTOMY;  Surgeon: Daneil Dolin, MD;  [facility];  Service: Endoscopy;;  . SHOULDER ARTHROSCOPY W/ ROTATOR CUFF REPAIR     LEFT  . SHOULDER ARTHROSCOPY WITH ROTATOR CUFF REPAIR Right 12/22/2012   Procedure: SHOULDER ARTHROSCOPY WITH ROTATOR CUFF REPAIR AND BICEPS TENODESIS REPAIR;  Surgeon: Meredith Pel, MD;  Location: [facility];  Service: Orthopedics;  Laterality: Right;  Right Carpal Tunnel Release, Right Shoulder Arthroscopy, Rotator Cuff Tear Repair, Biceps Tenodesis  . TOE SURGERY     LEFT BIG TOE  . TUBAL LIGATION     Family History  Problem Relation Age of Onset  . Heart attack Mother   . Cancer Father        brain  . Heart failure Father   . Cancer Brother        Prostate  . Leukemia Brother   . Dementia Sister        alzheimers  . Heart disease Sister   . Diabetes Sister   . Hypertension Sister   . Hyperlipidemia Sister   . Heart failure Sister   . Colon cancer Cousin        61   Social History   Socioeconomic History  . Marital status: Widowed    Spouse name: Not on file  . Number of children: 1  . Years of education: 71  . Highest education level: Not on file  Occupational History  . Occupation: disability  Tobacco Use  . Smoking status: Never Smoker  . Smokeless tobacco: Never Used  Vaping Use  . Vaping Use: Never used  Substance and Sexual Activity  . Alcohol use: No  . Drug use: No  . Sexual activity: Not on file  Other Topics Concern  . Not on file  Social History Narrative   Patient drinks 1 soda daily.   Patient is left handed.    Daughter lives with her   Social Determinants of Health   Financial Resource Strain: Low Risk   . Difficulty of Paying Living Expenses: Not very hard  Food Insecurity: No Food Insecurity  . Worried About Ship broker in the Last Year: Never true  . Ran Out of Food in the Last Year: Never true  Transportation Needs: No Transportation Needs  . Lack of Transportation (Medical): No  . Lack of Transportation (Non-Medical): No  Physical Activity: Inactive  . Days of Exercise per Week: 0 days  . Minutes of Exercise per Session: 0 min  Stress: No Stress Concern Present  . Feeling of Stress : Not at all  Social Connections: Moderately Integrated  . Frequency of Communication with Friends and Family: More than three times a week  . Frequency of Social Gatherings with Friends and Family: More than three times a week  . Attends Religious Services: 1 to 4 times per  year  . Active Member of Clubs or Organizations: No  . Attends Archivist Meetings: 1 to 4 times per year  . Marital Status: Widowed    Tobacco Counseling Counseling given: Not Answered   Clinical Intake:  Pre-visit preparation completed: Yes  Pain : No/denies pain     BMI - recorded: 43.26 Nutritional Status: BMI > 30  Obese Nutritional Risks: None Diabetes: Yes CBG done?: No Did pt. bring in CBG monitor from home?: No  How often do you need to have someone help you when you read instructions, pamphlets, or other written materials from your doctor or pharmacy?: 1 - Never  Nutrition Risk Assessment:  Has the patient had any N/V/D within the last 2 months?  No  Does the patient have any non-healing wounds?  No  Has the patient had any unintentional weight loss or weight gain?  No   Diabetes:  Is the patient diabetic?  Yes  If diabetic, was a CBG obtained today?  No  Did the patient bring in their glucometer from home?  No  How often do you monitor your CBG's? Used to check daily - hasn't been checking lately .   Financial Strains and Diabetes Management:  Are you having any financial strains with the device, your supplies or your medication? No .  Does the patient want to be seen by Chronic Care Management  for management of their diabetes?  No  Would the patient like to be referred to a Nutritionist or for Diabetic Management?  No   Diabetic Exams:  Diabetic Eye Exam: Completed . Overdue for diabetic eye exam. Pt has been advised about the importance in completing this exam. A referral has been placed today. Message sent to referral coordinator for scheduling purposes. Advised pt to expect a call from office referred to regarding appt.  Diabetic Foot Exam: Completed 05/16/2020. Pt has been advised about the importance in completing this exam. Pt is scheduled for diabetic foot exam on 11/16/20.    Interpreter Needed?: No  Information entered by :: Lutisha Knoche, LPN   Activities of Daily Living In your present state of health, do you have any difficulty performing the following activities: 10/11/2020 01/24/2020  Hearing? Y N  Vision? N N  Difficulty concentrating or making decisions? Y N  Walking or climbing stairs? N N  Dressing or bathing? N N  Doing errands, shopping? N N  Preparing Food and eating ? N -  Using the Toilet? N -  In the past six months, have you accidently leaked urine? Y -  Comment wears pads for protection -  Do you have problems with loss of bowel control? N -  Managing your Medications? N -  Managing your Finances? N -  Housekeeping or managing your Housekeeping? N -  Some recent data might be hidden    Patient Care Team: Sharion Balloon, FNP as PCP - General (Family Medicine) Lavera Guise, [facility] (Pharmacist) Gala Romney Cristopher Estimable, MD as Consulting Physician (Gastroenterology)  Indicate any recent Medical Services you may have received from other than [facility] providers in the past year (date may be approximate).     Assessment:   This is a routine wellness examination for Ruben.  Hearing/Vision screen  Hearing Screening   '125Hz'  '250Hz'  '500Hz'  '1000Hz'  '2000Hz'  '3000Hz'  '4000Hz'  '6000Hz'  '8000Hz'   Right ear:           Left ear:           Comments: C/o mild hearing  loss -  declines hearing aids  Vision Screening Comments: Wears eyeglasses - Annual visits with [facility] - up to date with eye exam  Dietary issues and exercise activities discussed: Current Exercise Habits: Home exercise routine, Type of exercise: walking, Time (Minutes): 10, Frequency (Times/Week): 7, Weekly Exercise (Minutes/Week): 70, Intensity: Mild, Exercise limited by: orthopedic condition(s)  Goals Addressed   None    Depression Screen PHQ 2/9 Scores 10/11/2020 08/14/2020 05/16/2020 11/01/2019 07/30/2019 06/18/2019 05/07/2019  PHQ - 2 Score '3 4 3 5 3 2 6  ' PHQ- 9 Score '12 12 10 12 10 6 17    ' Fall Risk Fall Risk  10/11/2020 08/14/2020 05/16/2020 02/16/2020 07/30/2019  Falls in the past year? 0 0 0 0 0  Number falls in past yr: 0 - - - -  Injury with Fall? 0 - - - -  Risk for fall due to : Orthopedic patient;Impaired balance/gait;Impaired vision;Medication side effect - - - -  Follow up Education provided;Falls prevention discussed - - - -    FALL RISK PREVENTION PERTAINING TO THE HOME:  Any stairs in or around the home? No  If so, are there any without handrails? No  Home free of loose throw rugs in walkways, pet beds, electrical cords, etc? Yes  Adequate lighting in your home to reduce risk of falls? Yes   ASSISTIVE DEVICES UTILIZED TO PREVENT FALLS:  Life alert? No  Use of a cane, walker or w/c? No  Grab bars in the bathroom? No  Shower chair or bench in shower? No  Elevated toilet seat or a handicapped toilet? No   TIMED UP AND GO:  Was the test performed? No . Telephonic visit.  Cognitive Function:     6CIT Screen 10/11/2020  What Year? 0 points  What month? 0 points  What time? 0 points  Count back from 20 0 points  Months in reverse 0 points  Repeat phrase 0 points  Total Score 0    Immunizations Immunization History  Administered Date(s) Administered  . Fluad Quad(high Dose 65+) 02/16/2020  . Influenza Split 03/27/2017  . Influenza, High Dose Seasonal PF  06/01/2018, 02/25/2019  . Influenza,inj,quad, With Preservative 03/21/2017  . Moderna Sars-Covid-2 Vaccination 08/31/2019, 09/28/2019  . Pneumococcal Conjugate-13 08/07/2017  . Pneumococcal Polysaccharide-23 05/07/2019  . Tdap 12/13/2018    TDAP status: Up to date  Flu Vaccine status: Up to date  Pneumococcal vaccine status: Up to date  Covid-19 vaccine status: Completed vaccines  Qualifies for Shingles Vaccine? Yes   Zostavax completed No   Shingrix Completed?: No.    Education has been provided regarding the importance of this vaccine. Patient has been advised to call insurance company to determine out of pocket expense if they have not yet received this vaccine. Advised may also receive vaccine at local pharmacy or Health Dept. Verbalized acceptance and understanding.  Screening Tests Health Maintenance  Topic Date Due  . COVID-19 Vaccine (3 - Booster for Moderna series) 02/28/2020  . INFLUENZA VACCINE  12/25/2020  . OPHTHALMOLOGY EXAM  12/30/2020  . HEMOGLOBIN A1C  02/14/2021  . FOOT EXAM  05/16/2021  . MAMMOGRAM  03/24/2022  . TETANUS/TDAP  12/12/2028  . COLONOSCOPY (Pts 45-33yr Insurance coverage will need to be confirmed)  01/24/2030  . DEXA SCAN  Completed  . Hepatitis C Screening  Completed  . PNA vac Low Risk Adult  Completed  . HPV VACCINES  Aged Out    Health Maintenance  Health Maintenance Due  Topic Date Due  .  COVID-19 Vaccine (3 - Booster for Moderna series) 02/28/2020    Colorectal cancer screening: Type of screening: Colonoscopy. Completed 01/25/2020. Repeat every 10 years  Mammogram status: Completed 03/24/2020. Repeat every year  Bone Density status: Completed 03/28/2020. Results reflect: Bone density results: OSTEOPENIA. Repeat every 2 years.  Lung Cancer Screening: (Low Dose CT Chest recommended if Age 80-80 years, 30 pack-year currently smoking OR have quit w/in 15years.) does not qualify.   Additional Screening:  Hepatitis C Screening: does  not qualify; Completed 11/01/2019  Vision Screening: Recommended annual ophthalmology exams for early detection of glaucoma and other disorders of the eye. Is the patient up to date with their annual eye exam?  Yes  Who is the provider or what is the name of the office in which the patient attends annual eye exams? [facility] If pt is not established with a provider, would they like to be referred to a provider to establish care? No .   Dental Screening: Recommended annual dental exams for proper oral hygiene  Community Resource Referral / Chronic Care Management: CRR required this visit?  No   CCM required this visit?  No      Plan:     I have personally reviewed and noted the following in the patient's chart:   . Medical and social history . Use of alcohol, tobacco or illicit drugs  . Current medications and supplements including opioid prescriptions. Patient is not currently taking opioid prescriptions. . Functional ability and status . Nutritional status . Physical activity . Advanced directives . List of other physicians . Hospitalizations, surgeries, and ER visits in previous 12 months . Vitals . Screenings to include cognitive, depression, and falls . Referrals and appointments  In addition, I have reviewed and discussed with patient certain preventive protocols, quality metrics, and best practice recommendations. A written personalized care plan for preventive services as well as general preventive health recommendations were provided to patient.     Sandrea Hammond, LPN   06/09/6429   Nurse Notes: None

## 2020-10-11 NOTE — Patient Instructions (Signed)
[name] , Thank you for taking time to come for your Medicare Wellness Visit. I appreciate your ongoing commitment to your health goals. Please review the following plan we discussed and let me know if I can assist you in the future.   Screening recommendations/referrals: Colonoscopy: Done 01/25/2020 - Repeat in 10 years Mammogram: Done 03/24/2020 - Repeat annually Bone Density: Done 03/28/2020 - Repeat every 2 years Recommended yearly ophthalmology/optometry visit for glaucoma screening and checkup Recommended yearly dental visit for hygiene and checkup  Vaccinations: Influenza vaccine: Done 02/16/2020 - Repeat annually Pneumococcal vaccine: Done 08/07/2017 & 05/07/2019 Tdap vaccine: Done 12/13/2018 - Repeat in 10 years Shingles vaccine: Shingrix discussed. Please contact your pharmacy for coverage information.    Covid-19:Done 08/31/19, 09/28/19 - due for booster  Advanced directives: Advance directive discussed with you today. I have provided a copy for you to complete at home and have notarized. Once this is complete please bring a copy in to our office so we can scan it into your chart.  Conditions/risks identified: Aim for 30 minutes of exercise or brisk walking each day, drink 6-8 glasses of water and eat lots of fruits and vegetables.  Next appointment: Follow up in one year for your annual wellness visit    Preventive Care 65 Years and Older, Female Preventive care refers to lifestyle choices and visits with your health care provider that can promote health and wellness. What does preventive care include?  A yearly physical exam. This is also called an annual well check.  Dental exams once or twice a year.  Routine eye exams. Ask your health care provider how often you should have your eyes checked.  Personal lifestyle choices, including:  Daily care of your teeth and gums.  Regular physical activity.  Eating a healthy diet.  Avoiding tobacco and drug use.  Limiting  alcohol use.  Practicing safe sex.  Taking low-dose aspirin every day.  Taking vitamin and mineral supplements as recommended by your health care provider. What happens during an annual well check? The services and screenings done by your health care provider during your annual well check will depend on your age, overall health, lifestyle risk factors, and family history of disease. Counseling  Your health care provider may ask you questions about your:  Alcohol use.  Tobacco use.  Drug use.  Emotional well-being.  Home and relationship well-being.  Sexual activity.  Eating habits.  History of falls.  Memory and ability to understand (cognition).  Work and work Statistician.  Reproductive health. Screening  You may have the following tests or measurements:  Height, weight, and BMI.  Blood pressure.  Lipid and cholesterol levels. These may be checked every 5 years, or more frequently if you are over 18 years old.  Skin check.  Lung cancer screening. You may have this screening every year starting at age 15 if you have a 30-pack-year history of smoking and currently smoke or have quit within the past 15 years.  Fecal occult blood test (FOBT) of the stool. You may have this test every year starting at age 33.  Flexible sigmoidoscopy or colonoscopy. You may have a sigmoidoscopy every 5 years or a colonoscopy every 10 years starting at age 71.  Hepatitis C blood test.  Hepatitis B blood test.  Sexually transmitted disease (STD) testing.  Diabetes screening. This is done by checking your blood sugar (glucose) after you have not eaten for a while (fasting). You may have this done every 1-3 years.  Bone  density scan. This is done to screen for osteoporosis. You may have this done starting at age 58.  Mammogram. This may be done every 1-2 years. Talk to your health care provider about how often you should have regular mammograms. Talk with your health care provider  about your test results, treatment options, and if necessary, the need for more tests. Vaccines  Your health care provider may recommend certain vaccines, such as:  Influenza vaccine. This is recommended every year.  Tetanus, diphtheria, and acellular pertussis (Tdap, Td) vaccine. You may need a Td booster every 10 years.  Zoster vaccine. You may need this after age 74.  Pneumococcal 13-valent conjugate (PCV13) vaccine. One dose is recommended after age 19.  Pneumococcal polysaccharide (PPSV23) vaccine. One dose is recommended after age 42. Talk to your health care provider about which screenings and vaccines you need and how often you need them. This information is not intended to replace advice given to you by your health care provider. Make sure you discuss any questions you have with your health care provider. Document Released: 06/09/2015 Document Revised: 01/31/2016 Document Reviewed: 03/14/2015 Elsevier Interactive Patient Education  2017 [place] Prevention in the Home Falls can cause injuries. They can happen to people of all ages. There are many things you can do to make your home safe and to help prevent falls. What can I do on the outside of my home?  Regularly fix the edges of walkways and driveways and fix any cracks.  Remove anything that might make you trip as you walk through a door, such as a raised step or threshold.  Trim any bushes or trees on the path to your home.  Use bright outdoor lighting.  Clear any walking paths of anything that might make someone trip, such as rocks or tools.  Regularly check to see if handrails are loose or broken. Make sure that both sides of any steps have handrails.  Any raised decks and porches should have guardrails on the edges.  Have any leaves, snow, or ice cleared regularly.  Use sand or salt on walking paths during winter.  Clean up any spills in your garage right away. This includes oil or grease  spills. What can I do in the bathroom?  Use night lights.  Install grab bars by the toilet and in the tub and shower. Do not use towel bars as grab bars.  Use non-skid mats or decals in the tub or shower.  If you need to sit down in the shower, use a plastic, non-slip stool.  Keep the floor dry. Clean up any water that spills on the floor as soon as it happens.  Remove soap buildup in the tub or shower regularly.  Attach bath mats securely with double-sided non-slip rug tape.  Do not have throw rugs and other things on the floor that can make you trip. What can I do in the bedroom?  Use night lights.  Make sure that you have a light by your bed that is easy to reach.  Do not use any sheets or blankets that are too big for your bed. They should not hang down onto the floor.  Have a firm chair that has side arms. You can use this for support while you get dressed.  Do not have throw rugs and other things on the floor that can make you trip. What can I do in the kitchen?  Clean up any spills right away.  Avoid walking on  wet floors.  Keep items that you use a lot in easy-to-reach places.  If you need to reach something above you, use a strong step stool that has a grab bar.  Keep electrical cords out of the way.  Do not use floor polish or wax that makes floors slippery. If you must use wax, use non-skid floor wax.  Do not have throw rugs and other things on the floor that can make you trip. What can I do with my stairs?  Do not leave any items on the stairs.  Make sure that there are handrails on both sides of the stairs and use them. Fix handrails that are broken or loose. Make sure that handrails are as long as the stairways.  Check any carpeting to make sure that it is firmly attached to the stairs. Fix any carpet that is loose or worn.  Avoid having throw rugs at the top or bottom of the stairs. If you do have throw rugs, attach them to the floor with carpet  tape.  Make sure that you have a light switch at the top of the stairs and the bottom of the stairs. If you do not have them, ask someone to add them for you. What else can I do to help prevent falls?  Wear shoes that:  Do not have high heels.  Have rubber bottoms.  Are comfortable and fit you well.  Are closed at the toe. Do not wear sandals.  If you use a stepladder:  Make sure that it is fully opened. Do not climb a closed stepladder.  Make sure that both sides of the stepladder are locked into place.  Ask someone to hold it for you, if possible.  Clearly mark and make sure that you can see:  Any grab bars or handrails.  First and last steps.  Where the edge of each step is.  Use tools that help you move around (mobility aids) if they are needed. These include:  Canes.  Walkers.  Scooters.  Crutches.  Turn on the lights when you go into a dark area. Replace any light bulbs as soon as they burn out.  Set up your furniture so you have a clear path. Avoid moving your furniture around.  If any of your floors are uneven, fix them.  If there are any pets around you, be aware of where they are.  Review your medicines with your doctor. Some medicines can make you feel dizzy. This can increase your chance of falling. Ask your doctor what other things that you can do to help prevent falls. This information is not intended to replace advice given to you by your health care provider. Make sure you discuss any questions you have with your health care provider. Document Released: 03/09/2009 Document Revised: 10/19/2015 Document Reviewed: 06/17/2014 Elsevier Interactive Patient Education  2017 [employer].

## 2020-11-16 ENCOUNTER — Encounter: Payer: Self-pay | Admitting: Family

## 2020-11-16 ENCOUNTER — Ambulatory Visit (INDEPENDENT_AMBULATORY_CARE_PROVIDER_SITE_OTHER): Payer: Medicare HMO | Admitting: Family

## 2020-11-16 ENCOUNTER — Other Ambulatory Visit: Payer: Self-pay

## 2020-11-16 VITALS — BP 139/89 | HR 74 | Temp 97.2°F | Ht <= 58 in | Wt 196.0 lb

## 2020-11-16 DIAGNOSIS — E1169 Type 2 diabetes mellitus with other specified complication: Secondary | ICD-10-CM | POA: Diagnosis not present

## 2020-11-16 DIAGNOSIS — E1165 Type 2 diabetes mellitus with hyperglycemia: Secondary | ICD-10-CM

## 2020-11-16 DIAGNOSIS — M159 Polyosteoarthritis, unspecified: Secondary | ICD-10-CM

## 2020-11-16 DIAGNOSIS — R69 Illness, unspecified: Secondary | ICD-10-CM | POA: Diagnosis not present

## 2020-11-16 DIAGNOSIS — M8949 Other hypertrophic osteoarthropathy, multiple sites: Secondary | ICD-10-CM

## 2020-11-16 DIAGNOSIS — K219 Gastro-esophageal reflux disease without esophagitis: Secondary | ICD-10-CM

## 2020-11-16 DIAGNOSIS — F3341 Major depressive disorder, recurrent, in partial remission: Secondary | ICD-10-CM

## 2020-11-16 DIAGNOSIS — E785 Hyperlipidemia, unspecified: Secondary | ICD-10-CM | POA: Diagnosis not present

## 2020-11-16 DIAGNOSIS — E039 Hypothyroidism, unspecified: Secondary | ICD-10-CM

## 2020-11-16 DIAGNOSIS — I10 Essential (primary) hypertension: Secondary | ICD-10-CM | POA: Diagnosis not present

## 2020-11-16 LAB — CBC WITH DIFFERENTIAL/PLATELET
Basophils Absolute: 0.1 10*3/uL (ref 0.0–0.2)
Basos: 1 %
EOS (ABSOLUTE): 0.8 10*3/uL — ABNORMAL HIGH (ref 0.0–0.4)
Eos: 10 %
Hematocrit: 35.5 % (ref 34.0–46.6)
Hemoglobin: 11.5 g/dL (ref 11.1–15.9)
Immature Grans (Abs): 0 10*3/uL (ref 0.0–0.1)
Immature Granulocytes: 1 %
Lymphocytes Absolute: 2.6 10*3/uL (ref 0.7–3.1)
Lymphs: 30 %
MCH: 28.9 pg (ref 26.6–33.0)
MCHC: 32.4 g/dL (ref 31.5–35.7)
MCV: 89 fL (ref 79–97)
Monocytes Absolute: 0.4 10*3/uL (ref 0.1–0.9)
Monocytes: 5 %
Neutrophils Absolute: 4.7 10*3/uL (ref 1.4–7.0)
Neutrophils: 53 %
Platelets: 256 10*3/uL (ref 150–450)
RBC: 3.98 x10E6/uL (ref 3.77–5.28)
RDW: 15.3 % (ref 11.7–15.4)
WBC: 8.6 10*3/uL (ref 3.4–10.8)

## 2020-11-16 LAB — CMP14+EGFR
ALT: 20 IU/L (ref 0–32)
AST: 15 IU/L (ref 0–40)
Albumin/Globulin Ratio: 2.4 — ABNORMAL HIGH (ref 1.2–2.2)
Albumin: 4.3 g/dL (ref 3.8–4.8)
Alkaline Phosphatase: 65 IU/L (ref 44–121)
BUN/Creatinine Ratio: 20 (ref 12–28)
BUN: 15 mg/dL (ref 8–27)
Bilirubin Total: 0.3 mg/dL (ref 0.0–1.2)
CO2: 20 mmol/L (ref 20–29)
Calcium: 9.3 mg/dL (ref 8.7–10.3)
Chloride: 99 mmol/L (ref 96–106)
Creatinine, Ser: 0.76 mg/dL (ref 0.57–1.00)
Globulin, Total: 1.8 g/dL (ref 1.5–4.5)
Glucose: 172 mg/dL — ABNORMAL HIGH (ref 65–99)
Potassium: 4.7 mmol/L (ref 3.5–5.2)
Sodium: 138 mmol/L (ref 134–144)
Total Protein: 6.1 g/dL (ref 6.0–8.5)
eGFR: 85 mL/min/{1.73_m2} (ref >59)

## 2020-11-16 LAB — LIPID PANEL
Chol/HDL Ratio: 2.6 ratio (ref 0.0–4.4)
Cholesterol, Total: 117 mg/dL (ref 100–199)
HDL: 45 mg/dL (ref >39)
LDL Chol Calc (NIH): 49 mg/dL (ref 0–99)
Triglycerides: 130 mg/dL (ref 0–149)
VLDL Cholesterol Cal: 23 mg/dL (ref 5–40)

## 2020-11-16 LAB — BAYER DCA HB A1C WAIVED: HB A1C (BAYER DCA - WAIVED): 7.1 % — ABNORMAL HIGH (ref <7.0)

## 2020-11-16 NOTE — Progress Notes (Signed)
Subjective:    Patient ID: [name], female    DOB: 1950-10-24, 70 y.o.   MRN: [id number]  Chief Complaint  Patient presents with   Medical Management of Chronic Issues   Diabetes   Hyperlipidemia    Pt presents to the office today for chronic follow up. She reports her sister-in- law, sister, and nephew have all passed away end of 07/17/19. She reports her anxiety and depression is worse because of this.  Diabetes She presents for her follow-up diabetic visit. She has type 2 diabetes mellitus. Her disease course has been stable. Hypoglycemia symptoms include nervousness/anxiousness. Pertinent negatives for diabetes include no blurred vision, no fatigue and no foot paresthesias. Symptoms are stable. Risk factors for coronary artery disease include dyslipidemia, diabetes mellitus, hypertension, sedentary lifestyle and post-menopausal. She is following a generally healthy diet. Her overall blood glucose range is 140-180 mg/dl. An ACE inhibitor/angiotensin II receptor blocker is being taken. Eye exam is current.  Hyperlipidemia This is a chronic problem. The current episode started more than 1 year ago. The problem is controlled. Current antihyperlipidemic treatment includes statins. The current treatment provides moderate improvement of lipids. Risk factors for coronary artery disease include dyslipidemia, diabetes mellitus, hypertension, a sedentary lifestyle and post-menopausal.  Anxiety Presents for follow-up visit. Symptoms include depressed mood, excessive worry, irritability, nervous/anxious behavior and restlessness. Symptoms occur most days. The severity of symptoms is moderate.    Depression        This is a chronic problem.  The current episode started more than 1 year ago.   The onset quality is gradual.   The problem occurs intermittently.  Associated symptoms include restlessness and decreased interest.  Associated symptoms include no fatigue, no helplessness, no hopelessness and  no appetite change.  Past treatments include SSRIs - Selective serotonin reuptake inhibitors.  Past medical history includes thyroid problem and anxiety.   Gastroesophageal Reflux She complains of belching and heartburn. This is a chronic problem. The current episode started more than 1 year ago. The problem occurs frequently. The symptoms are aggravated by stress. Pertinent negatives include no fatigue. She has tried a PPI for the symptoms. The treatment provided moderate relief.  Thyroid Problem Presents for follow-up visit. Symptoms include anxiety and depressed mood. Patient reports no fatigue. The symptoms have been stable. Her past medical history is significant for hyperlipidemia.  Arthritis Presents for follow-up visit. She complains of pain and stiffness. The symptoms have been stable. Affected locations include the right MCP and left MCP. Her pain is at a severity of 3/10. Pertinent negatives include no fatigue.     Review of Systems  Constitutional:  Positive for irritability. Negative for appetite change and fatigue.  Eyes:  Negative for blurred vision.  Gastrointestinal:  Positive for heartburn.  Musculoskeletal:  Positive for arthritis and stiffness.  Psychiatric/Behavioral:  Positive for depression. The patient is nervous/anxious.   All other systems reviewed and are negative.     Objective:   Physical Exam Vitals reviewed.  Constitutional:      General: She is not in acute distress.    Appearance: She is well-developed. She is obese.  HENT:     Head: Normocephalic and atraumatic.     Right Ear: Tympanic membrane normal.     Left Ear: Tympanic membrane normal.  Eyes:     Pupils: Pupils are equal, round, and reactive to light.  Neck:     Thyroid: No thyromegaly.  Cardiovascular:     Rate and Rhythm: Normal  rate and regular rhythm.     Heart sounds: Normal heart sounds. No murmur heard. Pulmonary:     Effort: Pulmonary effort is normal. No respiratory distress.      Breath sounds: Normal breath sounds. No wheezing.  Abdominal:     General: Bowel sounds are normal. There is no distension.     Palpations: Abdomen is soft.     Tenderness: There is no abdominal tenderness.  Musculoskeletal:        General: No tenderness. Normal range of motion.     Cervical back: Normal range of motion and neck supple.  Skin:    General: Skin is warm and dry.  Neurological:     Mental Status: She is alert and oriented to person, place, and time.     Cranial Nerves: No cranial nerve deficit.     Deep Tendon Reflexes: Reflexes are normal and symmetric.  Psychiatric:        Behavior: Behavior normal.        Thought Content: Thought content normal.        Judgment: Judgment normal.      BP 139/89   Pulse 74   Temp (!) 97.2 F (36.2 C) (Oral)   Ht _0  (1.473 m)   Wt 196 lb (88.9 kg)   BMI 40.96 kg/m      Assessment & Plan:  [name] comes in today with chief complaint of Medical Management of Chronic Issues, Diabetes, and Hyperlipidemia   Diagnosis and orders addressed:  1. Type 2 diabetes mellitus with hyperglycemia, without long-term current use of insulin (HCC) - CBC with Differential/Platelet - CMP14+EGFR - Bayer DCA Hb A1c Waived  2. Hyperlipidemia associated with type 2 diabetes mellitus ([place]) - Lipid panel  3. Essential hypertension  4. Gastroesophageal reflux disease, unspecified whether esophagitis present  5. Hypothyroidism, unspecified type - TSH  6. Primary osteoarthritis involving multiple joints  7. Recurrent major depressive disorder, in partial remission ([place])   Labs pending Health Maintenance reviewed Diet and exercise encouraged  Follow up plan: 3 months    Evelina Dun, FNP

## 2020-11-16 NOTE — Patient Instructions (Signed)

## 2020-11-18 LAB — TSH: TSH: 0.206 u[IU]/mL — ABNORMAL LOW (ref 0.450–4.500)

## 2020-11-18 LAB — SPECIMEN STATUS REPORT

## 2020-11-21 ENCOUNTER — Other Ambulatory Visit: Payer: Self-pay | Admitting: Family

## 2020-11-21 MED ORDER — LEVOTHYROXINE SODIUM 75 MCG PO TABS: 75.0000 ug | ORAL_TABLET | Freq: Every day | ORAL | 3 refills | Status: DC

## 2020-11-23 ENCOUNTER — Telehealth: Payer: Self-pay | Admitting: *Deleted

## 2020-11-23 NOTE — Telephone Encounter (Signed)
Pt assistance med here - #5 ozempic boxes   Aware to pick up - in fridge with her name on it

## 2020-12-14 ENCOUNTER — Telehealth: Payer: Self-pay | Admitting: Family

## 2020-12-14 DIAGNOSIS — R1084 Generalized abdominal pain: Secondary | ICD-10-CM

## 2020-12-14 NOTE — Telephone Encounter (Signed)
Referral to GI  

## 2021-01-10 ENCOUNTER — Other Ambulatory Visit: Payer: Self-pay | Admitting: Family

## 2021-01-10 DIAGNOSIS — E7800 Pure hypercholesterolemia, unspecified: Secondary | ICD-10-CM

## 2021-01-15 ENCOUNTER — Other Ambulatory Visit: Payer: Self-pay | Admitting: Family

## 2021-01-15 DIAGNOSIS — F3341 Major depressive disorder, recurrent, in partial remission: Secondary | ICD-10-CM

## 2021-01-18 ENCOUNTER — Other Ambulatory Visit: Payer: Self-pay | Admitting: Gastroenterology

## 2021-01-23 ENCOUNTER — Other Ambulatory Visit: Payer: Self-pay | Admitting: Family

## 2021-01-23 DIAGNOSIS — E119 Type 2 diabetes mellitus without complications: Secondary | ICD-10-CM

## 2021-02-05 ENCOUNTER — Ambulatory Visit
Admission: RE | Admit: 2021-02-05 | Discharge: 2021-02-05 | Disposition: A | Discharge status: Home / Self Care | Payer: Medicare HMO | Source: Ambulatory Visit | Attending: Family | Admitting: Family

## 2021-02-05 ENCOUNTER — Other Ambulatory Visit: Payer: Self-pay

## 2021-02-05 ENCOUNTER — Other Ambulatory Visit: Payer: Self-pay | Admitting: Family

## 2021-02-05 DIAGNOSIS — Z1231 Encounter for screening mammogram for malignant neoplasm of breast: Secondary | ICD-10-CM

## 2021-02-19 ENCOUNTER — Ambulatory Visit (INDEPENDENT_AMBULATORY_CARE_PROVIDER_SITE_OTHER): Payer: Medicare HMO | Admitting: Family

## 2021-02-19 ENCOUNTER — Encounter: Payer: Self-pay | Admitting: Family

## 2021-02-19 ENCOUNTER — Other Ambulatory Visit: Payer: Self-pay

## 2021-02-19 VITALS — BP 116/60 | HR 75 | Temp 97.6°F | Ht 58.5 in | Wt 189.0 lb

## 2021-02-19 DIAGNOSIS — E785 Hyperlipidemia, unspecified: Secondary | ICD-10-CM

## 2021-02-19 DIAGNOSIS — K219 Gastro-esophageal reflux disease without esophagitis: Secondary | ICD-10-CM | POA: Diagnosis not present

## 2021-02-19 DIAGNOSIS — E1165 Type 2 diabetes mellitus with hyperglycemia: Secondary | ICD-10-CM | POA: Diagnosis not present

## 2021-02-19 DIAGNOSIS — E039 Hypothyroidism, unspecified: Secondary | ICD-10-CM | POA: Diagnosis not present

## 2021-02-19 DIAGNOSIS — E1169 Type 2 diabetes mellitus with other specified complication: Secondary | ICD-10-CM | POA: Diagnosis not present

## 2021-02-19 DIAGNOSIS — M159 Polyosteoarthritis, unspecified: Secondary | ICD-10-CM

## 2021-02-19 DIAGNOSIS — Z23 Encounter for immunization: Secondary | ICD-10-CM | POA: Diagnosis not present

## 2021-02-19 DIAGNOSIS — M8949 Other hypertrophic osteoarthropathy, multiple sites: Secondary | ICD-10-CM

## 2021-02-19 DIAGNOSIS — I10 Essential (primary) hypertension: Secondary | ICD-10-CM

## 2021-02-19 DIAGNOSIS — R69 Illness, unspecified: Secondary | ICD-10-CM | POA: Diagnosis not present

## 2021-02-19 DIAGNOSIS — F3341 Major depressive disorder, recurrent, in partial remission: Secondary | ICD-10-CM

## 2021-02-19 LAB — BAYER DCA HB A1C WAIVED: HB A1C (BAYER DCA - WAIVED): 6.6 % — ABNORMAL HIGH (ref 4.8–5.6)

## 2021-02-19 MED ORDER — ESCITALOPRAM OXALATE 20 MG PO TABS: 20.0000 mg | ORAL_TABLET | Freq: Every day | ORAL | 1 refills | Status: DC

## 2021-02-19 MED ORDER — LISINOPRIL 2.5 MG PO TABS: 2.5000 mg | ORAL_TABLET | Freq: Every day | ORAL | 1 refills | Status: DC

## 2021-02-19 MED ORDER — METFORMIN HCL 1000 MG PO TABS: 1000.0000 mg | ORAL_TABLET | Freq: Two times a day (BID) | ORAL | 2 refills | Status: DC

## 2021-02-19 MED ORDER — LEVOTHYROXINE SODIUM 75 MCG PO TABS: 75.0000 ug | ORAL_TABLET | Freq: Every day | ORAL | 3 refills | Status: DC

## 2021-02-19 MED ORDER — BUPROPION HCL ER (XL) 150 MG PO TB24: 150.0000 mg | ORAL_TABLET | Freq: Every day | ORAL | 0 refills | Status: DC

## 2021-02-19 MED ORDER — ATORVASTATIN CALCIUM 10 MG PO TABS: 10.0000 mg | ORAL_TABLET | Freq: Every day | ORAL | 1 refills | Status: DC

## 2021-02-19 NOTE — Patient Instructions (Signed)
Health Maintenance After Age 68 After age 68, you are at a higher risk for certain long-term diseases and infections as well as injuries from falls. Falls are a major cause of broken bones and head injuries in people who are older than age 68. Getting regular preventive care can help to keep you healthy and well. Preventive care includes getting regular testing and making lifestyle changes as recommended by your health care provider. Talk with your health care provider about: Which screenings and tests you should have. A screening is a test that checks for a disease when you have no symptoms. A diet and exercise plan that is right for you. What should I know about screenings and tests to prevent falls? Screening and testing are the best ways to find a health problem early. Early diagnosis and treatment give you the best chance of managing medical conditions that are common after age 68. Certain conditions and lifestyle choices may make you more likely to have a fall. Your health care provider may recommend: Regular vision checks. Poor vision and conditions such as cataracts can make you more likely to have a fall. If you wear glasses, make sure to get your prescription updated if your vision changes. Medicine review. Work with your health care provider to regularly review all of the medicines you are taking, including over-the-counter medicines. Ask your health care provider about any side effects that may make you more likely to have a fall. Tell your health care provider if any medicines that you take make you feel dizzy or sleepy. Osteoporosis screening. Osteoporosis is a condition that causes the bones to get weaker. This can make the bones weak and cause them to break more easily. Blood pressure screening. Blood pressure changes and medicines to control blood pressure can make you feel dizzy. Strength and balance checks. Your health care provider may recommend certain tests to check your strength and  balance while standing, walking, or changing positions. Foot health exam. Foot pain and numbness, as well as not wearing proper footwear, can make you more likely to have a fall. Depression screening. You may be more likely to have a fall if you have a fear of falling, feel emotionally low, or feel unable to do activities that you used to do. Alcohol use screening. Using too much alcohol can affect your balance and may make you more likely to have a fall. What actions can I take to lower my risk of falls? General instructions Talk with your health care provider about your risks for falling. Tell your health care provider if: You fall. Be sure to tell your health care provider about all falls, even ones that seem minor. You feel dizzy, sleepy, or off-balance. Take over-the-counter and prescription medicines only as told by your health care provider. These include any supplements. Eat a healthy diet and maintain a healthy weight. A healthy diet includes low-fat dairy products, low-fat (lean) meats, and fiber from whole grains, beans, and lots of fruits and vegetables. Home safety Remove any tripping hazards, such as rugs, cords, and clutter. Install safety equipment such as grab bars in bathrooms and safety rails on stairs. Keep rooms and walkways well-lit. Activity  Follow a regular exercise program to stay fit. This will help you maintain your balance. Ask your health care provider what types of exercise are appropriate for you. If you need a cane or walker, use it as recommended by your health care provider. Wear supportive shoes that have nonskid soles. Lifestyle Do not   drink alcohol if your health care provider tells you not to drink. If you drink alcohol, limit how much you have: 0-1 drink a day for women. 0-2 drinks a day for men. Be aware of how much alcohol is in your drink. In the [place] one drink equals one typical bottle of beer (12 oz), one-half glass of wine (5 oz), or one shot of  hard liquor (1 oz). Do not use any products that contain nicotine or tobacco, such as cigarettes and e-cigarettes. If you need help quitting, ask your health care provider. Summary Having a healthy lifestyle and getting preventive care can help to protect your health and wellness after age 68. Screening and testing are the best way to find a health problem early and help you avoid having a fall. Early diagnosis and treatment give you the best chance for managing medical conditions that are more common for people who are older than age 68. Falls are a major cause of broken bones and head injuries in people who are older than age 68. Take precautions to prevent a fall at home. Work with your health care provider to learn what changes you can make to improve your health and wellness and to prevent falls. This information is not intended to replace advice given to you by your health care provider. Make sure you discuss any questions you have with your health care provider. Document Revised: 07/21/2020 Document Reviewed: 04/28/2020 Elsevier Patient Education  2022 [employer]  

## 2021-02-19 NOTE — Progress Notes (Signed)
Subjective:    Patient ID: [name], female    DOB: February 19, 1951, 70 y.o.   MRN: [id number]  Chief Complaint  Patient presents with   Medical Management of Chronic Issues    Would like Flu vaccine today.   Pt presents to the office today for chronic follow up. She reports her sister-in- law, sister, and nephew have all passed away end of 13-Aug-2019. She reports her anxiety and depression is worse because of this.  Hypertension This is a chronic problem. The current episode started more than 1 year ago. The problem has been resolved since onset. The problem is controlled. Pertinent negatives include no blurred vision, malaise/fatigue, peripheral edema or shortness of breath. Risk factors for coronary artery disease include dyslipidemia and obesity. The current treatment provides moderate improvement. Identifiable causes of hypertension include a thyroid problem.  Gastroesophageal Reflux She complains of belching and heartburn. This is a chronic problem. The problem occurs occasionally. The problem has been waxing and waning. Associated symptoms include fatigue. She has tried a PPI for the symptoms. The treatment provided moderate relief.  Thyroid Problem Presents for follow-up visit. Symptoms include depressed mood and fatigue. Patient reports no anxiety, constipation or diarrhea. The symptoms have been stable. Her past medical history is significant for hyperlipidemia.  Hyperlipidemia This is a chronic problem. The current episode started more than 1 year ago. Exacerbating diseases include obesity. Pertinent negatives include no shortness of breath. Current antihyperlipidemic treatment includes statins. The current treatment provides moderate improvement of lipids.  Depression        This is a chronic problem.  The onset quality is gradual.   The problem occurs intermittently.  Associated symptoms include fatigue, helplessness, hopelessness, restlessness and decreased interest.  Associated  symptoms include not sad.  Past treatments include SSRIs - Selective serotonin reuptake inhibitors.  Past medical history includes thyroid problem.   Anxiety Presents for follow-up visit. Symptoms include depressed mood, excessive worry, irritability and restlessness. Patient reports no nervous/anxious behavior or shortness of breath.    Diabetes She presents for her follow-up diabetic visit. She has type 2 diabetes mellitus. There are no hypoglycemic associated symptoms. Pertinent negatives for hypoglycemia include no nervousness/anxiousness. Associated symptoms include fatigue. Pertinent negatives for diabetes include no blurred vision and no foot paresthesias. Symptoms are stable. Risk factors for coronary artery disease include dyslipidemia, diabetes mellitus, sedentary lifestyle, hypertension and post-menopausal. She is following a generally healthy diet. Her overall blood glucose range is 110-130 mg/dl. Eye exam is not current.  Arthritis Presents for follow-up visit. She complains of pain and stiffness. The symptoms have been stable. Affected locations include the left MCP, right MCP, right knee and left knee. Her pain is at a severity of 3/10. Associated symptoms include fatigue. Pertinent negatives include no diarrhea.     Review of Systems  Constitutional:  Positive for fatigue and irritability. Negative for malaise/fatigue.  Eyes:  Negative for blurred vision.  Respiratory:  Negative for shortness of breath.   Gastrointestinal:  Positive for heartburn. Negative for constipation and diarrhea.  Musculoskeletal:  Positive for arthritis and stiffness.  Psychiatric/Behavioral:  Positive for depression. The patient is not nervous/anxious.   All other systems reviewed and are negative.     Objective:   Physical Exam Vitals reviewed.  Constitutional:      General: She is not in acute distress.    Appearance: She is well-developed. She is obese.  HENT:     Head: Normocephalic and  atraumatic.  Right Ear: Tympanic membrane normal.     Left Ear: Tympanic membrane normal.  Eyes:     Pupils: Pupils are equal, round, and reactive to light.  Neck:     Thyroid: No thyromegaly.  Cardiovascular:     Rate and Rhythm: Normal rate and regular rhythm.     Heart sounds: Normal heart sounds. No murmur heard. Pulmonary:     Effort: Pulmonary effort is normal. No respiratory distress.     Breath sounds: Normal breath sounds. No wheezing.  Abdominal:     General: Bowel sounds are normal. There is no distension.     Palpations: Abdomen is soft.     Tenderness: There is no abdominal tenderness.  Musculoskeletal:        General: No tenderness. Normal range of motion.     Cervical back: Normal range of motion and neck supple.  Skin:    General: Skin is warm and dry.  Neurological:     Mental Status: She is alert and oriented to person, place, and time.     Cranial Nerves: No cranial nerve deficit.     Deep Tendon Reflexes: Reflexes are normal and symmetric.  Psychiatric:        Behavior: Behavior normal.        Thought Content: Thought content normal.        Judgment: Judgment normal.         BP 116/60   Pulse 75   Temp 97.6 F (36.4 C) (Temporal)   Ht 4' 10.5" (1.486 m)   Wt 189 lb (85.7 kg)   BMI 38.83 kg/m   Assessment & Plan:  [name] comes in today with chief complaint of Medical Management of Chronic Issues (Would like Flu vaccine today.)   Diagnosis and orders addressed:  1. Type 2 diabetes mellitus with hyperglycemia, without long-term current use of insulin (HCC) - lisinopril (ZESTRIL) 2.5 MG tablet; Take 1 tablet (2.5 mg total) by mouth daily.  Dispense: 90 tablet; Refill: 1 - metFORMIN (GLUCOPHAGE) 1000 MG tablet; Take 1 tablet (1,000 mg total) by mouth 2 (two) times daily with a meal.  Dispense: 180 tablet; Refill: 2 - Bayer DCA Hb A1c Waived - CMP14+EGFR  2. Recurrent major depressive disorder, in partial remission (HCC) - buPROPion  (WELLBUTRIN XL) 150 MG 24 hr tablet; Take 1 tablet (150 mg total) by mouth daily.  Dispense: 90 tablet; Refill: 0 - escitalopram (LEXAPRO) 20 MG tablet; Take 1 tablet (20 mg total) by mouth daily.  Dispense: 90 tablet; Refill: 1 - CMP14+EGFR  3. Essential hypertension - lisinopril (ZESTRIL) 2.5 MG tablet; Take 1 tablet (2.5 mg total) by mouth daily.  Dispense: 90 tablet; Refill: 1 - CMP14+EGFR  4. Gastroesophageal reflux disease, unspecified whether esophagitis present - CMP14+EGFR  5. Hypothyroidism, unspecified type - CMP14+EGFR - TSH  6. Hyperlipidemia associated with type 2 diabetes mellitus (HCC) - atorvastatin (LIPITOR) 10 MG tablet; Take 1 tablet (10 mg total) by mouth daily.  Dispense: 90 tablet; Refill: 1 - CMP14+EGFR  7. Primary osteoarthritis involving multiple joints - CMP14+EGFR  8. Need for immunization against influenza - Flu Vaccine QUAD High Dose(Fluad) - CMP14+EGFR   Labs pending Health Maintenance reviewed Diet and exercise encouraged  Follow up plan: 6 months   Evelina Dun, FNP

## 2021-02-20 ENCOUNTER — Other Ambulatory Visit: Payer: Self-pay | Admitting: Family

## 2021-02-20 LAB — CMP14+EGFR
ALT: 16 IU/L (ref 0–32)
AST: 18 IU/L (ref 0–40)
Albumin/Globulin Ratio: 2.4 — ABNORMAL HIGH (ref 1.2–2.2)
Albumin: 4.3 g/dL (ref 3.8–4.8)
Alkaline Phosphatase: 59 IU/L (ref 44–121)
BUN/Creatinine Ratio: 11 — ABNORMAL LOW (ref 12–28)
BUN: 11 mg/dL (ref 8–27)
Bilirubin Total: 0.3 mg/dL (ref 0.0–1.2)
CO2: 21 mmol/L (ref 20–29)
Calcium: 9.4 mg/dL (ref 8.7–10.3)
Chloride: 100 mmol/L (ref 96–106)
Creatinine, Ser: 0.97 mg/dL (ref 0.57–1.00)
Globulin, Total: 1.8 g/dL (ref 1.5–4.5)
Glucose: 127 mg/dL — ABNORMAL HIGH (ref 70–99)
Potassium: 4.8 mmol/L (ref 3.5–5.2)
Sodium: 139 mmol/L (ref 134–144)
Total Protein: 6.1 g/dL (ref 6.0–8.5)
eGFR: 64 mL/min/{1.73_m2} (ref >59)

## 2021-02-20 LAB — TSH: TSH: 0.392 u[IU]/mL — ABNORMAL LOW (ref 0.450–4.500)

## 2021-02-20 MED ORDER — LEVOTHYROXINE SODIUM 50 MCG PO TABS: 50.0000 ug | ORAL_TABLET | Freq: Every day | ORAL | 1 refills | Status: DC

## 2021-04-02 ENCOUNTER — Other Ambulatory Visit: Payer: Self-pay

## 2021-04-02 ENCOUNTER — Ambulatory Visit
Admission: RE | Admit: 2021-04-02 | Discharge: 2021-04-02 | Disposition: A | Discharge status: Home / Self Care | Payer: Medicare HMO | Source: Ambulatory Visit | Attending: Family | Admitting: Family

## 2021-04-02 DIAGNOSIS — Z1231 Encounter for screening mammogram for malignant neoplasm of breast: Secondary | ICD-10-CM | POA: Diagnosis not present

## 2021-04-03 DIAGNOSIS — H5200 Hypermetropia, unspecified eye: Secondary | ICD-10-CM | POA: Diagnosis not present

## 2021-04-03 DIAGNOSIS — H25813 Combined forms of age-related cataract, bilateral: Secondary | ICD-10-CM | POA: Diagnosis not present

## 2021-04-03 DIAGNOSIS — E119 Type 2 diabetes mellitus without complications: Secondary | ICD-10-CM | POA: Diagnosis not present

## 2021-04-03 DIAGNOSIS — E7800 Pure hypercholesterolemia, unspecified: Secondary | ICD-10-CM | POA: Diagnosis not present

## 2021-04-03 LAB — HM DIABETES EYE EXAM

## 2021-04-24 ENCOUNTER — Ambulatory Visit (INDEPENDENT_AMBULATORY_CARE_PROVIDER_SITE_OTHER): Payer: Medicare HMO | Admitting: Family

## 2021-04-24 ENCOUNTER — Other Ambulatory Visit: Payer: Self-pay

## 2021-04-24 ENCOUNTER — Encounter: Payer: Self-pay | Admitting: Family

## 2021-04-24 VITALS — BP 133/61 | HR 63 | Temp 97.2°F | Ht 58.5 in | Wt 186.8 lb

## 2021-04-24 DIAGNOSIS — R69 Illness, unspecified: Secondary | ICD-10-CM | POA: Diagnosis not present

## 2021-04-24 DIAGNOSIS — F3341 Major depressive disorder, recurrent, in partial remission: Secondary | ICD-10-CM

## 2021-04-24 DIAGNOSIS — E039 Hypothyroidism, unspecified: Secondary | ICD-10-CM | POA: Diagnosis not present

## 2021-04-24 DIAGNOSIS — E1169 Type 2 diabetes mellitus with other specified complication: Secondary | ICD-10-CM | POA: Diagnosis not present

## 2021-04-24 NOTE — Patient Instructions (Signed)

## 2021-04-24 NOTE — Progress Notes (Signed)
Subjective:    Patient ID: [name], female    DOB: 12-19-1950, 70 y.o.   MRN: [id number]  Chief Complaint  Patient presents with   Hypothyroidism   Pt presents to the office today to recheck thyroid.  Thyroid Problem Presents for follow-up visit. Symptoms include dry skin. Patient reports no cold intolerance, constipation, fatigue or hair loss. The symptoms have been stable.  Depression        This is a chronic problem.  The current episode started more than 1 year ago.   Associated symptoms include irritable, decreased interest and sad.  Associated symptoms include no fatigue, no helplessness and no hopelessness.  Past treatments include SSRIs - Selective serotonin reuptake inhibitors.  Compliance with treatment is good.  Past medical history includes thyroid problem.   Diabetes She presents for her follow-up diabetic visit. She has type 2 diabetes mellitus. Pertinent negatives for diabetes include no blurred vision, no fatigue and no foot paresthesias. Symptoms are stable. Diabetic complications include heart disease. Risk factors for coronary artery disease include diabetes mellitus, dyslipidemia, hypertension and sedentary lifestyle. She is following a generally healthy diet. Her overall blood glucose range is 90-110 mg/dl.     Review of Systems  Constitutional:  Negative for fatigue.  Eyes:  Negative for blurred vision.  Gastrointestinal:  Negative for constipation.  Endocrine: Negative for cold intolerance.  Psychiatric/Behavioral:  Positive for depression.   All other systems reviewed and are negative.     Objective:   Physical Exam Vitals reviewed.  Constitutional:      General: She is irritable. She is not in acute distress.    Appearance: She is well-developed. She is obese.  HENT:     Head: Normocephalic and atraumatic.  Eyes:     Pupils: Pupils are equal, round, and reactive to light.  Neck:     Thyroid: No thyromegaly.  Cardiovascular:     Rate and  Rhythm: Normal rate and regular rhythm.     Heart sounds: Normal heart sounds. No murmur heard. Pulmonary:     Effort: Pulmonary effort is normal. No respiratory distress.     Breath sounds: Normal breath sounds. No wheezing.  Abdominal:     General: Bowel sounds are normal. There is no distension.     Palpations: Abdomen is soft.     Tenderness: There is no abdominal tenderness.  Musculoskeletal:        General: No tenderness. Normal range of motion.     Cervical back: Normal range of motion and neck supple.  Skin:    General: Skin is warm and dry.  Neurological:     Mental Status: She is alert and oriented to person, place, and time.     Cranial Nerves: No cranial nerve deficit.     Deep Tendon Reflexes: Reflexes are normal and symmetric.  Psychiatric:        Behavior: Behavior normal.        Thought Content: Thought content normal.        Judgment: Judgment normal.      BP 133/61   Pulse 63   Temp (!) 97.2 F (36.2 C) (Temporal)   Ht 4' 10.5" (1.486 m)   Wt 186 lb 12.8 oz (84.7 kg)   BMI 38.38 kg/m      Assessment & Plan:  [name] comes in today with chief complaint of Hypothyroidism   Diagnosis and orders addressed:  1. Hypothyroidism, unspecified type - CMP14+EGFR - TSH  2. Recurrent  major depressive disorder, in partial remission ([place]) - CMP14+EGFR  3. Type 2 diabetes mellitus with other specified complication, without long-term current use of insulin ([place]) - CMP14+EGFR   Labs pending Health Maintenance reviewed Diet and exercise encouraged  Follow up plan: 4 months    Evelina Dun, FNP

## 2021-04-25 LAB — CMP14+EGFR
ALT: 21 IU/L (ref 0–32)
AST: 24 IU/L (ref 0–40)
Albumin/Globulin Ratio: 2.1 (ref 1.2–2.2)
Albumin: 4.1 g/dL (ref 3.8–4.8)
Alkaline Phosphatase: 65 IU/L (ref 44–121)
BUN/Creatinine Ratio: 16 (ref 12–28)
BUN: 13 mg/dL (ref 8–27)
Bilirubin Total: 0.3 mg/dL (ref 0.0–1.2)
CO2: 25 mmol/L (ref 20–29)
Calcium: 9.9 mg/dL (ref 8.7–10.3)
Chloride: 100 mmol/L (ref 96–106)
Creatinine, Ser: 0.83 mg/dL (ref 0.57–1.00)
Globulin, Total: 2 g/dL (ref 1.5–4.5)
Glucose: 172 mg/dL — ABNORMAL HIGH (ref 70–99)
Potassium: 5.7 mmol/L — ABNORMAL HIGH (ref 3.5–5.2)
Sodium: 141 mmol/L (ref 134–144)
Total Protein: 6.1 g/dL (ref 6.0–8.5)
eGFR: 77 mL/min/{1.73_m2} (ref >59)

## 2021-04-25 LAB — TSH: TSH: 9.85 u[IU]/mL — ABNORMAL HIGH (ref 0.450–4.500)

## 2021-04-26 ENCOUNTER — Other Ambulatory Visit: Payer: Medicare HMO

## 2021-04-26 ENCOUNTER — Other Ambulatory Visit: Payer: Self-pay | Admitting: Family Medicine

## 2021-04-26 ENCOUNTER — Other Ambulatory Visit: Payer: Self-pay | Admitting: Family

## 2021-04-26 DIAGNOSIS — E875 Hyperkalemia: Secondary | ICD-10-CM

## 2021-04-26 MED ORDER — LEVOTHYROXINE SODIUM 75 MCG PO TABS: 75.0000 ug | ORAL_TABLET | Freq: Every day | ORAL | 1 refills | Status: DC

## 2021-04-27 LAB — BMP8+EGFR
BUN/Creatinine Ratio: 22 (ref 12–28)
BUN: 16 mg/dL (ref 8–27)
CO2: 25 mmol/L (ref 20–29)
Calcium: 9.8 mg/dL (ref 8.7–10.3)
Chloride: 102 mmol/L (ref 96–106)
Creatinine, Ser: 0.74 mg/dL (ref 0.57–1.00)
Glucose: 143 mg/dL — ABNORMAL HIGH (ref 70–99)
Potassium: 5.1 mmol/L (ref 3.5–5.2)
Sodium: 140 mmol/L (ref 134–144)
eGFR: 88 mL/min/{1.73_m2} (ref >59)

## 2021-05-23 ENCOUNTER — Encounter: Payer: Self-pay | Admitting: Internal Medicine

## 2021-06-18 ENCOUNTER — Other Ambulatory Visit: Payer: Self-pay | Admitting: Family

## 2021-06-18 DIAGNOSIS — I10 Essential (primary) hypertension: Secondary | ICD-10-CM

## 2021-06-18 DIAGNOSIS — E1165 Type 2 diabetes mellitus with hyperglycemia: Secondary | ICD-10-CM

## 2021-06-29 ENCOUNTER — Encounter: Payer: Self-pay | Admitting: Family

## 2021-06-29 ENCOUNTER — Ambulatory Visit (INDEPENDENT_AMBULATORY_CARE_PROVIDER_SITE_OTHER): Payer: Medicare HMO | Admitting: Family

## 2021-06-29 VITALS — BP 147/74 | HR 89 | Temp 97.0°F | Ht 58.5 in | Wt 187.4 lb

## 2021-06-29 DIAGNOSIS — E785 Hyperlipidemia, unspecified: Secondary | ICD-10-CM | POA: Diagnosis not present

## 2021-06-29 DIAGNOSIS — K219 Gastro-esophageal reflux disease without esophagitis: Secondary | ICD-10-CM

## 2021-06-29 DIAGNOSIS — M159 Polyosteoarthritis, unspecified: Secondary | ICD-10-CM | POA: Diagnosis not present

## 2021-06-29 DIAGNOSIS — E039 Hypothyroidism, unspecified: Secondary | ICD-10-CM | POA: Diagnosis not present

## 2021-06-29 DIAGNOSIS — Z23 Encounter for immunization: Secondary | ICD-10-CM

## 2021-06-29 DIAGNOSIS — F3341 Major depressive disorder, recurrent, in partial remission: Secondary | ICD-10-CM

## 2021-06-29 DIAGNOSIS — I10 Essential (primary) hypertension: Secondary | ICD-10-CM

## 2021-06-29 DIAGNOSIS — E1169 Type 2 diabetes mellitus with other specified complication: Secondary | ICD-10-CM

## 2021-06-29 DIAGNOSIS — R69 Illness, unspecified: Secondary | ICD-10-CM | POA: Diagnosis not present

## 2021-06-29 LAB — BAYER DCA HB A1C WAIVED: HB A1C (BAYER DCA - WAIVED): 6.6 % — ABNORMAL HIGH (ref 4.8–5.6)

## 2021-06-29 MED ORDER — LISINOPRIL 10 MG PO TABS: 10.0000 mg | ORAL_TABLET | Freq: Every day | ORAL | 3 refills | Status: DC

## 2021-06-29 NOTE — Patient Instructions (Signed)

## 2021-06-29 NOTE — Progress Notes (Signed)
Subjective:    Patient ID: [name], female    DOB: [birth year], 71 y.o.   MRN: [id number]  Chief Complaint  Patient presents with   Hypothyroidism   Pt presents to the office today for chronic follow up. She reports her sister-in- law, sister, and nephew have all passed away end of Jul 27, 2019. She reports her anxiety and depression is worse because of this.  Thyroid Problem Presents for follow-up visit. Symptoms include constipation. Patient reports no diarrhea, fatigue or tremors. The symptoms have been stable. Her past medical history is significant for hyperlipidemia.  Hypertension This is a chronic problem. The current episode started more than 1 year ago. The problem has been waxing and waning since onset. The problem is uncontrolled. Associated symptoms include blurred vision. Pertinent negatives include no malaise/fatigue, peripheral edema or shortness of breath. Risk factors for coronary artery disease include dyslipidemia, diabetes mellitus and obesity. The current treatment provides moderate improvement. Identifiable causes of hypertension include a thyroid problem.  Gastroesophageal Reflux She complains of belching and heartburn. This is a chronic problem. The current episode started more than 1 year ago. The problem occurs occasionally. The problem has been waxing and waning. Pertinent negatives include no fatigue. She has tried a PPI for the symptoms. The treatment provided moderate relief.  Hyperlipidemia This is a chronic problem. The current episode started more than 1 year ago. The problem is controlled. Exacerbating diseases include obesity. Pertinent negatives include no shortness of breath. Current antihyperlipidemic treatment includes statins. The current treatment provides moderate improvement of lipids. Risk factors for coronary artery disease include dyslipidemia, diabetes mellitus, hypertension and a sedentary lifestyle.  Diabetes She presents for her follow-up diabetic  visit. She has type 2 diabetes mellitus. There are no hypoglycemic associated symptoms. Pertinent negatives for hypoglycemia include no tremors. Associated symptoms include blurred vision. Pertinent negatives for diabetes include no fatigue and no foot paresthesias. Symptoms are stable. Pertinent negatives for diabetic complications include no heart disease. Risk factors for coronary artery disease include dyslipidemia, diabetes mellitus, hypertension and sedentary lifestyle. She is following a generally healthy diet. Her overall blood glucose range is 110-130 mg/dl. Eye exam is current.  Arthritis Presents for follow-up visit. She complains of pain. The symptoms have been stable. Affected locations include the right knee, left knee, right MCP and left MCP. Her pain is at a severity of 4/10. Pertinent negatives include no diarrhea or fatigue.  Depression        This is a chronic problem.  The current episode started more than 1 year ago.   Associated symptoms include helplessness, hopelessness, restlessness, indigestion and sad.  Associated symptoms include no fatigue.  Past treatments include SSRIs - Selective serotonin reuptake inhibitors.  Past medical history includes thyroid problem.      Review of Systems  Constitutional:  Negative for fatigue and malaise/fatigue.  Eyes:  Positive for blurred vision.  Respiratory:  Negative for shortness of breath.   Gastrointestinal:  Positive for constipation and heartburn. Negative for diarrhea.  Musculoskeletal:  Positive for arthritis.  Neurological:  Negative for tremors.  Psychiatric/Behavioral:  Positive for depression.   All other systems reviewed and are negative.     Objective:   Physical Exam Vitals reviewed.  Constitutional:      General: She is not in acute distress.    Appearance: She is well-developed.  HENT:     Head: Normocephalic and atraumatic.     Right Ear: Tympanic membrane normal.     Left Ear:  Tympanic membrane normal.   Eyes:     Pupils: Pupils are equal, round, and reactive to light.  Neck:     Thyroid: No thyromegaly.  Cardiovascular:     Rate and Rhythm: Normal rate and regular rhythm.     Heart sounds: Normal heart sounds. No murmur heard. Pulmonary:     Effort: Pulmonary effort is normal. No respiratory distress.     Breath sounds: Normal breath sounds. No wheezing.  Abdominal:     General: Bowel sounds are normal. There is no distension.     Palpations: Abdomen is soft.     Tenderness: There is no abdominal tenderness.  Musculoskeletal:        General: No tenderness. Normal range of motion.     Cervical back: Normal range of motion and neck supple.  Skin:    General: Skin is warm and dry.  Neurological:     Mental Status: She is alert and oriented to person, place, and time.     Cranial Nerves: No cranial nerve deficit.     Deep Tendon Reflexes: Reflexes are normal and symmetric.  Psychiatric:        Behavior: Behavior normal.        Thought Content: Thought content normal.        Judgment: Judgment normal.      BP (!) 144/78    Pulse 89    Temp (!) 97 F (36.1 C) (Temporal)    Ht 4' 10.5" (1.486 m)    Wt 187 lb 6.4 oz (85 kg)    BMI 38.50 kg/m      Assessment & Plan:  [name] comes in today with chief complaint of Hypothyroidism   Diagnosis and orders addressed:  1. Essential hypertension Will increase Lisinopril to 10 mg from 2.5 mg  RTO in 1 month - CMP14+EGFR - CBC with Differential/Platelet - lisinopril (ZESTRIL) 10 MG tablet; Take 1 tablet (10 mg total) by mouth daily.  Dispense: 90 tablet; Refill: 3  2. Gastroesophageal reflux disease, unspecified whether esophagitis present - CMP14+EGFR - CBC with Differential/Platelet  3. Recurrent major depressive disorder, in partial remission (HCC) - CMP14+EGFR - CBC with Differential/Platelet  4. Primary osteoarthritis involving multiple joints - CMP14+EGFR - CBC with Differential/Platelet  5. Hypothyroidism,  unspecified type - CMP14+EGFR - CBC with Differential/Platelet - TSH  6. Hyperlipidemia associated with type 2 diabetes mellitus (HCC) - CMP14+EGFR - CBC with Differential/Platelet  7. Type 2 diabetes mellitus with other specified complication, without long-term current use of insulin (HCC) - Bayer DCA Hb A1c Waived - CMP14+EGFR - CBC with Differential/Platelet   Labs pending Health Maintenance reviewed Diet and exercise encouraged  Follow up plan:  1 month for HTN   Evelina Dun, FNP

## 2021-06-30 ENCOUNTER — Other Ambulatory Visit: Payer: Self-pay | Admitting: Family

## 2021-06-30 DIAGNOSIS — F3341 Major depressive disorder, recurrent, in partial remission: Secondary | ICD-10-CM

## 2021-06-30 LAB — CBC WITH DIFFERENTIAL/PLATELET
Basophils Absolute: 0.1 10*3/uL (ref 0.0–0.2)
Basos: 1 %
EOS (ABSOLUTE): 0.8 10*3/uL — ABNORMAL HIGH (ref 0.0–0.4)
Eos: 9 %
Hematocrit: 37.3 % (ref 34.0–46.6)
Hemoglobin: 12.3 g/dL (ref 11.1–15.9)
Immature Grans (Abs): 0 10*3/uL (ref 0.0–0.1)
Immature Granulocytes: 0 %
Lymphocytes Absolute: 2.5 10*3/uL (ref 0.7–3.1)
Lymphs: 27 %
MCH: 29.4 pg (ref 26.6–33.0)
MCHC: 33 g/dL (ref 31.5–35.7)
MCV: 89 fL (ref 79–97)
Monocytes Absolute: 0.5 10*3/uL (ref 0.1–0.9)
Monocytes: 5 %
Neutrophils Absolute: 5.4 10*3/uL (ref 1.4–7.0)
Neutrophils: 58 %
Platelets: 289 10*3/uL (ref 150–450)
RBC: 4.19 x10E6/uL (ref 3.77–5.28)
RDW: 13.8 % (ref 11.7–15.4)
WBC: 9.5 10*3/uL (ref 3.4–10.8)

## 2021-06-30 LAB — CMP14+EGFR
ALT: 13 IU/L (ref 0–32)
AST: 18 IU/L (ref 0–40)
Albumin/Globulin Ratio: 2 (ref 1.2–2.2)
Albumin: 4.3 g/dL (ref 3.8–4.8)
Alkaline Phosphatase: 64 IU/L (ref 44–121)
BUN/Creatinine Ratio: 12 (ref 12–28)
BUN: 11 mg/dL (ref 8–27)
Bilirubin Total: 0.5 mg/dL (ref 0.0–1.2)
CO2: 24 mmol/L (ref 20–29)
Calcium: 9.7 mg/dL (ref 8.7–10.3)
Chloride: 100 mmol/L (ref 96–106)
Creatinine, Ser: 0.89 mg/dL (ref 0.57–1.00)
Globulin, Total: 2.2 g/dL (ref 1.5–4.5)
Glucose: 129 mg/dL — ABNORMAL HIGH (ref 70–99)
Potassium: 5.1 mmol/L (ref 3.5–5.2)
Sodium: 142 mmol/L (ref 134–144)
Total Protein: 6.5 g/dL (ref 6.0–8.5)
eGFR: 70 mL/min/{1.73_m2} (ref >59)

## 2021-06-30 LAB — TSH: TSH: 1.74 u[IU]/mL (ref 0.450–4.500)

## 2021-07-28 ENCOUNTER — Other Ambulatory Visit: Payer: Self-pay | Admitting: Family

## 2021-07-28 DIAGNOSIS — F3341 Major depressive disorder, recurrent, in partial remission: Secondary | ICD-10-CM

## 2021-08-03 ENCOUNTER — Other Ambulatory Visit: Payer: Self-pay | Admitting: Gastroenterology

## 2021-08-03 NOTE — Telephone Encounter (Signed)
Needs office visit for further refill ?

## 2021-08-06 ENCOUNTER — Encounter: Payer: Self-pay | Admitting: Internal Medicine

## 2021-08-20 ENCOUNTER — Encounter: Payer: Self-pay | Admitting: Family

## 2021-08-20 ENCOUNTER — Ambulatory Visit (INDEPENDENT_AMBULATORY_CARE_PROVIDER_SITE_OTHER): Payer: Medicare HMO | Admitting: Family

## 2021-08-20 VITALS — BP 138/71 | HR 65 | Temp 98.2°F | Ht 58.5 in | Wt 188.6 lb

## 2021-08-20 DIAGNOSIS — E1169 Type 2 diabetes mellitus with other specified complication: Secondary | ICD-10-CM

## 2021-08-20 DIAGNOSIS — E785 Hyperlipidemia, unspecified: Secondary | ICD-10-CM

## 2021-08-20 DIAGNOSIS — K219 Gastro-esophageal reflux disease without esophagitis: Secondary | ICD-10-CM | POA: Diagnosis not present

## 2021-08-20 DIAGNOSIS — E039 Hypothyroidism, unspecified: Secondary | ICD-10-CM | POA: Diagnosis not present

## 2021-08-20 DIAGNOSIS — R69 Illness, unspecified: Secondary | ICD-10-CM | POA: Diagnosis not present

## 2021-08-20 DIAGNOSIS — F3341 Major depressive disorder, recurrent, in partial remission: Secondary | ICD-10-CM

## 2021-08-20 DIAGNOSIS — M159 Polyosteoarthritis, unspecified: Secondary | ICD-10-CM

## 2021-08-20 DIAGNOSIS — I10 Essential (primary) hypertension: Secondary | ICD-10-CM | POA: Diagnosis not present

## 2021-08-20 MED ORDER — BUPROPION HCL ER (XL) 300 MG PO TB24
300.0000 mg | ORAL_TABLET | Freq: Every day | ORAL | 1 refills | Status: DC
Start: 2021-08-20 — End: 2022-02-12

## 2021-08-20 NOTE — Patient Instructions (Signed)

## 2021-08-20 NOTE — Progress Notes (Signed)
? ?Subjective:  ? ? Patient ID: [name], female    DOB: 29-Apr-1951, 71 y.o.   MRN: [id number] ? ?Chief Complaint  ?Patient presents with  ? Medical Management of Chronic Issues  ? ?Pt presents to the office today for HTN follow up. She was seen last month and we increased her Lisinopril to 10 mg from 2.5. Her BP is at goal. ? ? She reports her sister-in- law, sister, and nephew have all passed away end of 07-15-19. She reports her anxiety and depression is worse because of this.  ?Hypertension ?This is a chronic problem. The current episode started more than 1 year ago. The problem has been resolved since onset. The problem is controlled. Associated symptoms include blurred vision and malaise/fatigue. Pertinent negatives include no peripheral edema or shortness of breath. Risk factors for coronary artery disease include dyslipidemia, obesity and sedentary lifestyle. The current treatment provides moderate improvement. Identifiable causes of hypertension include a thyroid problem.  ?Gastroesophageal Reflux ?She complains of belching and heartburn. She reports no hoarse voice. This is a chronic problem. The current episode started more than 1 year ago. The problem occurs occasionally. Associated symptoms include fatigue. Risk factors include obesity. She has tried a PPI for the symptoms. The treatment provided moderate relief.  ?Thyroid Problem ?Presents for follow-up visit. Symptoms include fatigue. Patient reports no diarrhea or hoarse voice. The symptoms have been stable. Her past medical history is significant for hyperlipidemia.  ?Hyperlipidemia ?This is a chronic problem. The current episode started more than 1 year ago. The problem is controlled. Exacerbating diseases include obesity. Pertinent negatives include no shortness of breath. Current antihyperlipidemic treatment includes statins. The current treatment provides moderate improvement of lipids. Risk factors for coronary artery disease include  dyslipidemia, hypertension, a sedentary lifestyle and post-menopausal.  ?Diabetes ?She presents for her follow-up diabetic visit. She has type 2 diabetes mellitus. There are no hypoglycemic associated symptoms. Associated symptoms include blurred vision and fatigue. Pertinent negatives for diabetes include no foot paresthesias. Symptoms are stable. Diabetic complications include heart disease. Pertinent negatives for diabetic complications include no peripheral neuropathy. Risk factors for coronary artery disease include dyslipidemia, diabetes mellitus, hypertension, sedentary lifestyle and post-menopausal. She is following a generally unhealthy diet. Her overall blood glucose range is 130-140 mg/dl. Eye exam is not current.  ?Arthritis ?Presents for follow-up visit. She complains of pain and stiffness. The symptoms have been stable. Affected locations include the left MCP, right MCP, left knee and right knee. Her pain is at a severity of 1/10. Associated symptoms include fatigue. Pertinent negatives include no diarrhea.  ?Depression ?       This is a chronic problem.  The current episode started more than 1 year ago.   The onset quality is gradual.   The problem occurs daily.  Associated symptoms include fatigue, decreased interest and sad.  Associated symptoms include no helplessness, no hopelessness and not irritable.  Past medical history includes thyroid problem.   ? ? ? ?Review of Systems  ?Constitutional:  Positive for fatigue and malaise/fatigue.  ?HENT:  Negative for hoarse voice.   ?Eyes:  Positive for blurred vision.  ?Respiratory:  Negative for shortness of breath.   ?Gastrointestinal:  Positive for heartburn. Negative for diarrhea.  ?Musculoskeletal:  Positive for arthritis and stiffness.  ?Psychiatric/Behavioral:  Positive for depression.   ?All other systems reviewed and are negative. ? ?   ?Objective:  ? Physical Exam ?Vitals reviewed.  ?Constitutional:   ?   General: She is  not irritable.She is not  in acute distress. ?   Appearance: She is well-developed. She is obese.  ?HENT:  ?   Head: Normocephalic and atraumatic.  ?   Right Ear: Tympanic membrane normal.  ?   Left Ear: Tympanic membrane normal.  ?Eyes:  ?   Pupils: Pupils are equal, round, and reactive to light.  ?Neck:  ?   Thyroid: No thyromegaly.  ?Cardiovascular:  ?   Rate and Rhythm: Normal rate and regular rhythm.  ?   Heart sounds: Normal heart sounds. No murmur heard. ?Pulmonary:  ?   Effort: Pulmonary effort is normal. No respiratory distress.  ?   Breath sounds: Normal breath sounds. No wheezing.  ?Abdominal:  ?   General: Bowel sounds are normal. There is no distension.  ?   Palpations: Abdomen is soft.  ?   Tenderness: There is no abdominal tenderness.  ?Musculoskeletal:     ?   General: No tenderness. Normal range of motion.  ?   Cervical back: Normal range of motion and neck supple.  ?Skin: ?   General: Skin is warm and dry.  ?Neurological:  ?   Mental Status: She is alert and oriented to person, place, and time.  ?   Cranial Nerves: No cranial nerve deficit.  ?   Deep Tendon Reflexes: Reflexes are normal and symmetric.  ?Psychiatric:     ?   Behavior: Behavior normal.     ?   Thought Content: Thought content normal.     ?   Judgment: Judgment normal.  ? ? ? ? ?BP 138/71   Pulse 65   Temp 98.2 ?F (36.8 ?C) (Temporal)   Ht 4' 10.5" (1.486 m)   Wt 188 lb 9.6 oz (85.5 kg)   BMI 38.75 kg/m?  ? ?   ?Assessment & Plan:  ?[name] comes in today with chief complaint of Medical Management of Chronic Issues ? ? ?Diagnosis and orders addressed: ? ?1. Essential hypertension ?Continue Lisinopril 20 mg  ?- BMP8+EGFR ? ?2. Gastroesophageal reflux disease, unspecified whether esophagitis present ?- BMP8+EGFR ? ?3. Hypothyroidism, unspecified type ?- BMP8+EGFR ? ?4. Hyperlipidemia associated with type 2 diabetes mellitus ([place]) ?- BMP8+EGFR ? ?5. Type 2 diabetes mellitus with other specified complication, without long-term current use of insulin  ([place]) ?- BMP8+EGFR ? ?6. Recurrent major depressive disorder, in partial remission ([place]) ?Increase Wellbutrin 300 mg  ?Stress management  ?- BMP8+EGFR ?- buPROPion (WELLBUTRIN XL) 300 MG 24 hr tablet; Take 1 tablet (300 mg total) by mouth daily.  Dispense: 90 tablet; Refill: 1 ? ?7. Primary osteoarthritis involving multiple joints ?- BMP8+EGFR ? ? ?Labs pending ?Health Maintenance reviewed ?Diet and exercise encouraged ? ?Follow up plan: ?3 months  ? ?Evelina Dun, FNP ? ? ?

## 2021-08-21 LAB — BMP8+EGFR
BUN/Creatinine Ratio: 27 (ref 12–28)
BUN: 21 mg/dL (ref 8–27)
CO2: 24 mmol/L (ref 20–29)
Calcium: 9.5 mg/dL (ref 8.7–10.3)
Chloride: 100 mmol/L (ref 96–106)
Creatinine, Ser: 0.77 mg/dL (ref 0.57–1.00)
Glucose: 113 mg/dL — ABNORMAL HIGH (ref 70–99)
Potassium: 5.2 mmol/L (ref 3.5–5.2)
Sodium: 140 mmol/L (ref 134–144)
eGFR: 83 mL/min/{1.73_m2} (ref >59)

## 2021-10-12 ENCOUNTER — Ambulatory Visit (INDEPENDENT_AMBULATORY_CARE_PROVIDER_SITE_OTHER): Payer: Medicare HMO

## 2021-10-12 VITALS — Wt 188.0 lb

## 2021-10-12 DIAGNOSIS — Z0000 Encounter for general adult medical examination without abnormal findings: Secondary | ICD-10-CM | POA: Diagnosis not present

## 2021-10-12 NOTE — Progress Notes (Signed)
Subjective:   [name] is a 71 y.o. female who presents for Medicare Annual (Subsequent) preventive examination.  Virtual Visit via Telephone Note  I connected with  [name] on 10/12/21 at  1:15 PM EDT by telephone and verified that I am speaking with the correct person using two identifiers.  Location: Patient: Home Provider: [facility] Persons participating in the virtual visit: patient/Nurse Health Advisor   I discussed the limitations, risks, security and privacy concerns of performing an evaluation and management service by telephone and the availability of in person appointments. The patient expressed understanding and agreed to proceed.  Interactive audio and video telecommunications were attempted between this nurse and patient, however failed, due to patient having technical difficulties OR patient did not have access to video capability.  We continued and completed visit with audio only.  Some vital signs may be absent or patient reported.   Madison Coye E Kasumi Ditullio, LPN   Review of Systems     Cardiac Risk Factors include: advanced age (>51mn, >>38women);diabetes mellitus;dyslipidemia;hypertension;sedentary lifestyle;obesity (BMI >30kg/m2)     Objective:    Today's Vitals   10/12/21 1242  Weight: 188 lb (85.3 kg)   Body mass index is 38.62 kg/m.     10/12/2021   12:46 PM 10/11/2020    2:17 PM 01/25/2020   11:21 AM 01/24/2020    9:14 AM 05/10/2019    3:23 PM 12/16/2018    4:41 PM 12/13/2018    7:27 PM  Advanced Directives  Does Patient Have a Medical Advance Directive? No No No No No No No  Would patient like information on creating a medical advance directive? No - Patient declined Yes (MAU/Ambulatory/Procedural Areas - Information given) No - Patient declined No - Patient declined       Current Medications (verified) Outpatient Encounter Medications as of 10/12/2021  Medication Sig   aspirin EC 81 MG tablet Take 81 mg by mouth daily.   atorvastatin  (LIPITOR) 10 MG tablet Take 1 tablet (10 mg total) by mouth daily.   blood glucose meter kit and supplies KIT Dispense based on patient and insurance preference. Use up to four times daily as directed. (FOR ICD-9 250.00, 250.01).   buPROPion (WELLBUTRIN XL) 300 MG 24 hr tablet Take 1 tablet (300 mg total) by mouth daily.   escitalopram (LEXAPRO) 20 MG tablet Take 1 tablet by mouth once daily   glucose blood test strip Use as instructed   levothyroxine (SYNTHROID) 75 MCG tablet Take 1 tablet (75 mcg total) by mouth daily before breakfast.   lisinopril (ZESTRIL) 10 MG tablet Take 1 tablet (10 mg total) by mouth daily.   metFORMIN (GLUCOPHAGE) 1000 MG tablet Take 1 tablet (1,000 mg total) by mouth 2 (two) times daily with a meal.   ONETOUCH DELICA LANCETS 383MMISC Use to check blood sugars up to 4 times daily   pantoprazole (PROTONIX) 40 MG tablet TAKE 1 TABLET BY MOUTH ONCE DAILY BEFORE BREAKFAST   Semaglutide,0.25 or 0.5MG/DOS, (OZEMPIC, 0.25 OR 0.5 MG/DOSE,) 2 MG/1.5ML SOPN Inject 0.5 mg into the skin every Sunday at 6pm.   No facility-administered encounter medications on file as of 10/12/2021.    Allergies (verified) Penicillins, Shrimp [shellfish allergy], and Sulfa antibiotics   History: Past Medical History:  Diagnosis Date   Arthritis    Depression    ONGOING PROBLEM   Diabetes mellitus without complication ([place]    DX 2008   USES PILLS   Dizziness and giddiness 06/29/2015  GERD (gastroesophageal reflux disease)    Hypertension    Hypothyroidism    Morbidly obese (HCC)    Morton's neuroma of left foot    Past Surgical History:  Procedure Laterality Date   CARPAL TUNNEL RELEASE     LEFT   CARPAL TUNNEL RELEASE Right 12/22/2012   Procedure: CARPAL TUNNEL RELEASE- RIGHT;  Surgeon: Meredith Pel, MD;  Location: [place];  Service: Orthopedics;  Laterality: Right;   COLONOSCOPY WITH PROPOFOL N/A 01/25/2020   [place]: 6 polyps removed, tubular adenomas.  Next colonoscopy in 3  years.   POLYPECTOMY  01/25/2020   Procedure: POLYPECTOMY;  Surgeon: Daneil Dolin, MD;  [facility];  Service: Endoscopy;;   SHOULDER ARTHROSCOPY W/ ROTATOR CUFF REPAIR     LEFT   SHOULDER ARTHROSCOPY WITH ROTATOR CUFF REPAIR Right 12/22/2012   Procedure: SHOULDER ARTHROSCOPY WITH ROTATOR CUFF REPAIR AND BICEPS TENODESIS REPAIR;  Surgeon: Meredith Pel, MD;  Location: [facility];  Service: Orthopedics;  Laterality: Right;  Right Carpal Tunnel Release, Right Shoulder Arthroscopy, Rotator Cuff Tear Repair, Biceps Tenodesis   TOE SURGERY     LEFT BIG TOE   TUBAL LIGATION     Family History  Problem Relation Age of Onset   Heart attack Mother    Cancer Father        brain   Heart failure Father    Dementia Sister        alzheimers   Heart disease Sister    Diabetes Sister    Hypertension Sister    Hyperlipidemia Sister    Heart failure Sister    Colon cancer Cousin        65   Cancer Brother        Prostate   Leukemia Brother    Breast cancer Neg Hx    Social History   Socioeconomic History   Marital status: Widowed    Spouse name: Not on file   Number of children: 1   Years of education: 6   Highest education level: Not on file  Occupational History   Occupation: disability  Tobacco Use   Smoking status: Never   Smokeless tobacco: Never  Vaping Use   Vaping Use: Never used  Substance and Sexual Activity   Alcohol use: No   Drug use: No   Sexual activity: Not on file  Other Topics Concern   Not on file  Social History Narrative   Patient drinks 1 soda daily.   Patient is left handed.    Daughter lives with her   Social Determinants of Health   Financial Resource Strain: Low Risk    Difficulty of Paying Living Expenses: Not hard at all  Food Insecurity: No Food Insecurity   Worried About Charity fundraiser in the Last Year: Never true   Arboriculturist in the Last Year: Never true  Transportation Needs: No Transportation Needs   Lack of  Transportation (Medical): No   Lack of Transportation (Non-Medical): No  Physical Activity: Inactive   Days of Exercise per Week: 0 days   Minutes of Exercise per Session: 0 min  Stress: No Stress Concern Present   Feeling of Stress : Not at all  Social Connections: Moderately Integrated   Frequency of Communication with Friends and Family: More than three times a week   Frequency of Social Gatherings with Friends and Family: More than three times a week   Attends Religious Services: More than 4 times per  year   Active Member of Clubs or Organizations: Yes   Attends Archivist Meetings: More than 4 times per year   Marital Status: Widowed    Tobacco Counseling Counseling given: Not Answered   Clinical Intake:  Pre-visit preparation completed: Yes  Pain : No/denies pain     BMI - recorded: 38.62 Nutritional Status: BMI > 30  Obese Nutritional Risks: None Diabetes: Yes CBG done?: No Did pt. bring in CBG monitor from home?: No  How often do you need to have someone help you when you read instructions, pamphlets, or other written materials from your doctor or pharmacy?: 1 - Never  Diabetic? Nutrition Risk Assessment:  Has the patient had any N/V/D within the last 2 months?  No  Does the patient have any non-healing wounds?  No  Has the patient had any unintentional weight loss or weight gain?  No   Diabetes:  Is the patient diabetic?  Yes  If diabetic, was a CBG obtained today?  No  Did the patient bring in their glucometer from home?  No  How often do you monitor your CBG's? Once daily usually.   Financial Strains and Diabetes Management:  Are you having any financial strains with the device, your supplies or your medication? No .  Does the patient want to be seen by Chronic Care Management for management of their diabetes?  No  Would the patient like to be referred to a Nutritionist or for Diabetic Management?  No   Diabetic Exams:  Diabetic Eye Exam:  Completed 04/03/2021.   Diabetic Foot Exam: Completed 08/20/2021. Pt has been advised about the importance in completing this exam.    Interpreter Needed?: No  Information entered by :: Safiyya Stokes, LPN   Activities of Daily Living    10/12/2021   12:47 PM  In your present state of health, do you have any difficulty performing the following activities:  Hearing? 0  Vision? 0  Difficulty concentrating or making decisions? 0  Walking or climbing stairs? 0  Dressing or bathing? 0  Doing errands, shopping? 0  Preparing Food and eating ? N  Using the Toilet? N  In the past six months, have you accidently leaked urine? N  Do you have problems with loss of bowel control? N  Managing your Medications? N  Managing your Finances? N  Housekeeping or managing your Housekeeping? N    Patient Care Team: Sharion Balloon, FNP as PCP - General (Family Medicine) Lavera Guise, [facility] (Pharmacist) Gala Romney Cristopher Estimable, MD as Consulting Physician (Gastroenterology)  Indicate any recent Medical Services you may have received from other than [facility] providers in the past year (date may be approximate).     Assessment:   This is a routine wellness examination for [name].  Hearing/Vision screen Hearing Screening - Comments:: Denies hearing difficulties  - she does have chronic tinnitus Vision Screening - Comments:: Wears rx glasses - up to date with routine eye exams with [facility]  Dietary issues and exercise activities discussed: Current Exercise Habits: Home exercise routine, Type of exercise: walking, Time (Minutes): 10, Frequency (Times/Week): 7, Weekly Exercise (Minutes/Week): 70, Intensity: Mild, Exercise limited by: orthopedic condition(s)   Goals Addressed             This Visit's Progress    Patient Stated       Exercise more, be more active, lose weight, feel better       Depression Screen    10/12/2021  12:46 PM 08/20/2021   10:52 AM 06/29/2021   10:40 AM 04/24/2021    10:38 AM 02/19/2021   11:07 AM 11/16/2020   11:00 AM 10/11/2020    2:10 PM  PHQ 2/9 Scores  PHQ - 2 Score 1 1 0 0 '2 5 3  ' PHQ- 9 Score '6 10 6  8 18 12    ' Fall Risk    10/12/2021   12:43 PM 08/20/2021   10:49 AM 04/24/2021   10:38 AM 11/16/2020   10:59 AM 10/11/2020    2:12 PM  Fall Risk   Falls in the past year? 0 0 0 0 0  Number falls in past yr: 0    0  Injury with Fall? 0    0  Risk for fall due to : Orthopedic patient    Orthopedic patient;Impaired balance/gait;Impaired vision;Medication side effect  Follow up Falls prevention discussed    Education provided;Falls prevention discussed    FALL RISK PREVENTION PERTAINING TO THE HOME:  Any stairs in or around the home? No  If so, are there any without handrails? No  Home free of loose throw rugs in walkways, pet beds, electrical cords, etc? Yes  Adequate lighting in your home to reduce risk of falls? Yes   ASSISTIVE DEVICES UTILIZED TO PREVENT FALLS:  Life alert? No  Use of a cane, walker or w/c? No  Grab bars in the bathroom? No  Shower chair or bench in shower? No  Elevated toilet seat or a handicapped toilet? No   TIMED UP AND GO:  Was the test performed? No . Telephonic visit  Cognitive Function:        10/12/2021   12:48 PM 10/11/2020    2:15 PM  6CIT Screen  What Year? 0 points 0 points  What month? 3 points 0 points  What time? 0 points 0 points  Count back from 20 0 points 0 points  Months in reverse 4 points 0 points  Repeat phrase 2 points 0 points  Total Score 9 points 0 points    Immunizations Immunization History  Administered Date(s) Administered   Fluad Quad(high Dose 65+) 02/16/2020, 02/19/2021   Influenza Split 03/27/2017   Influenza, High Dose Seasonal PF 06/01/2018, 02/25/2019   Influenza,inj,quad, With Preservative 03/21/2017   Moderna Sars-Covid-2 Vaccination 08/31/2019, 09/28/2019   Pneumococcal Conjugate-13 08/07/2017   Pneumococcal Polysaccharide-23 05/07/2019   Tdap 12/13/2018    Zoster Recombinat (Shingrix) 06/29/2021    TDAP status: Up to date  Flu Vaccine status: Up to date  Pneumococcal vaccine status: Up to date  Covid-19 vaccine status: Completed vaccines  Qualifies for Shingles Vaccine? Yes   Zostavax completed Yes   Shingrix Completed?: No.    Education has been provided regarding the importance of this vaccine. Patient has been advised to call insurance company to determine out of pocket expense if they have not yet received this vaccine. Advised may also receive vaccine at local pharmacy or Health Dept. Verbalized acceptance and understanding.  Screening Tests Health Maintenance  Topic Date Due   COVID-19 Vaccine (3 - Booster for Moderna series) 11/23/2019   Zoster Vaccines- Shingrix (2 of 2) 08/24/2021   INFLUENZA VACCINE  12/25/2021   HEMOGLOBIN A1C  12/27/2021   OPHTHALMOLOGY EXAM  04/03/2022   FOOT EXAM  08/21/2022   MAMMOGRAM  04/03/2023   TETANUS/TDAP  12/12/2028   COLONOSCOPY (Pts 45-64yr Insurance coverage will need to be confirmed)  01/24/2030   Pneumonia Vaccine 69 Years old  Completed  DEXA SCAN  Completed   Hepatitis C Screening  Completed   HPV VACCINES  Aged Out    Health Maintenance  Health Maintenance Due  Topic Date Due   COVID-19 Vaccine (3 - Booster for Moderna series) 11/23/2019   Zoster Vaccines- Shingrix (2 of 2) 08/24/2021    Colorectal cancer screening: Type of screening: Colonoscopy. Completed 01/25/2020. Repeat every 3 years  Mammogram status: Completed 04/02/2021. Repeat every year  Bone Density status: Completed 03/28/2020. Results reflect: Bone density results: OSTEOPENIA. Repeat every 2 years.  Lung Cancer Screening: (Low Dose CT Chest recommended if Age 60-80 years, 30 pack-year currently smoking OR have quit w/in 15years.) does not qualify.   Additional Screening:  Hepatitis C Screening: does qualify; Completed 11/01/2019  Vision Screening: Recommended annual ophthalmology exams for early detection  of glaucoma and other disorders of the eye. Is the patient up to date with their annual eye exam?  Yes  Who is the provider or what is the name of the office in which the patient attends annual eye exams? [facility] If pt is not established with a provider, would they like to be referred to a provider to establish care? No .   Dental Screening: Recommended annual dental exams for proper oral hygiene  Community Resource Referral / Chronic Care Management: CRR required this visit?  No   CCM required this visit?  No      Plan:     I have personally reviewed and noted the following in the patient's chart:   Medical and social history Use of alcohol, tobacco or illicit drugs  Current medications and supplements including opioid prescriptions.  Functional ability and status Nutritional status Physical activity Advanced directives List of other physicians Hospitalizations, surgeries, and ER visits in previous 12 months Vitals Screenings to include cognitive, depression, and falls Referrals and appointments  In addition, I have reviewed and discussed with patient certain preventive protocols, quality metrics, and best practice recommendations. A written personalized care plan for preventive services as well as general preventive health recommendations were provided to patient.     Sandrea Hammond, LPN   12/27/2120   Nurse Notes: 6CIT = 9

## 2021-10-12 NOTE — Patient Instructions (Signed)
[name] , Thank you for taking time to come for your Medicare Wellness Visit. I appreciate your ongoing commitment to your health goals. Please review the following plan we discussed and let me know if I can assist you in the future.   Screening recommendations/referrals: Colonoscopy: Done 01/25/2020 - repeat in 3 years Mammogram: Done 04/02/2021 - Repeat annually  Bone Density: Done 03/28/2020 - Repeat every 2 years  Recommended yearly ophthalmology/optometry visit for glaucoma screening and checkup Recommended yearly dental visit for hygiene and checkup  Vaccinations: Influenza vaccine: Done 02/19/2021 - Repeat annually  Pneumococcal vaccine: Done  08/07/2017 & 05/07/2019 Tdap vaccine: Done 12/03/2018 -Repeat in 10 years Shingles vaccine: Done 06/29/2021 - get second dose at next visit   Covid-19:Done 08/31/2019 & 09/28/2019 - for boosters, contact pharmacy  Advanced directives: Please bring a copy of your health care power of attorney and living will to the office to be added to your chart at your convenience.   Conditions/risks identified: Aim for 30 minutes of exercise or brisk walking, 6-8 glasses of water, and 5 servings of fruits and vegetables each day.   Next appointment: Follow up in one year for your annual wellness visit    Preventive Care 65 Years and Older, Female Preventive care refers to lifestyle choices and visits with your health care provider that can promote health and wellness. What does preventive care include? A yearly physical exam. This is also called an annual well check. Dental exams once or twice a year. Routine eye exams. Ask your health care provider how often you should have your eyes checked. Personal lifestyle choices, including: Daily care of your teeth and gums. Regular physical activity. Eating a healthy diet. Avoiding tobacco and drug use. Limiting alcohol use. Practicing safe sex. Taking low-dose aspirin every day. Taking vitamin and mineral  supplements as recommended by your health care provider. What happens during an annual well check? The services and screenings done by your health care provider during your annual well check will depend on your age, overall health, lifestyle risk factors, and family history of disease. Counseling  Your health care provider may ask you questions about your: Alcohol use. Tobacco use. Drug use. Emotional well-being. Home and relationship well-being. Sexual activity. Eating habits. History of falls. Memory and ability to understand (cognition). Work and work Statistician. Reproductive health. Screening  You may have the following tests or measurements: Height, weight, and BMI. Blood pressure. Lipid and cholesterol levels. These may be checked every 5 years, or more frequently if you are over 65 years old. Skin check. Lung cancer screening. You may have this screening every year starting at age 12 if you have a 30-pack-year history of smoking and currently smoke or have quit within the past 15 years. Fecal occult blood test (FOBT) of the stool. You may have this test every year starting at age 26. Flexible sigmoidoscopy or colonoscopy. You may have a sigmoidoscopy every 5 years or a colonoscopy every 10 years starting at age 10. Hepatitis C blood test. Hepatitis B blood test. Sexually transmitted disease (STD) testing. Diabetes screening. This is done by checking your blood sugar (glucose) after you have not eaten for a while (fasting). You may have this done every 1-3 years. Bone density scan. This is done to screen for osteoporosis. You may have this done starting at age 2. Mammogram. This may be done every 1-2 years. Talk to your health care provider about how often you should have regular mammograms. Talk with your health  care provider about your test results, treatment options, and if necessary, the need for more tests. Vaccines  Your health care provider may recommend certain  vaccines, such as: Influenza vaccine. This is recommended every year. Tetanus, diphtheria, and acellular pertussis (Tdap, Td) vaccine. You may need a Td booster every 10 years. Zoster vaccine. You may need this after age 81. Pneumococcal 13-valent conjugate (PCV13) vaccine. One dose is recommended after age 69. Pneumococcal polysaccharide (PPSV23) vaccine. One dose is recommended after age 59. Talk to your health care provider about which screenings and vaccines you need and how often you need them. This information is not intended to replace advice given to you by your health care provider. Make sure you discuss any questions you have with your health care provider. Document Released: 06/09/2015 Document Revised: 01/31/2016 Document Reviewed: 03/14/2015 Elsevier Interactive Patient Education  2017 [place] Prevention in the Home Falls can cause injuries. They can happen to people of all ages. There are many things you can do to make your home safe and to help prevent falls. What can I do on the outside of my home? Regularly fix the edges of walkways and driveways and fix any cracks. Remove anything that might make you trip as you walk through a door, such as a raised step or threshold. Trim any bushes or trees on the path to your home. Use bright outdoor lighting. Clear any walking paths of anything that might make someone trip, such as rocks or tools. Regularly check to see if handrails are loose or broken. Make sure that both sides of any steps have handrails. Any raised decks and porches should have guardrails on the edges. Have any leaves, snow, or ice cleared regularly. Use sand or salt on walking paths during winter. Clean up any spills in your garage right away. This includes oil or grease spills. What can I do in the bathroom? Use night lights. Install grab bars by the toilet and in the tub and shower. Do not use towel bars as grab bars. Use non-skid mats or decals in  the tub or shower. If you need to sit down in the shower, use a plastic, non-slip stool. Keep the floor dry. Clean up any water that spills on the floor as soon as it happens. Remove soap buildup in the tub or shower regularly. Attach bath mats securely with double-sided non-slip rug tape. Do not have throw rugs and other things on the floor that can make you trip. What can I do in the bedroom? Use night lights. Make sure that you have a light by your bed that is easy to reach. Do not use any sheets or blankets that are too big for your bed. They should not hang down onto the floor. Have a firm chair that has side arms. You can use this for support while you get dressed. Do not have throw rugs and other things on the floor that can make you trip. What can I do in the kitchen? Clean up any spills right away. Avoid walking on wet floors. Keep items that you use a lot in easy-to-reach places. If you need to reach something above you, use a strong step stool that has a grab bar. Keep electrical cords out of the way. Do not use floor polish or wax that makes floors slippery. If you must use wax, use non-skid floor wax. Do not have throw rugs and other things on the floor that can make you trip. What can  I do with my stairs? Do not leave any items on the stairs. Make sure that there are handrails on both sides of the stairs and use them. Fix handrails that are broken or loose. Make sure that handrails are as long as the stairways. Check any carpeting to make sure that it is firmly attached to the stairs. Fix any carpet that is loose or worn. Avoid having throw rugs at the top or bottom of the stairs. If you do have throw rugs, attach them to the floor with carpet tape. Make sure that you have a light switch at the top of the stairs and the bottom of the stairs. If you do not have them, ask someone to add them for you. What else can I do to help prevent falls? Wear shoes that: Do not have high  heels. Have rubber bottoms. Are comfortable and fit you well. Are closed at the toe. Do not wear sandals. If you use a stepladder: Make sure that it is fully opened. Do not climb a closed stepladder. Make sure that both sides of the stepladder are locked into place. Ask someone to hold it for you, if possible. Clearly mark and make sure that you can see: Any grab bars or handrails. First and last steps. Where the edge of each step is. Use tools that help you move around (mobility aids) if they are needed. These include: Canes. Walkers. Scooters. Crutches. Turn on the lights when you go into a dark area. Replace any light bulbs as soon as they burn out. Set up your furniture so you have a clear path. Avoid moving your furniture around. If any of your floors are uneven, fix them. If there are any pets around you, be aware of where they are. Review your medicines with your doctor. Some medicines can make you feel dizzy. This can increase your chance of falling. Ask your doctor what other things that you can do to help prevent falls. This information is not intended to replace advice given to you by your health care provider. Make sure you discuss any questions you have with your health care provider. Document Released: 03/09/2009 Document Revised: 10/19/2015 Document Reviewed: 06/17/2014 Elsevier Interactive Patient Education  2017 [employer].

## 2021-10-21 ENCOUNTER — Other Ambulatory Visit: Payer: Self-pay | Admitting: Family

## 2021-10-21 DIAGNOSIS — E1169 Type 2 diabetes mellitus with other specified complication: Secondary | ICD-10-CM

## 2021-11-08 ENCOUNTER — Other Ambulatory Visit: Payer: Self-pay | Admitting: Gastroenterology

## 2021-11-08 NOTE — Telephone Encounter (Signed)
Pt was made aware. Routing to the front to schedule appt.

## 2021-11-08 NOTE — Telephone Encounter (Signed)
Needs office visit.

## 2021-11-09 ENCOUNTER — Encounter: Payer: Self-pay | Admitting: Internal Medicine

## 2022-01-28 ENCOUNTER — Other Ambulatory Visit: Payer: Self-pay | Admitting: Family

## 2022-01-28 DIAGNOSIS — E1169 Type 2 diabetes mellitus with other specified complication: Secondary | ICD-10-CM

## 2022-01-30 ENCOUNTER — Other Ambulatory Visit: Payer: Self-pay | Admitting: Family

## 2022-01-30 DIAGNOSIS — E1165 Type 2 diabetes mellitus with hyperglycemia: Secondary | ICD-10-CM

## 2022-02-12 ENCOUNTER — Encounter: Payer: Self-pay | Admitting: Family

## 2022-02-12 ENCOUNTER — Ambulatory Visit (INDEPENDENT_AMBULATORY_CARE_PROVIDER_SITE_OTHER): Payer: Medicare HMO | Admitting: Family

## 2022-02-12 VITALS — BP 135/71 | HR 68 | Temp 97.3°F | Ht 58.5 in | Wt 193.0 lb

## 2022-02-12 DIAGNOSIS — F3341 Major depressive disorder, recurrent, in partial remission: Secondary | ICD-10-CM | POA: Diagnosis not present

## 2022-02-12 DIAGNOSIS — M159 Polyosteoarthritis, unspecified: Secondary | ICD-10-CM

## 2022-02-12 DIAGNOSIS — Z0000 Encounter for general adult medical examination without abnormal findings: Secondary | ICD-10-CM | POA: Diagnosis not present

## 2022-02-12 DIAGNOSIS — E785 Hyperlipidemia, unspecified: Secondary | ICD-10-CM

## 2022-02-12 DIAGNOSIS — E039 Hypothyroidism, unspecified: Secondary | ICD-10-CM | POA: Diagnosis not present

## 2022-02-12 DIAGNOSIS — Z0001 Encounter for general adult medical examination with abnormal findings: Secondary | ICD-10-CM | POA: Diagnosis not present

## 2022-02-12 DIAGNOSIS — K219 Gastro-esophageal reflux disease without esophagitis: Secondary | ICD-10-CM | POA: Diagnosis not present

## 2022-02-12 DIAGNOSIS — R69 Illness, unspecified: Secondary | ICD-10-CM | POA: Diagnosis not present

## 2022-02-12 DIAGNOSIS — E1169 Type 2 diabetes mellitus with other specified complication: Secondary | ICD-10-CM

## 2022-02-12 DIAGNOSIS — I10 Essential (primary) hypertension: Secondary | ICD-10-CM | POA: Diagnosis not present

## 2022-02-12 DIAGNOSIS — Z23 Encounter for immunization: Secondary | ICD-10-CM | POA: Diagnosis not present

## 2022-02-12 LAB — BAYER DCA HB A1C WAIVED: HB A1C (BAYER DCA - WAIVED): 6.7 % — ABNORMAL HIGH (ref 4.8–5.6)

## 2022-02-12 MED ORDER — SEMAGLUTIDE (1 MG/DOSE) 4 MG/3ML ~~LOC~~ SOPN: 1.0000 mg | PEN_INJECTOR | SUBCUTANEOUS | 2 refills | Status: DC

## 2022-02-12 MED ORDER — METFORMIN HCL 1000 MG PO TABS
1000.0000 mg | ORAL_TABLET | Freq: Two times a day (BID) | ORAL | 0 refills | Status: DC
Start: 2022-02-12 — End: 2022-10-14

## 2022-02-12 MED ORDER — ATORVASTATIN CALCIUM 10 MG PO TABS: 10.0000 mg | ORAL_TABLET | Freq: Every day | ORAL | 3 refills | Status: DC

## 2022-02-12 MED ORDER — ESCITALOPRAM OXALATE 20 MG PO TABS: 20.0000 mg | ORAL_TABLET | Freq: Every day | ORAL | 3 refills | Status: DC

## 2022-02-12 MED ORDER — LISINOPRIL 10 MG PO TABS: 10.0000 mg | ORAL_TABLET | Freq: Every day | ORAL | 3 refills | Status: DC

## 2022-02-12 MED ORDER — BUPROPION HCL ER (XL) 300 MG PO TB24: 300.0000 mg | ORAL_TABLET | Freq: Every day | ORAL | 1 refills | Status: DC

## 2022-02-12 MED ORDER — LEVOTHYROXINE SODIUM 75 MCG PO TABS
75.0000 ug | ORAL_TABLET | Freq: Every day | ORAL | 1 refills | Status: DC
Start: 2022-02-12 — End: 2022-08-28

## 2022-02-12 MED ORDER — OZEMPIC (0.25 OR 0.5 MG/DOSE) 2 MG/1.5ML ~~LOC~~ SOPN: 0.5000 mg | PEN_INJECTOR | SUBCUTANEOUS | 2 refills | Status: DC

## 2022-02-12 NOTE — Progress Notes (Signed)
Subjective:    Patient ID: [name], female    DOB: 11-Dec-1950, 71 y.o.   MRN: [id number]  Chief Complaint  Patient presents with   Medical Management of Chronic Issues    Hair falling out last several months. Flu shot and 2nd shingrix today    Pt presents to the office today for CPE. She is taking Ozempic for diabetes and weight loss.      02/12/2022    9:10 AM 10/12/2021   12:42 PM 08/20/2021   10:50 AM  Last 3 Weights  Weight (lbs) 193 lb 188 lb 188 lb 9.6 oz  Weight (kg) 87.544 kg 85.276 kg 85.548 kg    She is morbid obese with a BMI of 39 and HTN and DM.  Hypertension This is a chronic problem. The current episode started more than 1 year ago. The problem has been resolved since onset. The problem is controlled. Associated symptoms include malaise/fatigue and peripheral edema. Pertinent negatives include no blurred vision or shortness of breath. Risk factors for coronary artery disease include dyslipidemia, obesity and sedentary lifestyle. The current treatment provides moderate improvement. Identifiable causes of hypertension include a thyroid problem.  Gastroesophageal Reflux She complains of belching and heartburn. She reports no coughing or no hoarse voice. This is a chronic problem. The current episode started more than 1 year ago. The problem occurs occasionally. The symptoms are aggravated by certain foods. Risk factors include obesity. She has tried a PPI for the symptoms. The treatment provided moderate relief.  Thyroid Problem Presents for follow-up visit. Symptoms include constipation. Patient reports no anxiety, diarrhea, dry skin or hoarse voice. The symptoms have been stable. Her past medical history is significant for hyperlipidemia.  Hyperlipidemia This is a chronic problem. The current episode started more than 1 year ago. The problem is controlled. Recent lipid tests were reviewed and are normal. Exacerbating diseases include obesity. Pertinent negatives  include no shortness of breath. Current antihyperlipidemic treatment includes statins. The current treatment provides moderate improvement of lipids. Risk factors for coronary artery disease include dyslipidemia, hypertension and a sedentary lifestyle.  Diabetes She presents for her follow-up diabetic visit. She has type 2 diabetes mellitus. Pertinent negatives for hypoglycemia include no nervousness/anxiousness. Pertinent negatives for diabetes include no blurred vision and no foot paresthesias. Symptoms are stable. Risk factors for coronary artery disease include diabetes mellitus, hypertension, post-menopausal, sedentary lifestyle, obesity and dyslipidemia. She is following a generally healthy diet. Her overall blood glucose range is 110-130 mg/dl. Eye exam is not current.  Arthritis Presents for follow-up visit. She complains of pain and stiffness. The symptoms have been stable. Affected locations include the right knee, left knee, left MCP and right MCP. Her pain is at a severity of 2/10. Pertinent negatives include no diarrhea.  Depression        This is a chronic problem.  The current episode started more than 1 year ago.   The onset quality is gradual.   The problem occurs intermittently.  Associated symptoms include decreased interest and sad.  Associated symptoms include no helplessness and no hopelessness.  Past treatments include SSRIs - Selective serotonin reuptake inhibitors.  Past medical history includes thyroid problem.       Review of Systems  Constitutional:  Positive for malaise/fatigue.  HENT:  Negative for hoarse voice.   Eyes:  Negative for blurred vision.  Respiratory:  Negative for cough and shortness of breath.   Gastrointestinal:  Positive for constipation and heartburn. Negative for diarrhea.  Musculoskeletal:  Positive for arthritis and stiffness.  Psychiatric/Behavioral:  Positive for depression. The patient is not nervous/anxious.   All other systems reviewed and are  negative.  Family History  Problem Relation Age of Onset   Heart attack Mother    Cancer Father        brain   Heart failure Father    Dementia Sister        alzheimers   Heart disease Sister    Diabetes Sister    Hypertension Sister    Hyperlipidemia Sister    Heart failure Sister    Colon cancer Cousin        4   Cancer Brother        Prostate   Leukemia Brother    Breast cancer Neg Hx    Social History   Socioeconomic History   Marital status: Widowed    Spouse name: Not on file   Number of children: 1   Years of education: 8   Highest education level: Not on file  Occupational History   Occupation: disability  Tobacco Use   Smoking status: Never   Smokeless tobacco: Never  Vaping Use   Vaping Use: Never used  Substance and Sexual Activity   Alcohol use: No   Drug use: No   Sexual activity: Not on file  Other Topics Concern   Not on file  Social History Narrative   Patient drinks 1 soda daily.   Patient is left handed.    Daughter lives with her   Social Determinants of Health   Financial Resource Strain: Low Risk  (10/12/2021)   Overall Financial Resource Strain (CARDIA)    Difficulty of Paying Living Expenses: Not hard at all  Food Insecurity: No Food Insecurity (10/12/2021)   Hunger Vital Sign    Worried About Running Out of Food in the Last Year: Never true    Ran Out of Food in the Last Year: Never true  Transportation Needs: No Transportation Needs (10/12/2021)   PRAPARE - Hydrologist (Medical): No    Lack of Transportation (Non-Medical): No  Physical Activity: Inactive (10/12/2021)   Exercise Vital Sign    Days of Exercise per Week: 0 days    Minutes of Exercise per Session: 0 min  Stress: No Stress Concern Present (10/12/2021)   [place]    Feeling of Stress : Not at all  Social Connections: Moderately Integrated (10/12/2021)   Social Connection  and Isolation Panel [NHANES]    Frequency of Communication with Friends and Family: More than three times a week    Frequency of Social Gatherings with Friends and Family: More than three times a week    Attends Religious Services: More than 4 times per year    Active Member of [employer] or Organizations: Yes    Attends Archivist Meetings: More than 4 times per year    Marital Status: Widowed       Objective:   Physical Exam Vitals reviewed.  Constitutional:      General: She is not in acute distress.    Appearance: She is well-developed. She is obese.  HENT:     Head: Normocephalic and atraumatic.     Right Ear: Tympanic membrane normal.     Left Ear: Tympanic membrane normal.  Eyes:     Pupils: Pupils are equal, round, and reactive to light.  Neck:     Thyroid: No  thyromegaly.  Cardiovascular:     Rate and Rhythm: Normal rate and regular rhythm.     Heart sounds: Normal heart sounds. No murmur heard. Pulmonary:     Effort: Pulmonary effort is normal. No respiratory distress.     Breath sounds: Normal breath sounds. No wheezing.  Abdominal:     General: Bowel sounds are normal. There is no distension.     Palpations: Abdomen is soft.     Tenderness: There is no abdominal tenderness.  Musculoskeletal:        General: No tenderness. Normal range of motion.     Cervical back: Normal range of motion and neck supple.  Skin:    General: Skin is warm and dry.  Neurological:     Mental Status: She is alert and oriented to person, place, and time.     Cranial Nerves: No cranial nerve deficit.     Deep Tendon Reflexes: Reflexes are normal and symmetric.  Psychiatric:        Behavior: Behavior normal.        Thought Content: Thought content normal.        Judgment: Judgment normal.       BP 135/71   Pulse 68   Temp (!) 97.3 F (36.3 C) (Temporal)   Ht 4' 10.5" (1.486 m)   Wt 193 lb (87.5 kg)   SpO2 99%   BMI 39.65 kg/m      Assessment & Plan:   KALIYAH GLADMAN comes in today with chief complaint of Medical Management of Chronic Issues (Hair falling out last several months. Flu shot and 2nd shingrix today )   Diagnosis and orders addressed:  1. Essential hypertension - lisinopril (ZESTRIL) 10 MG tablet; Take 1 tablet (10 mg total) by mouth daily.  Dispense: 90 tablet; Refill: 3 - CMP14+EGFR - CBC with Differential/Platelet - Semaglutide, 1 MG/DOSE, 4 MG/3ML SOPN; Inject 1 mg as directed once a week.  Dispense: 3 mL; Refill: 2  2. Hyperlipidemia associated with type 2 diabetes mellitus (HCC) - atorvastatin (LIPITOR) 10 MG tablet; Take 1 tablet (10 mg total) by mouth daily.  Dispense: 90 tablet; Refill: 3 - CMP14+EGFR - CBC with Differential/Platelet - Semaglutide, 1 MG/DOSE, 4 MG/3ML SOPN; Inject 1 mg as directed once a week.  Dispense: 3 mL; Refill: 2  3. Recurrent major depressive disorder, in partial remission (HCC) - escitalopram (LEXAPRO) 20 MG tablet; Take 1 tablet (20 mg total) by mouth daily.  Dispense: 90 tablet; Refill: 3 - buPROPion (WELLBUTRIN XL) 300 MG 24 hr tablet; Take 1 tablet (300 mg total) by mouth daily.  Dispense: 90 tablet; Refill: 1 - CMP14+EGFR - CBC with Differential/Platelet  4. Type 2 diabetes mellitus with other specified complication, without long-term current use of insulin (HCC) Will increase Ozempic to 1 mg from 0.5 mg - metFORMIN (GLUCOPHAGE) 1000 MG tablet; Take 1 tablet (1,000 mg total) by mouth 2 (two) times daily with a meal.  Dispense: 180 tablet; Refill: 0 - CMP14+EGFR - CBC with Differential/Platelet - Bayer DCA Hb A1c Waived - Microalbumin / creatinine urine ratio - Semaglutide, 1 MG/DOSE, 4 MG/3ML SOPN; Inject 1 mg as directed once a week.  Dispense: 3 mL; Refill: 2  5. Gastroesophageal reflux disease, unspecified whether esophagitis present - CMP14+EGFR - CBC with Differential/Platelet  6. Hypothyroidism, unspecified type - CMP14+EGFR - CBC with Differential/Platelet - Microalbumin /  creatinine urine ratio - TSH  7. Primary osteoarthritis involving multiple joints - CMP14+EGFR - CBC with Differential/Platelet  8.  Annual physical exam - Lipid panel  9. Morbid obesity (HCC) - Semaglutide, 1 MG/DOSE, 4 MG/3ML SOPN; Inject 1 mg as directed once a week.  Dispense: 3 mL; Refill: 2   Labs pending Health Maintenance reviewed Diet and exercise encouraged  Follow up plan: 3 months    Evelina Dun, FNP

## 2022-02-12 NOTE — Patient Instructions (Signed)
Health Maintenance After Age 69 After age 69, you are at a higher risk for certain long-term diseases and infections as well as injuries from falls. Falls are a major cause of broken bones and head injuries in people who are older than age 69. Getting regular preventive care can help to keep you healthy and well. Preventive care includes getting regular testing and making lifestyle changes as recommended by your health care provider. Talk with your health care provider about: Which screenings and tests you should have. A screening is a test that checks for a disease when you have no symptoms. A diet and exercise plan that is right for you. What should I know about screenings and tests to prevent falls? Screening and testing are the best ways to find a health problem early. Early diagnosis and treatment give you the best chance of managing medical conditions that are common after age 69. Certain conditions and lifestyle choices may make you more likely to have a fall. Your health care provider may recommend: Regular vision checks. Poor vision and conditions such as cataracts can make you more likely to have a fall. If you wear glasses, make sure to get your prescription updated if your vision changes. Medicine review. Work with your health care provider to regularly review all of the medicines you are taking, including over-the-counter medicines. Ask your health care provider about any side effects that may make you more likely to have a fall. Tell your health care provider if any medicines that you take make you feel dizzy or sleepy. Strength and balance checks. Your health care provider may recommend certain tests to check your strength and balance while standing, walking, or changing positions. Foot health exam. Foot pain and numbness, as well as not wearing proper footwear, can make you more likely to have a fall. Screenings, including: Osteoporosis screening. Osteoporosis is a condition that causes  the bones to get weaker and break more easily. Blood pressure screening. Blood pressure changes and medicines to control blood pressure can make you feel dizzy. Depression screening. You may be more likely to have a fall if you have a fear of falling, feel depressed, or feel unable to do activities that you used to do. Alcohol use screening. Using too much alcohol can affect your balance and may make you more likely to have a fall. Follow these instructions at home: Lifestyle Do not drink alcohol if: Your health care provider tells you not to drink. If you drink alcohol: Limit how much you have to: 0-1 drink a day for women. 0-2 drinks a day for men. Know how much alcohol is in your drink. In the [place] one drink equals one 12 oz bottle of beer (355 mL), one 5 oz glass of wine (148 mL), or one 1 oz glass of hard liquor (44 mL). Do not use any products that contain nicotine or tobacco. These products include cigarettes, chewing tobacco, and vaping devices, such as e-cigarettes. If you need help quitting, ask your health care provider. Activity  Follow a regular exercise program to stay fit. This will help you maintain your balance. Ask your health care provider what types of exercise are appropriate for you. If you need a cane or walker, use it as recommended by your health care provider. Wear supportive shoes that have nonskid soles. Safety  Remove any tripping hazards, such as rugs, cords, and clutter. Install safety equipment such as grab bars in bathrooms and safety rails on stairs. Keep rooms and walkways   well-lit. General instructions Talk with your health care provider about your risks for falling. Tell your health care provider if: You fall. Be sure to tell your health care provider about all falls, even ones that seem minor. You feel dizzy, tiredness (fatigue), or off-balance. Take over-the-counter and prescription medicines only as told by your health care provider. These include  supplements. Eat a healthy diet and maintain a healthy weight. A healthy diet includes low-fat dairy products, low-fat (lean) meats, and fiber from whole grains, beans, and lots of fruits and vegetables. Stay current with your vaccines. Schedule regular health, dental, and eye exams. Summary Having a healthy lifestyle and getting preventive care can help to protect your health and wellness after age 69. Screening and testing are the best way to find a health problem early and help you avoid having a fall. Early diagnosis and treatment give you the best chance for managing medical conditions that are more common for people who are older than age 69. Falls are a major cause of broken bones and head injuries in people who are older than age 69. Take precautions to prevent a fall at home. Work with your health care provider to learn what changes you can make to improve your health and wellness and to prevent falls. This information is not intended to replace advice given to you by your health care provider. Make sure you discuss any questions you have with your health care provider. Document Revised: 10/02/2020 Document Reviewed: 10/02/2020 Elsevier Patient Education  2023 Elsevier Inc.  

## 2022-02-13 ENCOUNTER — Other Ambulatory Visit: Payer: Self-pay | Admitting: Gastroenterology

## 2022-02-13 LAB — LIPID PANEL
Chol/HDL Ratio: 2.4 ratio (ref 0.0–4.4)
Cholesterol, Total: 132 mg/dL (ref 100–199)
HDL: 55 mg/dL (ref >39)
LDL Chol Calc (NIH): 57 mg/dL (ref 0–99)
Triglycerides: 112 mg/dL (ref 0–149)
VLDL Cholesterol Cal: 20 mg/dL (ref 5–40)

## 2022-02-13 LAB — CBC WITH DIFFERENTIAL/PLATELET
Basophils Absolute: 0.1 10*3/uL (ref 0.0–0.2)
Basos: 1 %
EOS (ABSOLUTE): 0.7 10*3/uL — ABNORMAL HIGH (ref 0.0–0.4)
Eos: 8 %
Hematocrit: 34.9 % (ref 34.0–46.6)
Hemoglobin: 11.4 g/dL (ref 11.1–15.9)
Immature Grans (Abs): 0.1 10*3/uL (ref 0.0–0.1)
Immature Granulocytes: 1 %
Lymphocytes Absolute: 2.8 10*3/uL (ref 0.7–3.1)
Lymphs: 29 %
MCH: 28.9 pg (ref 26.6–33.0)
MCHC: 32.7 g/dL (ref 31.5–35.7)
MCV: 89 fL (ref 79–97)
Monocytes Absolute: 0.5 10*3/uL (ref 0.1–0.9)
Monocytes: 5 %
Neutrophils Absolute: 5.6 10*3/uL (ref 1.4–7.0)
Neutrophils: 56 %
Platelets: 269 10*3/uL (ref 150–450)
RBC: 3.94 x10E6/uL (ref 3.77–5.28)
RDW: 14.2 % (ref 11.7–15.4)
WBC: 9.8 10*3/uL (ref 3.4–10.8)

## 2022-02-13 LAB — CMP14+EGFR
ALT: 12 IU/L (ref 0–32)
AST: 13 IU/L (ref 0–40)
Albumin/Globulin Ratio: 2.1 (ref 1.2–2.2)
Albumin: 4.4 g/dL (ref 3.9–4.9)
Alkaline Phosphatase: 68 IU/L (ref 44–121)
BUN/Creatinine Ratio: 17 (ref 12–28)
BUN: 17 mg/dL (ref 8–27)
Bilirubin Total: 0.3 mg/dL (ref 0.0–1.2)
CO2: 24 mmol/L (ref 20–29)
Calcium: 9.7 mg/dL (ref 8.7–10.3)
Chloride: 102 mmol/L (ref 96–106)
Creatinine, Ser: 1.01 mg/dL — ABNORMAL HIGH (ref 0.57–1.00)
Globulin, Total: 2.1 g/dL (ref 1.5–4.5)
Glucose: 170 mg/dL — ABNORMAL HIGH (ref 70–99)
Potassium: 5.1 mmol/L (ref 3.5–5.2)
Sodium: 140 mmol/L (ref 134–144)
Total Protein: 6.5 g/dL (ref 6.0–8.5)
eGFR: 60 mL/min/{1.73_m2} (ref >59)

## 2022-02-13 LAB — MICROALBUMIN / CREATININE URINE RATIO
Creatinine, Urine: 160.3 mg/dL
Microalb/Creat Ratio: 16 mg/g creat (ref 0–29)
Microalbumin, Urine: 25.4 ug/mL

## 2022-02-13 LAB — TSH: TSH: 1.37 u[IU]/mL (ref 0.450–4.500)

## 2022-02-14 ENCOUNTER — Other Ambulatory Visit: Payer: Self-pay | Admitting: Family

## 2022-02-18 NOTE — Addendum Note (Signed)
Addended by: Ladean Raya on: 02/18/2022 10:23 AM   Modules accepted: Orders

## 2022-04-04 DIAGNOSIS — H25813 Combined forms of age-related cataract, bilateral: Secondary | ICD-10-CM | POA: Diagnosis not present

## 2022-04-04 DIAGNOSIS — H524 Presbyopia: Secondary | ICD-10-CM | POA: Diagnosis not present

## 2022-04-04 DIAGNOSIS — H5203 Hypermetropia, bilateral: Secondary | ICD-10-CM | POA: Diagnosis not present

## 2022-04-04 DIAGNOSIS — H52223 Regular astigmatism, bilateral: Secondary | ICD-10-CM | POA: Diagnosis not present

## 2022-04-04 LAB — HM DIABETES EYE EXAM

## 2022-04-13 IMAGING — MG MM DIGITAL SCREENING BILAT W/ TOMO AND CAD
6 of 10 series · 6 of 30 positions shown · non-contrast
Comparison: Previous exam(s).

ACR Breast Density Category a: The breast tissue is almost entirely
fatty.

CLINICAL DATA: Screening.

EXAM:
DIGITAL SCREENING BILATERAL MAMMOGRAM WITH TOMOSYNTHESIS AND CAD
TECHNIQUE: Bilateral screening digital craniocaudal and mediolateral oblique
mammograms were obtained. Bilateral screening digital breast
tomosynthesis was performed. The images were evaluated with
computer-aided detection.

[L CC synth-2D]
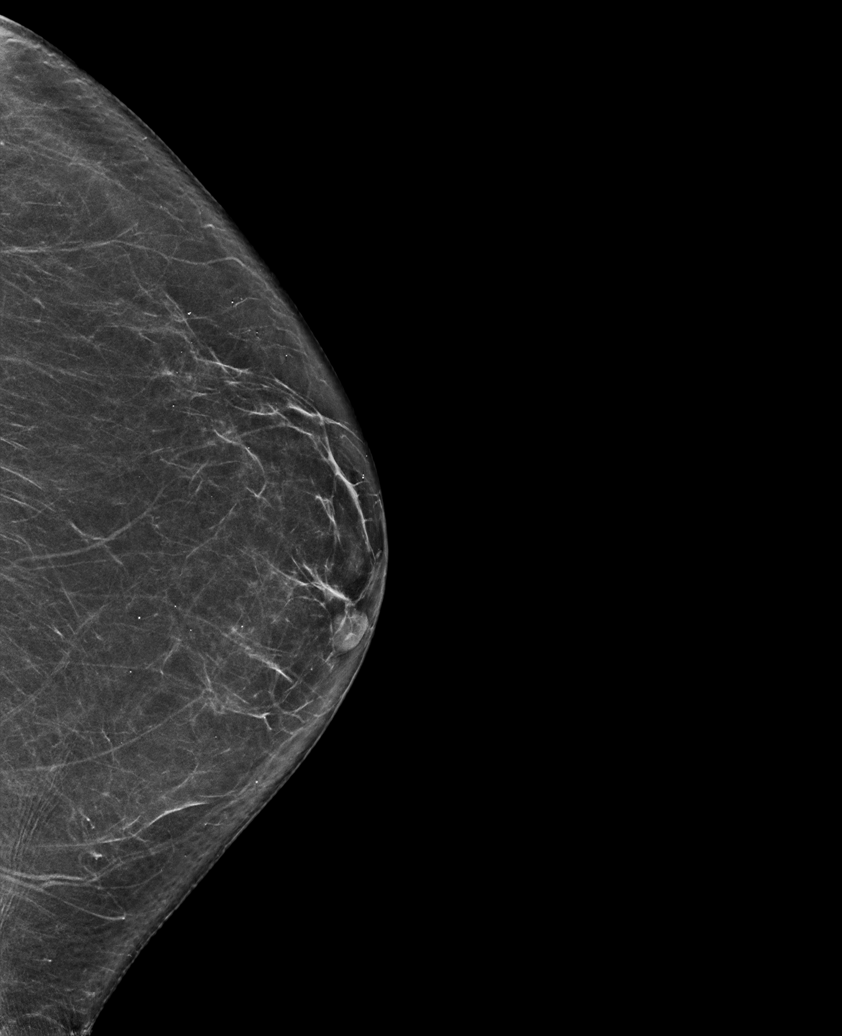

[R CC synth-2D]
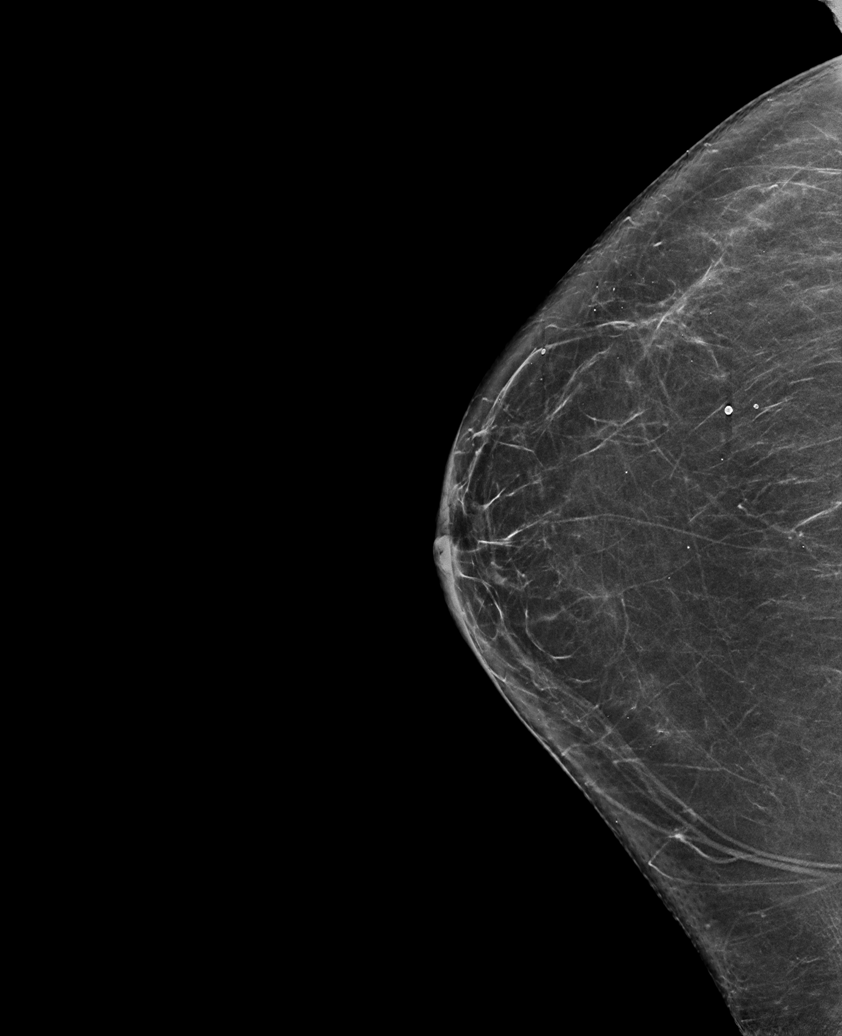

[R MLO synth-2D]
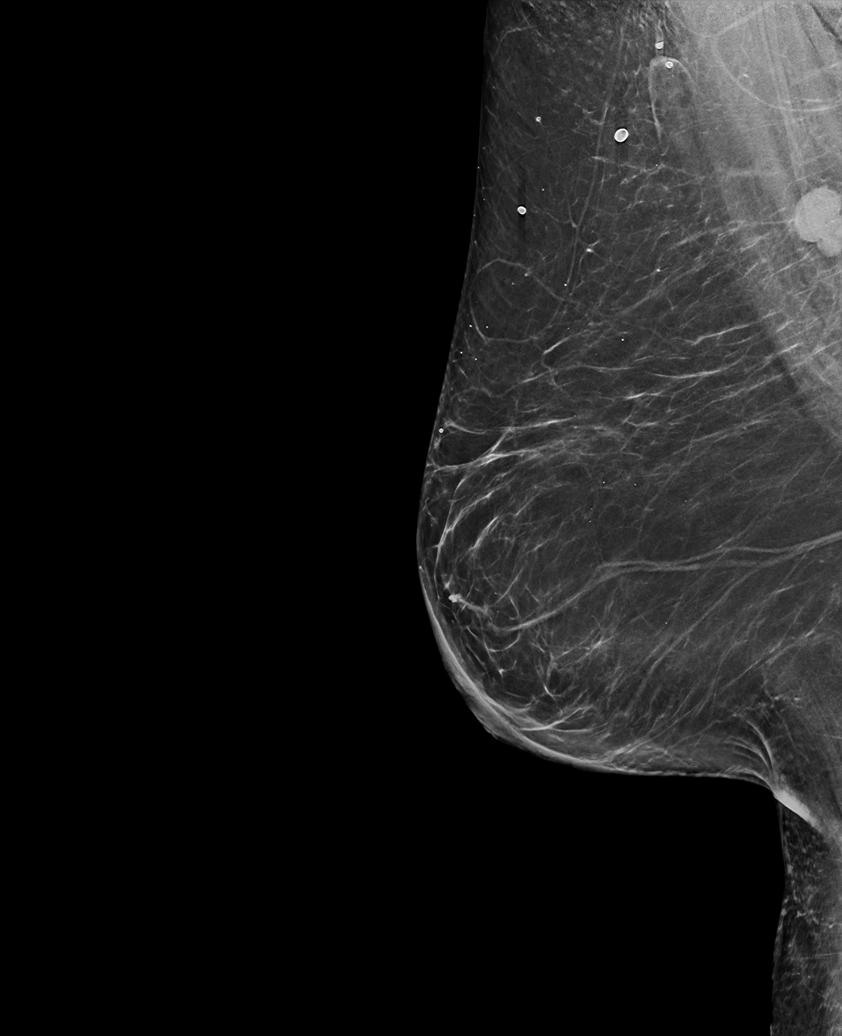

[L MLO synth-2D]
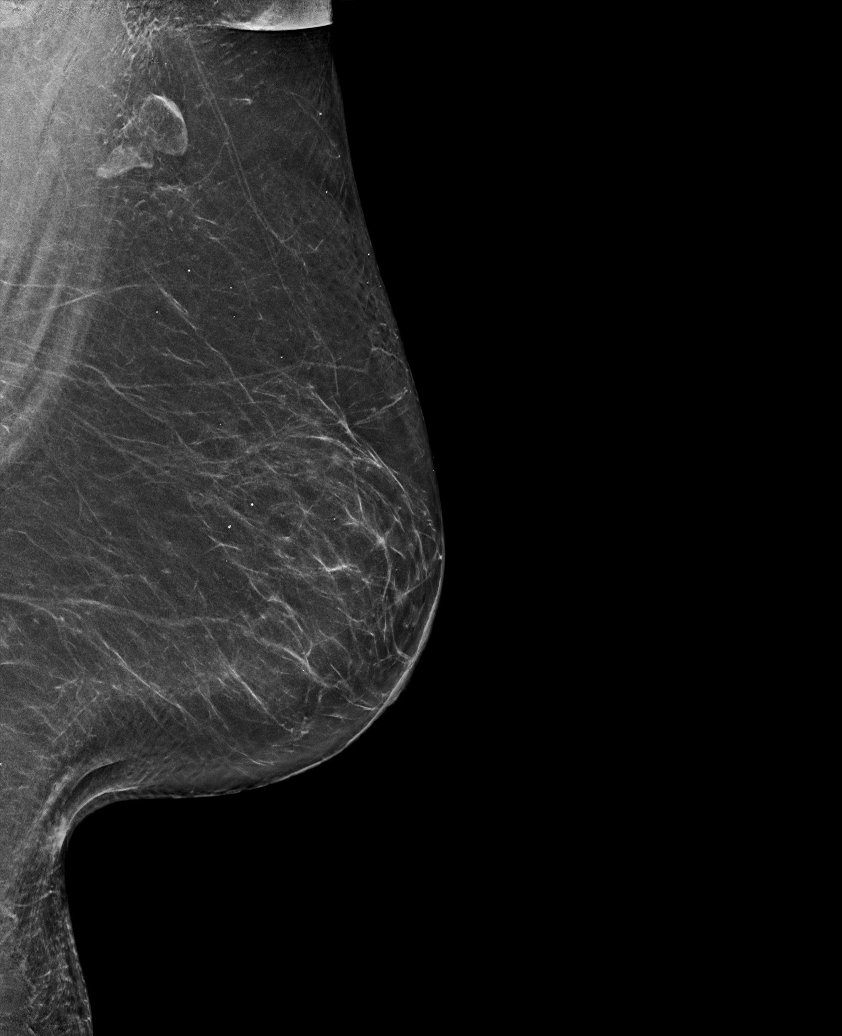

[R CV synth-2D]
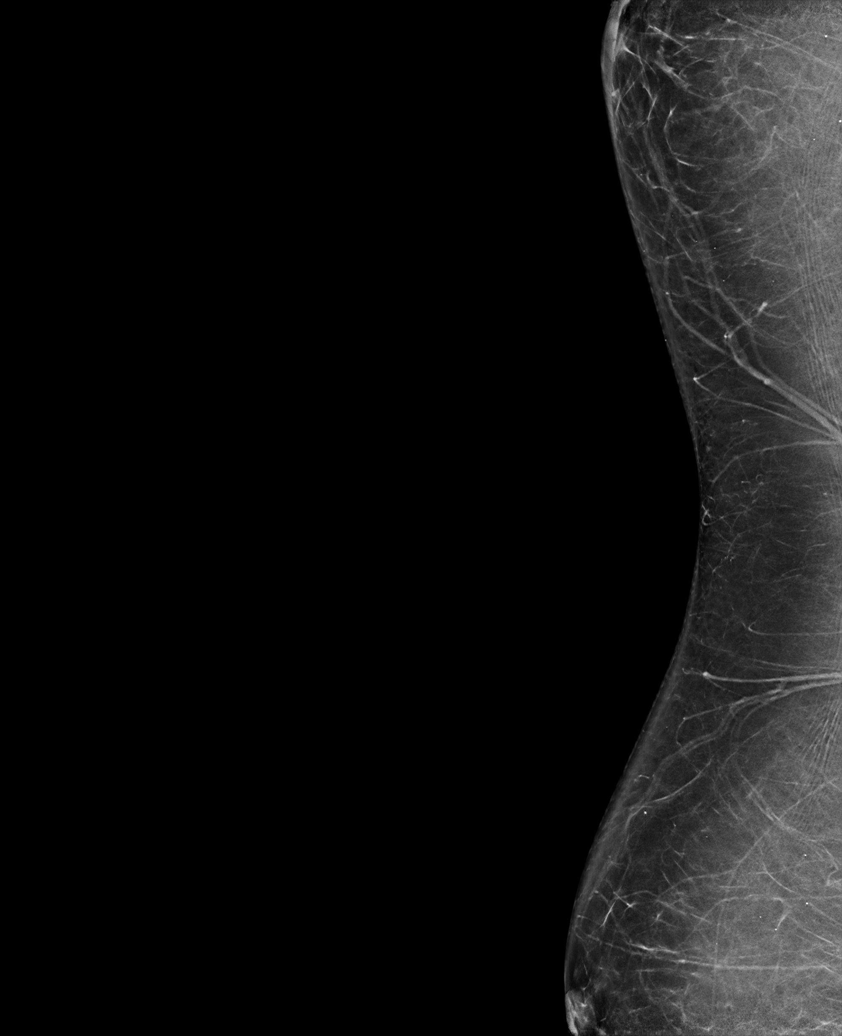

[R CV tomo · tomo slice 37/72.0]
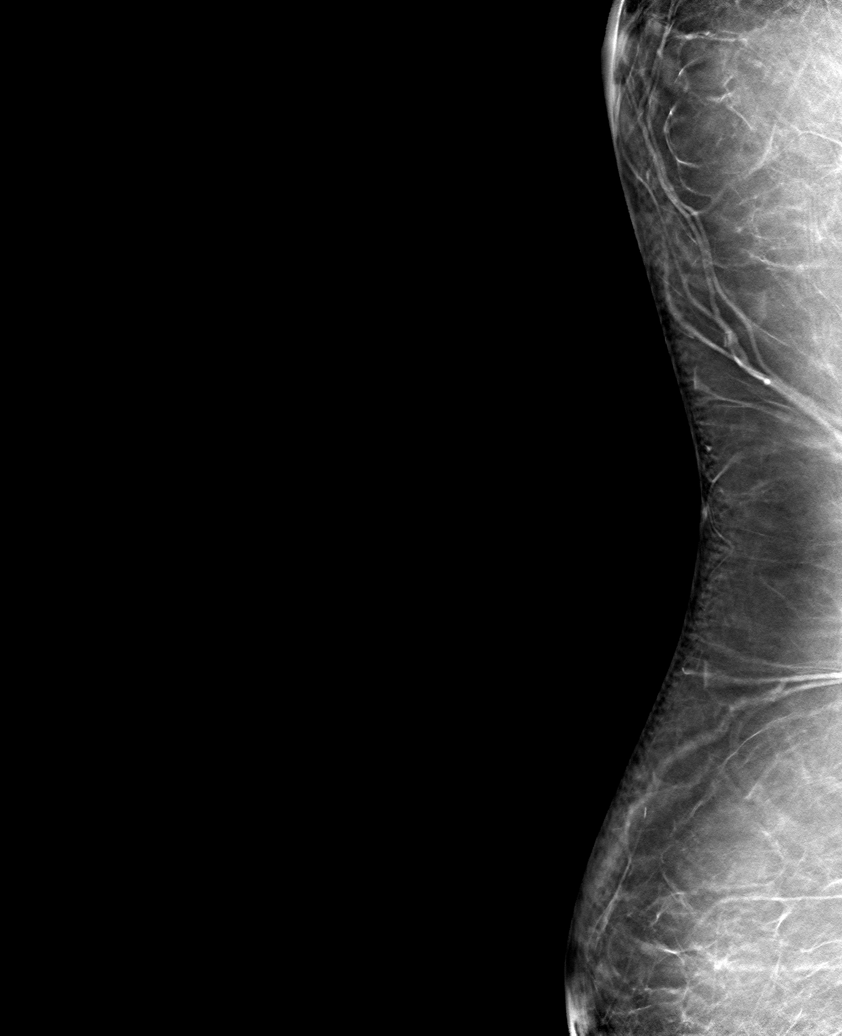

[6 of 30 positions shown; findings below may reference images not displayed]

FINDINGS: There are no findings suspicious for malignancy.
IMPRESSION: No mammographic evidence of malignancy. A result letter of this
screening mammogram will be mailed directly to the patient.

RECOMMENDATION:
Screening mammogram in one year. (Code:0E-3-N98)

BI-RADS CATEGORY  1: Negative.

## 2022-04-16 ENCOUNTER — Other Ambulatory Visit: Payer: Self-pay | Admitting: Family

## 2022-04-16 DIAGNOSIS — Z1231 Encounter for screening mammogram for malignant neoplasm of breast: Secondary | ICD-10-CM

## 2022-04-22 ENCOUNTER — Ambulatory Visit
Admission: RE | Admit: 2022-04-22 | Discharge: 2022-04-22 | Disposition: A | Discharge status: Home / Self Care | Payer: Medicare HMO | Source: Ambulatory Visit | Attending: Family | Admitting: Family

## 2022-04-22 DIAGNOSIS — Z1231 Encounter for screening mammogram for malignant neoplasm of breast: Secondary | ICD-10-CM

## 2022-05-31 ENCOUNTER — Other Ambulatory Visit: Payer: Self-pay | Admitting: Gastroenterology

## 2022-06-04 ENCOUNTER — Encounter: Payer: Self-pay | Admitting: Gastroenterology

## 2022-06-04 NOTE — Telephone Encounter (Signed)
Needs ov

## 2022-07-08 ENCOUNTER — Ambulatory Visit: Payer: Medicare HMO | Admitting: Gastroenterology

## 2022-07-09 ENCOUNTER — Ambulatory Visit (INDEPENDENT_AMBULATORY_CARE_PROVIDER_SITE_OTHER): Payer: Medicare HMO | Admitting: Gastroenterology

## 2022-07-09 ENCOUNTER — Encounter: Payer: Self-pay | Admitting: Gastroenterology

## 2022-07-09 VITALS — BP 131/74 | HR 60 | Temp 97.6°F | Ht <= 58 in | Wt 203.0 lb

## 2022-07-09 DIAGNOSIS — K219 Gastro-esophageal reflux disease without esophagitis: Secondary | ICD-10-CM

## 2022-07-09 NOTE — Progress Notes (Signed)
GI Office Note    Referring Provider: Sharion Balloon, FNP Primary Care Physician:  Sharion Balloon, FNP  Primary Gastroenterologist: Garfield Cornea, MD   Chief Complaint   Chief Complaint  Patient presents with   Follow-up    History of Present Illness   [name] is a 72 y.o. female presenting today for follow up. Last seen in 04/2020. She has history of GERD, colon polyps. Next colonoscopy due in 12/2022.   Doing well on pantoprazole but currently out and taking omeprazole. No dysphagia. No abdominal pain. BM regular. No melena, brbpr.   She had some ruq pain about 2 weeks ago lasting for about a day. Wasn't sure if it was gas. Has not had any since. Gallbladder remains in situ.   Medications   Current Outpatient Medications  Medication Sig Dispense Refill   aspirin EC 81 MG tablet Take 81 mg by mouth daily.     atorvastatin (LIPITOR) 10 MG tablet Take 1 tablet (10 mg total) by mouth daily. 90 tablet 3   blood glucose meter kit and supplies KIT Dispense based on patient and insurance preference. Use up to four times daily as directed. (FOR ICD-9 250.00, 250.01). 1 each 11   buPROPion (WELLBUTRIN XL) 300 MG 24 hr tablet Take 1 tablet (300 mg total) by mouth daily. 90 tablet 1   escitalopram (LEXAPRO) 20 MG tablet Take 1 tablet (20 mg total) by mouth daily. 90 tablet 3   glucose blood test strip Use as instructed 100 each 3   levothyroxine (SYNTHROID) 75 MCG tablet Take 1 tablet (75 mcg total) by mouth daily before breakfast. 90 tablet 1   lisinopril (ZESTRIL) 10 MG tablet Take 1 tablet (10 mg total) by mouth daily. 90 tablet 3   metFORMIN (GLUCOPHAGE) 1000 MG tablet Take 1 tablet (1,000 mg total) by mouth 2 (two) times daily with a meal. 180 tablet 0   omeprazole (PRILOSEC) 20 MG capsule Take 20 mg by mouth daily.     ONETOUCH DELICA LANCETS 99991111 MISC Use to check blood sugars up to 4 times daily 400 each 3   No current facility-administered medications for this  visit.    Allergies   Allergies as of 07/09/2022 - Review Complete 07/09/2022  Allergen Reaction Noted   Penicillins Other (See Comments) 12/08/2012   Shrimp [shellfish allergy] Itching 12/08/2012   Sulfa antibiotics Other (See Comments) 12/08/2012         Review of Systems   General: Negative for anorexia, weight loss, fever, chills, fatigue, weakness. ENT: Negative for hoarseness, difficulty swallowing , nasal congestion. CV: Negative for chest pain, angina, palpitations, dyspnea on exertion, peripheral edema.  Respiratory: Negative for dyspnea at rest, dyspnea on exertion, cough, sputum, wheezing.  GI: See history of present illness. GU:  Negative for dysuria, hematuria, urinary incontinence, urinary frequency, nocturnal urination.  Endo: Negative for unusual weight change.     Physical Exam   BP 131/74 (BP Location: Right Arm, Patient Position: Sitting, Cuff Size: Normal)   Pulse 60   Temp 97.6 F (36.4 C) (Temporal)   Ht 4' 10"$  (1.473 m)   Wt 203 lb (92.1 kg)   SpO2 96%   BMI 42.43 kg/m    General: Well-nourished, well-developed in no acute distress.  Eyes: No icterus. Mouth: Oropharyngeal mucosa moist and pink    Abdomen: Bowel sounds are normal, nontender, nondistended, no hepatosplenomegaly or masses,  no abdominal bruits or hernia , no rebound or guarding.  Rectal: not performed Extremities: No lower extremity edema. No clubbing or deformities. Neuro: Alert and oriented x 4   Skin: Warm and dry, no jaundice.   Psych: Alert and cooperative, normal mood and affect.  Labs   Lab Results  Component Value Date   TSH 1.370 02/12/2022   Lab Results  Component Value Date   HGBA1C 6.7 (H) 02/12/2022   Lab Results  Component Value Date   CREATININE 1.01 (H) 02/12/2022   BUN 17 02/12/2022   NA 140 02/12/2022   K 5.1 02/12/2022   CL 102 02/12/2022   CO2 24 02/12/2022   Lab Results  Component Value Date   ALT 12 02/12/2022   AST 13 02/12/2022   ALKPHOS  68 02/12/2022   BILITOT 0.3 02/12/2022   Lab Results  Component Value Date   WBC 9.8 02/12/2022   HGB 11.4 02/12/2022   HCT 34.9 02/12/2022   MCV 89 02/12/2022   PLT 269 02/12/2022    Imaging Studies   No results found.  Assessment   GERD: well controlled on pantoprazole. No alarm symptoms. No prior upper endoscopy. Previously declined. She has had gerd for more than five years.   H/o colon polyps: due for surveillance colonoscopy in 12/2022.   PLAN   Continue pantoprazole 48m daily.  Monitor for recurrent RUQ pain. If recurrent, consider RUQ U/S. Return in 12/2022 for colonoscopy. Consider upper endoscopy for Barrett's screening at time of colonoscopy if patient agreeable.   LLaureen Ochs [facility]

## 2022-07-09 NOTE — Patient Instructions (Signed)
Please resume pantoprazole 29m daily before breakfast. [facility]  We will have you return in 12/2022 and arrange colonoscopy at that time.  If you have recurrent upper abdominal pain, please call my CMA, Tammy, at [phone] let me know. At that time I would arrange for RUQ U/S.

## 2022-08-20 ENCOUNTER — Other Ambulatory Visit: Payer: Self-pay | Admitting: Family

## 2022-08-27 ENCOUNTER — Telehealth: Payer: Self-pay | Admitting: *Deleted

## 2022-08-27 MED ORDER — GLUCOSE BLOOD VI STRP: ORAL_STRIP | 3 refills | Status: AC

## 2022-08-27 MED ORDER — BLOOD GLUCOSE METER KIT: PACK | 0 refills | Status: AC

## 2022-08-27 MED ORDER — LANCETS MISC: 3 refills | Status: AC

## 2022-08-27 NOTE — Telephone Encounter (Signed)
TC from [place] Glucose test strips need specific directions & ICD-10 Caregiver does not know which meter she has will send new Rx for meter, strips & lancets

## 2022-08-28 ENCOUNTER — Other Ambulatory Visit: Payer: Self-pay | Admitting: Family

## 2022-10-13 ENCOUNTER — Other Ambulatory Visit: Payer: Self-pay | Admitting: Family

## 2022-10-13 DIAGNOSIS — E1169 Type 2 diabetes mellitus with other specified complication: Secondary | ICD-10-CM

## 2022-10-15 ENCOUNTER — Ambulatory Visit (INDEPENDENT_AMBULATORY_CARE_PROVIDER_SITE_OTHER): Payer: Medicare HMO

## 2022-10-15 VITALS — Ht 59.0 in | Wt 200.0 lb

## 2022-10-15 DIAGNOSIS — Z0000 Encounter for general adult medical examination without abnormal findings: Secondary | ICD-10-CM

## 2022-10-15 NOTE — Patient Instructions (Signed)
[name] , Thank you for taking time to come for your Medicare Wellness Visit. I appreciate your ongoing commitment to your health goals. Please review the following plan we discussed and let me know if I can assist you in the future.   These are the goals we discussed:  Goals      DIET - INCREASE WATER INTAKE     Patient Stated     Exercise more, be more active, lose weight, feel better        This is a list of the screening recommended for you and due dates:  Health Maintenance  Topic Date Due   COVID-19 Vaccine (3 - 2023-24 season) 01/25/2022   Eye exam for diabetics  04/03/2022   Hemoglobin A1C  08/13/2022   Complete foot exam   08/21/2022   Flu Shot  12/26/2022   Yearly kidney function blood test for diabetes  02/13/2023   Yearly kidney health urinalysis for diabetes  02/13/2023   Mammogram  04/23/2023   Medicare Annual Wellness Visit  10/15/2023   DTaP/Tdap/Td vaccine (2 - Td or Tdap) 12/12/2028   Colon Cancer Screening  01/24/2030   Pneumonia Vaccine  Completed   DEXA scan (bone density measurement)  Completed   Hepatitis C Screening: USPSTF Recommendation to screen - Ages 35-79 yo.  Completed   Zoster (Shingles) Vaccine  Completed   HPV Vaccine  Aged Out    Advanced directives: Advance directive discussed with you today. I have provided a copy for you to complete at home and have notarized. Once this is complete please bring a copy in to our office so we can scan it into your chart.   Conditions/risks identified: Aim for 30 minutes of exercise or brisk walking, 6-8 glasses of water, and 5 servings of fruits and vegetables each day.   Next appointment: Follow up in one year for your annual wellness visit    Preventive Care 65 Years and Older, Female Preventive care refers to lifestyle choices and visits with your health care provider that can promote health and wellness. What does preventive care include? A yearly physical exam. This is also called an annual well  check. Dental exams once or twice a year. Routine eye exams. Ask your health care provider how often you should have your eyes checked. Personal lifestyle choices, including: Daily care of your teeth and gums. Regular physical activity. Eating a healthy diet. Avoiding tobacco and drug use. Limiting alcohol use. Practicing safe sex. Taking low-dose aspirin every day. Taking vitamin and mineral supplements as recommended by your health care provider. What happens during an annual well check? The services and screenings done by your health care provider during your annual well check will depend on your age, overall health, lifestyle risk factors, and family history of disease. Counseling  Your health care provider may ask you questions about your: Alcohol use. Tobacco use. Drug use. Emotional well-being. Home and relationship well-being. Sexual activity. Eating habits. History of falls. Memory and ability to understand (cognition). Work and work Astronomer. Reproductive health. Screening  You may have the following tests or measurements: Height, weight, and BMI. Blood pressure. Lipid and cholesterol levels. These may be checked every 5 years, or more frequently if you are over 70 years old. Skin check. Lung cancer screening. You may have this screening every year starting at age 70 if you have a 30-pack-year history of smoking and currently smoke or have quit within the past 15 years. Fecal occult blood test (FOBT) of  the stool. You may have this test every year starting at age 70. Flexible sigmoidoscopy or colonoscopy. You may have a sigmoidoscopy every 5 years or a colonoscopy every 10 years starting at age 70. Hepatitis C blood test. Hepatitis B blood test. Sexually transmitted disease (STD) testing. Diabetes screening. This is done by checking your blood sugar (glucose) after you have not eaten for a while (fasting). You may have this done every 1-3 years. Bone density scan.  This is done to screen for osteoporosis. You may have this done starting at age 70. Mammogram. This may be done every 1-2 years. Talk to your health care provider about how often you should have regular mammograms. Talk with your health care provider about your test results, treatment options, and if necessary, the need for more tests. Vaccines  Your health care provider may recommend certain vaccines, such as: Influenza vaccine. This is recommended every year. Tetanus, diphtheria, and acellular pertussis (Tdap, Td) vaccine. You may need a Td booster every 10 years. Zoster vaccine. You may need this after age 70. Pneumococcal 13-valent conjugate (PCV13) vaccine. One dose is recommended after age 70. Pneumococcal polysaccharide (PPSV23) vaccine. One dose is recommended after age 70. Talk to your health care provider about which screenings and vaccines you need and how often you need them. This information is not intended to replace advice given to you by your health care provider. Make sure you discuss any questions you have with your health care provider. Document Released: 06/09/2015 Document Revised: 01/31/2016 Document Reviewed: 03/14/2015 Elsevier Interactive Patient Education  2017 [employer].  Fall Prevention in the Home Falls can cause injuries. They can happen to people of all ages. There are many things you can do to make your home safe and to help prevent falls. What can I do on the outside of my home? Regularly fix the edges of walkways and driveways and fix any cracks. Remove anything that might make you trip as you walk through a door, such as a raised step or threshold. Trim any bushes or trees on the path to your home. Use bright outdoor lighting. Clear any walking paths of anything that might make someone trip, such as rocks or tools. Regularly check to see if handrails are loose or broken. Make sure that both sides of any steps have handrails. Any raised decks and porches  should have guardrails on the edges. Have any leaves, snow, or ice cleared regularly. Use sand or salt on walking paths during winter. Clean up any spills in your garage right away. This includes oil or grease spills. What can I do in the bathroom? Use night lights. Install grab bars by the toilet and in the tub and shower. Do not use towel bars as grab bars. Use non-skid mats or decals in the tub or shower. If you need to sit down in the shower, use a plastic, non-slip stool. Keep the floor dry. Clean up any water that spills on the floor as soon as it happens. Remove soap buildup in the tub or shower regularly. Attach bath mats securely with double-sided non-slip rug tape. Do not have throw rugs and other things on the floor that can make you trip. What can I do in the bedroom? Use night lights. Make sure that you have a light by your bed that is easy to reach. Do not use any sheets or blankets that are too big for your bed. They should not hang down onto the floor. Have a firm chair  that has side arms. You can use this for support while you get dressed. Do not have throw rugs and other things on the floor that can make you trip. What can I do in the kitchen? Clean up any spills right away. Avoid walking on wet floors. Keep items that you use a lot in easy-to-reach places. If you need to reach something above you, use a strong step stool that has a grab bar. Keep electrical cords out of the way. Do not use floor polish or wax that makes floors slippery. If you must use wax, use non-skid floor wax. Do not have throw rugs and other things on the floor that can make you trip. What can I do with my stairs? Do not leave any items on the stairs. Make sure that there are handrails on both sides of the stairs and use them. Fix handrails that are broken or loose. Make sure that handrails are as long as the stairways. Check any carpeting to make sure that it is firmly attached to the stairs.  Fix any carpet that is loose or worn. Avoid having throw rugs at the top or bottom of the stairs. If you do have throw rugs, attach them to the floor with carpet tape. Make sure that you have a light switch at the top of the stairs and the bottom of the stairs. If you do not have them, ask someone to add them for you. What else can I do to help prevent falls? Wear shoes that: Do not have high heels. Have rubber bottoms. Are comfortable and fit you well. Are closed at the toe. Do not wear sandals. If you use a stepladder: Make sure that it is fully opened. Do not climb a closed stepladder. Make sure that both sides of the stepladder are locked into place. Ask someone to hold it for you, if possible. Clearly mark and make sure that you can see: Any grab bars or handrails. First and last steps. Where the edge of each step is. Use tools that help you move around (mobility aids) if they are needed. These include: Canes. Walkers. Scooters. Crutches. Turn on the lights when you go into a dark area. Replace any light bulbs as soon as they burn out. Set up your furniture so you have a clear path. Avoid moving your furniture around. If any of your floors are uneven, fix them. If there are any pets around you, be aware of where they are. Review your medicines with your doctor. Some medicines can make you feel dizzy. This can increase your chance of falling. Ask your doctor what other things that you can do to help prevent falls. This information is not intended to replace advice given to you by your health care provider. Make sure you discuss any questions you have with your health care provider. Document Released: 03/09/2009 Document Revised: 10/19/2015 Document Reviewed: 06/17/2014 Elsevier Interactive Patient Education  2017 [employer].

## 2022-10-15 NOTE — Progress Notes (Signed)
Subjective:   [name] is a 72 y.o. female who presents for Medicare Annual (Subsequent) preventive examination. I connected with  [name] on 10/15/22 by a audio enabled telemedicine application and verified that I am speaking with the correct person using two identifiers.  Patient Location: Home  Provider Location: Home Office  I discussed the limitations of evaluation and management by telemedicine. The patient expressed understanding and agreed to proceed.  Review of Systems     Cardiac Risk Factors include: advanced age (>61men, >70 women);diabetes mellitus;hypertension;dyslipidemia     Objective:    Today's Vitals   10/15/22 1357  Weight: 200 lb (90.7 kg)  Height: 4\' 11"  (1.499 m)   Body mass index is 40.4 kg/m.     10/15/2022    2:00 PM 10/12/2021   12:46 PM 10/11/2020    2:17 PM 01/25/2020   11:21 AM 01/24/2020    9:14 AM 05/10/2019    3:23 PM 12/16/2018    4:41 PM  Advanced Directives  Does Patient Have a Medical Advance Directive? No No No No No No No  Would patient like information on creating a medical advance directive? No - Patient declined No - Patient declined Yes (MAU/Ambulatory/Procedural Areas - Information given) No - Patient declined No - Patient declined      Current Medications (verified) Outpatient Encounter Medications as of 10/15/2022  Medication Sig   aspirin EC 81 MG tablet Take 81 mg by mouth daily.   atorvastatin (LIPITOR) 10 MG tablet Take 1 tablet (10 mg total) by mouth daily.   blood glucose meter kit and supplies KIT Dispense based on patient and insurance preference. Use up to four times daily as directed. (FOR ICD-9 250.00, 250.01).   blood glucose meter kit and supplies Dispense based on patient and insurance preference. Check BS daily Dx E11.9   buPROPion (WELLBUTRIN XL) 300 MG 24 hr tablet Take 1 tablet (300 mg total) by mouth daily.   escitalopram (LEXAPRO) 20 MG tablet Take 1 tablet (20 mg total) by mouth daily.    glucose blood test strip Check BS daily Dx E11.9   Lancets MISC Check BS daily Dx E11.9   levothyroxine (SYNTHROID) 75 MCG tablet TAKE 1 TABLET BY MOUTH ONCE DAILY BEFORE BREAKFAST   lisinopril (ZESTRIL) 10 MG tablet Take 1 tablet (10 mg total) by mouth daily.   metFORMIN (GLUCOPHAGE) 1000 MG tablet Take 1 tablet (1,000 mg total) by mouth 2 (two) times daily with a meal. (NEEDS TO BE SEEN BEFORE NEXT REFILL)   omeprazole (PRILOSEC) 20 MG capsule Take 20 mg by mouth daily.   No facility-administered encounter medications on file as of 10/15/2022.    Allergies (verified) Penicillins, Shrimp [shellfish allergy], and Sulfa antibiotics   History: Past Medical History:  Diagnosis Date   Arthritis    Depression    ONGOING PROBLEM   Diabetes mellitus without complication (HCC)    DX 2008   USES PILLS   Dizziness and giddiness 06/29/2015   GERD (gastroesophageal reflux disease)    Hypertension    Hypothyroidism    Morbidly obese (HCC)    Morton's neuroma of left foot    Past Surgical History:  Procedure Laterality Date   CARPAL TUNNEL RELEASE     LEFT   CARPAL TUNNEL RELEASE Right 12/22/2012   Procedure: CARPAL TUNNEL RELEASE- RIGHT;  Surgeon: Cammy Copa, MD;  Location: [facility] OR;  Service: Orthopedics;  Laterality: Right;   COLONOSCOPY WITH PROPOFOL N/A 01/25/2020  Rourk: 6 polyps removed, tubular adenomas.  Next colonoscopy in 3 years.   POLYPECTOMY  01/25/2020   Procedure: POLYPECTOMY;  Surgeon: Corbin Ade, MD;  Location: [facility];  Service: Endoscopy;;   SHOULDER ARTHROSCOPY W/ ROTATOR CUFF REPAIR     LEFT   SHOULDER ARTHROSCOPY WITH ROTATOR CUFF REPAIR Right 12/22/2012   Procedure: SHOULDER ARTHROSCOPY WITH ROTATOR CUFF REPAIR AND BICEPS TENODESIS REPAIR;  Surgeon: Cammy Copa, MD;  Location: [facility] OR;  Service: Orthopedics;  Laterality: Right;  Right Carpal Tunnel Release, Right Shoulder Arthroscopy, Rotator Cuff Tear Repair, Biceps Tenodesis   TOE SURGERY     LEFT  BIG TOE   TUBAL LIGATION     Family History  Problem Relation Age of Onset   Heart attack Mother    Cancer Father        brain   Heart failure Father    Dementia Sister        alzheimers   Heart disease Sister    Diabetes Sister    Hypertension Sister    Hyperlipidemia Sister    Heart failure Sister    Colon cancer Cousin        58   Cancer Brother        Prostate   Leukemia Brother    Breast cancer Neg Hx    Social History   Socioeconomic History   Marital status: Widowed    Spouse name: Not on file   Number of children: 1   Years of education: 47   Highest education level: Not on file  Occupational History   Occupation: disability  Tobacco Use   Smoking status: Never   Smokeless tobacco: Never  Vaping Use   Vaping Use: Never used  Substance and Sexual Activity   Alcohol use: No   Drug use: No   Sexual activity: Not on file  Other Topics Concern   Not on file  Social History Narrative   Patient drinks 1 soda daily.   Patient is left handed.    Daughter lives with her   Social Determinants of Health   Financial Resource Strain: Low Risk  (10/15/2022)   Overall Financial Resource Strain (CARDIA)    Difficulty of Paying Living Expenses: Not hard at all  Food Insecurity: No Food Insecurity (10/15/2022)   Hunger Vital Sign    Worried About Running Out of Food in the Last Year: Never true    Ran Out of Food in the Last Year: Never true  Transportation Needs: No Transportation Needs (10/15/2022)   PRAPARE - Administrator, Civil Service (Medical): No    Lack of Transportation (Non-Medical): No  Physical Activity: Insufficiently Active (10/15/2022)   Exercise Vital Sign    Days of Exercise per Week: 3 days    Minutes of Exercise per Session: 30 min  Stress: No Stress Concern Present (10/15/2022)   [employer] of Occupational Health - Occupational Stress Questionnaire    Feeling of Stress : Not at all  Social Connections: Socially Isolated  (10/15/2022)   Social Connection and Isolation Panel [NHANES]    Frequency of Communication with Friends and Family: More than three times a week    Frequency of Social Gatherings with Friends and Family: More than three times a week    Attends Religious Services: Never    Database administrator or Organizations: No    Attends Banker Meetings: Never    Marital Status: Widowed    Tobacco Counseling  Counseling given: Not Answered   Clinical Intake:  Pre-visit preparation completed: Yes  Pain : No/denies pain     Nutritional Risks: None Diabetes: Yes CBG done?: No Did pt. bring in CBG monitor from home?: No  How often do you need to have someone help you when you read instructions, pamphlets, or other written materials from your doctor or pharmacy?: 1 - Never  Diabetic?yes  Nutrition Risk Assessment:  Has the patient had any N/V/D within the last 2 months?  No  Does the patient have any non-healing wounds?  No  Has the patient had any unintentional weight loss or weight gain?  No   Diabetes:  Is the patient diabetic?  Yes  If diabetic, was a CBG obtained today?  No  Did the patient bring in their glucometer from home?  No  How often do you monitor your CBG's? 2 x day .   Financial Strains and Diabetes Management:  Are you having any financial strains with the device, your supplies or your medication? No .  Does the patient want to be seen by Chronic Care Management for management of their diabetes?  No  Would the patient like to be referred to a Nutritionist or for Diabetic Management?  No   Diabetic Exams:  Diabetic Eye Exam: Completed 03/2022 Diabetic Foot Exam: Overdue, Pt has been advised about the importance in completing this exam. Pt is scheduled for diabetic foot exam on next office visit .   Interpreter Needed?: No  Information entered by :: Renie Ora, LPN   Activities of Daily Living    10/15/2022    2:00 PM  In your present state  of health, do you have any difficulty performing the following activities:  Hearing? 0  Vision? 0  Difficulty concentrating or making decisions? 0  Walking or climbing stairs? 0  Dressing or bathing? 0  Doing errands, shopping? 0  Preparing Food and eating ? N  Using the Toilet? N  In the past six months, have you accidently leaked urine? N  Do you have problems with loss of bowel control? N  Managing your Medications? N  Managing your Finances? N  Housekeeping or managing your Housekeeping? N    Patient Care Team: Junie Spencer, FNP as PCP - General (Family Medicine) Danella Maiers, [facility] (Pharmacist) Jena Gauss Gerrit Friends, MD as Consulting Physician (Gastroenterology)  Indicate any recent Medical Services you may have received from other than [facility] providers in the past year (date may be approximate).     Assessment:   This is a routine wellness examination for Lanyah.  Hearing/Vision screen Vision Screening - Comments:: Wears rx glasses - up to date with routine eye exams with  Dr.Johnson   Dietary issues and exercise activities discussed: Current Exercise Habits: Home exercise routine, Type of exercise: walking, Time (Minutes): 30, Frequency (Times/Week): 3, Weekly Exercise (Minutes/Week): 90, Intensity: Mild, Exercise limited by: None identified   Goals Addressed             This Visit's Progress    DIET - INCREASE WATER INTAKE         Depression Screen    10/15/2022    1:59 PM 02/12/2022    9:51 AM 10/12/2021   12:46 PM 08/20/2021   10:52 AM 06/29/2021   10:40 AM 04/24/2021   10:38 AM 02/19/2021   11:07 AM  PHQ 2/9 Scores  PHQ - 2 Score 0 2 1 1  0 0 2  PHQ- 9 Score  6  6 10 6  8     Fall Risk    10/15/2022    1:58 PM 10/12/2021   12:43 PM 08/20/2021   10:49 AM 04/24/2021   10:38 AM 11/16/2020   10:59 AM  Fall Risk   Falls in the past year? 1 0 0 0 0  Number falls in past yr: 1 0     Injury with Fall? 1 0     Risk for fall due to : History of fall(s);Impaired  balance/gait;Orthopedic patient Orthopedic patient     Follow up Education provided;Falls prevention discussed Falls prevention discussed       FALL RISK PREVENTION PERTAINING TO THE HOME:  Any stairs in or around the home? No  If so, are there any without handrails? No  Home free of loose throw rugs in walkways, pet beds, electrical cords, etc? Yes  Adequate lighting in your home to reduce risk of falls? Yes   ASSISTIVE DEVICES UTILIZED TO PREVENT FALLS:  Life alert? No  Use of a cane, walker or w/c? No  Grab bars in the bathroom? No  Shower chair or bench in shower? No  Elevated toilet seat or a handicapped toilet? No          10/15/2022    2:01 PM 10/12/2021   12:48 PM 10/11/2020    2:15 PM  6CIT Screen  What Year? 0 points 0 points 0 points  What month? 0 points 3 points 0 points  What time? 0 points 0 points 0 points  Count back from 20 0 points 0 points 0 points  Months in reverse 0 points 4 points 0 points  Repeat phrase 0 points 2 points 0 points  Total Score 0 points 9 points 0 points    Immunizations Immunization History  Administered Date(s) Administered   Fluad Quad(high Dose 65+) 02/16/2020, 02/19/2021, 02/12/2022   Influenza Split 03/27/2017   Influenza, High Dose Seasonal PF 06/01/2018, 02/25/2019   Influenza,inj,quad, With Preservative 03/21/2017   Moderna Sars-Covid-2 Vaccination 08/31/2019, 09/28/2019   Pneumococcal Conjugate-13 08/07/2017   Pneumococcal Polysaccharide-23 05/07/2019   Tdap 12/13/2018   Zoster Recombinat (Shingrix) 06/29/2021, 02/12/2022    TDAP status: Up to date  Flu Vaccine status: Up to date  Pneumococcal vaccine status: Up to date  Covid-19 vaccine status: Completed vaccines  Qualifies for Shingles Vaccine? Yes   Zostavax completed Yes   Shingrix Completed?: Yes  Screening Tests Health Maintenance  Topic Date Due   COVID-19 Vaccine (3 - 2023-24 season) 01/25/2022   OPHTHALMOLOGY EXAM  04/03/2022   HEMOGLOBIN A1C   08/13/2022   FOOT EXAM  08/21/2022   INFLUENZA VACCINE  12/26/2022   Diabetic kidney evaluation - eGFR measurement  02/13/2023   Diabetic kidney evaluation - Urine ACR  02/13/2023   MAMMOGRAM  04/23/2023   Medicare Annual Wellness (AWV)  10/15/2023   DTaP/Tdap/Td (2 - Td or Tdap) 12/12/2028   COLONOSCOPY (Pts 45-46yrs Insurance coverage will need to be confirmed)  01/24/2030   Pneumonia Vaccine 76+ Years old  Completed   DEXA SCAN  Completed   Hepatitis C Screening  Completed   Zoster Vaccines- Shingrix  Completed   HPV VACCINES  Aged Out    Health Maintenance  Health Maintenance Due  Topic Date Due   COVID-19 Vaccine (3 - 2023-24 season) 01/25/2022   OPHTHALMOLOGY EXAM  04/03/2022   HEMOGLOBIN A1C  08/13/2022   FOOT EXAM  08/21/2022    Colorectal cancer screening: Type of screening: Colonoscopy. Completed 01/25/2020. Repeat every  10 years  Mammogram status: Completed 04/22/2022. Repeat every year  Bone Density status: Completed 03/28/20. Results reflect: Bone density results: OSTEOPENIA. Repeat every 5 years.  Lung Cancer Screening: (Low Dose CT Chest recommended if Age 27-80 years, 30 pack-year currently smoking OR have quit w/in 15years.) does not qualify.   Lung Cancer Screening Referral: n/a  Additional Screening:  Hepatitis C Screening: does not qualify; Completed 11/01/2019  Vision Screening: Recommended annual ophthalmology exams for early detection of glaucoma and other disorders of the eye. Is the patient up to date with their annual eye exam?  Yes  Who is the provider or what is the name of the office in which the patient attends annual eye exams? Dr.Johnson  If pt is not established with a provider, would they like to be referred to a provider to establish care? No .   Dental Screening: Recommended annual dental exams for proper oral hygiene  Community Resource Referral / Chronic Care Management: CRR required this visit?  No   CCM required this visit?  No       Plan:     I have personally reviewed and noted the following in the patient's chart:   Medical and social history Use of alcohol, tobacco or illicit drugs  Current medications and supplements including opioid prescriptions. Patient is not currently taking opioid prescriptions. Functional ability and status Nutritional status Physical activity Advanced directives List of other physicians Hospitalizations, surgeries, and ER visits in previous 12 months Vitals Screenings to include cognitive, depression, and falls Referrals and appointments  In addition, I have reviewed and discussed with patient certain preventive protocols, quality metrics, and best practice recommendations. A written personalized care plan for preventive services as well as general preventive health recommendations were provided to patient.     Lorrene Reid, LPN   [id number]/[id number]   Nurse Notes: none

## 2022-10-22 ENCOUNTER — Other Ambulatory Visit: Payer: Self-pay | Admitting: Family

## 2022-10-22 ENCOUNTER — Other Ambulatory Visit: Payer: Self-pay | Admitting: Gastroenterology

## 2022-10-22 DIAGNOSIS — F3341 Major depressive disorder, recurrent, in partial remission: Secondary | ICD-10-CM

## 2022-10-30 ENCOUNTER — Telehealth (INDEPENDENT_AMBULATORY_CARE_PROVIDER_SITE_OTHER): Payer: Medicare HMO | Admitting: Family Medicine

## 2022-10-30 DIAGNOSIS — J329 Chronic sinusitis, unspecified: Secondary | ICD-10-CM | POA: Diagnosis not present

## 2022-10-30 DIAGNOSIS — J40 Bronchitis, not specified as acute or chronic: Secondary | ICD-10-CM

## 2022-10-30 MED ORDER — LEVOFLOXACIN 500 MG PO TABS: 500.0000 mg | ORAL_TABLET | Freq: Every day | ORAL | 0 refills | Status: DC

## 2022-10-30 NOTE — Progress Notes (Signed)
Subjective:    Patient ID: [name], female    DOB: 1950-09-08, 72 y.o.   MRN: [id number]   HPI: [name] is a 72 y.o. female presenting for chest congestion. Worse at night. OTCs not helping. Coughing, dyspneic. Some brown sputum. Onset 1 week ago. Cough makes her ribs hurt. Has LGF. No UR sx.        10/15/2022    1:59 PM 02/12/2022    9:51 AM 10/12/2021   12:46 PM 08/20/2021   10:52 AM 06/29/2021   10:40 AM  Depression screen PHQ 2/9  Decreased Interest 0 2 1 1  0  Down, Depressed, Hopeless 0 0 0  0  PHQ - 2 Score 0 2 1 1  0  Altered sleeping  0 1 2 2   Tired, decreased energy  1 1 1 1   Change in appetite  1 1 3 1   Feeling bad or failure about yourself   1 1 2 1   Trouble concentrating  1 1 1 1   Moving slowly or fidgety/restless  0 0 0 0  Suicidal thoughts  0 0 0 0  PHQ-9 Score  6 6 10 6   Difficult doing work/chores  Not difficult at all   Somewhat difficult     Relevant past medical, surgical, family and social history reviewed and updated as indicated.  Interim medical history since our last visit reviewed. Allergies and medications reviewed and updated.  ROS:  Review of Systems  Constitutional:  Positive for appetite change (decreased), fatigue and fever. Negative for chills and diaphoresis.  HENT:  Negative for congestion, ear pain, hearing loss, postnasal drip, rhinorrhea, sore throat and trouble swallowing.   Respiratory:  Positive for cough and shortness of breath. Negative for chest tightness.   Cardiovascular:  Negative for chest pain and palpitations.  Gastrointestinal:  Negative for abdominal pain.  Musculoskeletal:  Negative for arthralgias.  Skin:  Negative for rash.     Social History   Tobacco Use  Smoking Status Never  Smokeless Tobacco Never       Objective:     Wt Readings from Last 3 Encounters:  10/15/22 200 lb (90.7 kg)  07/09/22 203 lb (92.1 kg)  02/12/22 193 lb (87.5 kg)     Video visit performed.   Assessment & Plan:    1. Sinobronchitis     Meds ordered this encounter  Medications   levofloxacin (LEVAQUIN) 500 MG tablet    Sig: Take 1 tablet (500 mg total) by mouth daily.    Dispense:  7 tablet    Refill:  0        Diagnoses and all orders for this visit:  Sinobronchitis  Other orders -     levofloxacin (LEVAQUIN) 500 MG tablet; Take 1 tablet (500 mg total) by mouth daily.    Virtual Visit via telephone Note  I discussed the limitations, risks, security and privacy concerns of performing an evaluation and management service by video and the availability of in person appointments. The patient was identified with two identifiers. Pt.expressed understanding and agreed to proceed. Pt. Is at home. Dr. Darlyn Read is in his office.  Follow Up Instructions:   I discussed the assessment and treatment plan with the patient. The patient was provided an opportunity to ask questions and all were answered. The patient agreed with the plan and demonstrated an understanding of the instructions.   The patient was advised to call back or seek an in-person evaluation if the symptoms worsen or  if the condition fails to improve as anticipated.   Total minutes including chart review and phone contact time: 12   Follow up plan: No follow-ups on file.  Mechele Claude, MD [place] [facility]

## 2022-11-01 ENCOUNTER — Encounter: Payer: Self-pay | Admitting: Family

## 2022-11-01 ENCOUNTER — Ambulatory Visit (INDEPENDENT_AMBULATORY_CARE_PROVIDER_SITE_OTHER): Payer: Medicare HMO | Admitting: Family

## 2022-11-01 ENCOUNTER — Ambulatory Visit (INDEPENDENT_AMBULATORY_CARE_PROVIDER_SITE_OTHER): Payer: Medicare HMO

## 2022-11-01 VITALS — BP 132/74 | HR 64 | Temp 97.1°F | Ht 59.0 in | Wt 203.0 lb

## 2022-11-01 DIAGNOSIS — B9689 Other specified bacterial agents as the cause of diseases classified elsewhere: Secondary | ICD-10-CM

## 2022-11-01 DIAGNOSIS — R059 Cough, unspecified: Secondary | ICD-10-CM | POA: Diagnosis not present

## 2022-11-01 DIAGNOSIS — J208 Acute bronchitis due to other specified organisms: Secondary | ICD-10-CM

## 2022-11-01 DIAGNOSIS — R0602 Shortness of breath: Secondary | ICD-10-CM | POA: Diagnosis not present

## 2022-11-01 MED ORDER — ALBUTEROL SULFATE HFA 108 (90 BASE) MCG/ACT IN AERS: 2.0000 | INHALATION_SPRAY | Freq: Four times a day (QID) | RESPIRATORY_TRACT | 0 refills | Status: DC | PRN

## 2022-11-01 MED ORDER — PREDNISONE 10 MG (21) PO TBPK: ORAL_TABLET | ORAL | 0 refills | Status: DC

## 2022-11-01 NOTE — Progress Notes (Signed)
Subjective:    Patient ID: [name], female    DOB: 24-Mar-1951, 72 y.o.   MRN: [id number]  Chief Complaint  Patient presents with   Cough   Shortness of Breath   Fever   Pt presents today  for cough and SOB that started last week. She had a video visit on 10/30/22 and was given Levofloxacin 500 mg daily. She has only taken one dose of this.  Cough This is a new problem. The current episode started 1 to 4 weeks ago. The problem has been gradually improving. The problem occurs every few minutes. The cough is Productive of sputum and productive of brown sputum. Associated symptoms include a fever, headaches, shortness of breath and wheezing. Pertinent negatives include no chills, ear congestion, ear pain, myalgias or sore throat. She has tried rest and OTC cough suppressant for the symptoms. The treatment provided mild relief.  Shortness of Breath Associated symptoms include a fever, headaches and wheezing. Pertinent negatives include no ear pain or sore throat.  Fever  Associated symptoms include coughing, headaches and wheezing. Pertinent negatives include no ear pain or sore throat.      Review of Systems  Constitutional:  Positive for fever. Negative for chills.  HENT:  Negative for ear pain and sore throat.   Respiratory:  Positive for cough, shortness of breath and wheezing.   Musculoskeletal:  Negative for myalgias.  Neurological:  Positive for headaches.  All other systems reviewed and are negative.      Objective:   Physical Exam Vitals reviewed.  Constitutional:      General: She is not in acute distress.    Appearance: She is well-developed. She is obese.  HENT:     Head: Normocephalic and atraumatic.     Right Ear: External ear normal.  Eyes:     Pupils: Pupils are equal, round, and reactive to light.  Neck:     Thyroid: No thyromegaly.  Cardiovascular:     Rate and Rhythm: Normal rate and regular rhythm.     Heart sounds: Normal heart sounds. No murmur  heard. Pulmonary:     Effort: Pulmonary effort is normal. No respiratory distress.     Breath sounds: Wheezing and rhonchi present.  Abdominal:     General: Bowel sounds are normal. There is no distension.     Palpations: Abdomen is soft.     Tenderness: There is no abdominal tenderness.  Musculoskeletal:        General: No tenderness. Normal range of motion.     Cervical back: Normal range of motion and neck supple.  Skin:    General: Skin is warm and dry.  Neurological:     Mental Status: She is alert and oriented to person, place, and time.     Cranial Nerves: No cranial nerve deficit.     Deep Tendon Reflexes: Reflexes are normal and symmetric.  Psychiatric:        Behavior: Behavior normal.        Thought Content: Thought content normal.        Judgment: Judgment normal.       BP 132/74   Pulse 64   Temp (!) 97.1 F (36.2 C) (Temporal)   Ht 4\' 11"  (1.499 m)   Wt 203 lb (92.1 kg)   SpO2 97%   BMI 41.00 kg/m      Assessment & Plan:  [name] comes in today with chief complaint of Cough, Shortness of Breath, and Fever  Diagnosis and orders addressed:  1. Acute bacterial bronchitis - Take meds as prescribed - Use a cool mist humidifier  -Use saline nose sprays frequently -Force fluids -For any cough or congestion  Use plain Mucinex- regular strength or max strength is fine -For fever or aces or pains- take tylenol or ibuprofen. -Throat lozenges if help -Follow up if symptoms worsen or not improve  Red flags discussed to go to ED Continue Levaquin  Start prednisone  Albuterol as needed  - predniSONE (STERAPRED UNI-PAK 21 TAB) 10 MG (21) TBPK tablet; Use as directed  Dispense: 21 tablet; Refill: 0 - DG Chest 2 View - albuterol (VENTOLIN HFA) 108 (90 Base) MCG/ACT inhaler; Inhale 2 puffs into the lungs every 6 (six) hours as needed for wheezing or shortness of breath.  Dispense: 8 g; Refill: 0   Jannifer Rodney, FNP

## 2022-11-01 NOTE — Patient Instructions (Signed)

## 2022-11-27 ENCOUNTER — Other Ambulatory Visit: Payer: Self-pay | Admitting: Family

## 2022-11-27 DIAGNOSIS — E1169 Type 2 diabetes mellitus with other specified complication: Secondary | ICD-10-CM

## 2022-11-27 DIAGNOSIS — F3341 Major depressive disorder, recurrent, in partial remission: Secondary | ICD-10-CM

## 2022-11-27 NOTE — Telephone Encounter (Signed)
[facility] pt NTBS 30-d given 10/14/22 

## 2022-12-03 ENCOUNTER — Encounter: Payer: Self-pay | Admitting: Family

## 2022-12-03 ENCOUNTER — Ambulatory Visit (INDEPENDENT_AMBULATORY_CARE_PROVIDER_SITE_OTHER): Payer: Medicare HMO | Admitting: Family

## 2022-12-03 VITALS — BP 138/74 | HR 92 | Temp 97.0°F | Ht 59.0 in | Wt 206.8 lb

## 2022-12-03 DIAGNOSIS — E1169 Type 2 diabetes mellitus with other specified complication: Secondary | ICD-10-CM | POA: Diagnosis not present

## 2022-12-03 DIAGNOSIS — I10 Essential (primary) hypertension: Secondary | ICD-10-CM

## 2022-12-03 DIAGNOSIS — K219 Gastro-esophageal reflux disease without esophagitis: Secondary | ICD-10-CM | POA: Diagnosis not present

## 2022-12-03 DIAGNOSIS — M159 Polyosteoarthritis, unspecified: Secondary | ICD-10-CM | POA: Diagnosis not present

## 2022-12-03 DIAGNOSIS — E785 Hyperlipidemia, unspecified: Secondary | ICD-10-CM

## 2022-12-03 DIAGNOSIS — F3341 Major depressive disorder, recurrent, in partial remission: Secondary | ICD-10-CM | POA: Diagnosis not present

## 2022-12-03 DIAGNOSIS — Z7985 Long-term (current) use of injectable non-insulin antidiabetic drugs: Secondary | ICD-10-CM

## 2022-12-03 DIAGNOSIS — E039 Hypothyroidism, unspecified: Secondary | ICD-10-CM

## 2022-12-03 DIAGNOSIS — B370 Candidal stomatitis: Secondary | ICD-10-CM | POA: Diagnosis not present

## 2022-12-03 LAB — BAYER DCA HB A1C WAIVED: HB A1C (BAYER DCA - WAIVED): 9.1 % — ABNORMAL HIGH (ref 4.8–5.6)

## 2022-12-03 MED ORDER — NYSTATIN 100000 UNIT/ML MT SUSP: 5.0000 mL | Freq: Four times a day (QID) | OROMUCOSAL | 1 refills | Status: AC

## 2022-12-03 MED ORDER — SEMAGLUTIDE(0.25 OR 0.5MG/DOS) 2 MG/3ML ~~LOC~~ SOPN: 0.2500 mg | PEN_INJECTOR | SUBCUTANEOUS | 1 refills | Status: DC

## 2022-12-03 NOTE — Progress Notes (Signed)
Subjective:    Patient ID: [name], female    DOB: [birth year], 72 y.o.   MRN: [id number]  Chief Complaint  Patient presents with   Medical Management of Chronic Issues   Thrush   Pt presents to the office today for CPE. She is taking Ozempic for diabetes and weight loss, however, the 1 mg dose was making her sick and stopped it.      12/03/2022    2:00 PM 11/01/2022    2:54 PM 10/15/2022    1:57 PM  Last 3 Weights  Weight (lbs) 206 lb 12.8 oz 203 lb 200 lb  Weight (kg) 93.804 kg 92.08 kg 90.719 kg    She is morbid obese with a BMI of 41 and HTN and DM.  Hypertension This is a chronic problem. The current episode started more than 1 year ago. The problem has been resolved since onset. The problem is controlled. Associated symptoms include blurred vision, malaise/fatigue and peripheral edema. Pertinent negatives include no shortness of breath. Risk factors for coronary artery disease include diabetes mellitus, dyslipidemia, obesity and sedentary lifestyle. The current treatment provides moderate improvement. Identifiable causes of hypertension include a thyroid problem.  Gastroesophageal Reflux She complains of belching and heartburn. This is a chronic problem. The current episode started more than 1 year ago. The problem occurs occasionally. Associated symptoms include fatigue. Risk factors include obesity. She has tried a PPI for the symptoms. The treatment provided moderate relief.  Thyroid Problem Presents for follow-up visit. Symptoms include constipation and fatigue. Patient reports no anxiety or diarrhea. The symptoms have been stable. Her past medical history is significant for hyperlipidemia.  Hyperlipidemia This is a chronic problem. The current episode started more than 1 year ago. The problem is controlled. Recent lipid tests were reviewed and are normal. Exacerbating diseases include obesity. Pertinent negatives include no shortness of breath. Current antihyperlipidemic  treatment includes statins. The current treatment provides moderate improvement of lipids. Risk factors for coronary artery disease include diabetes mellitus, dyslipidemia, hypertension and a sedentary lifestyle.  Diabetes She presents for her follow-up diabetic visit. She has type 2 diabetes mellitus. Pertinent negatives for hypoglycemia include no nervousness/anxiousness. Associated symptoms include blurred vision and fatigue. Pertinent negatives for diabetes include no foot paresthesias. Symptoms are stable. Risk factors for coronary artery disease include diabetes mellitus, dyslipidemia, hypertension and sedentary lifestyle. She is following a generally unhealthy diet. Her overall blood glucose range is >200 mg/dl.  Arthritis Presents for follow-up visit. She complains of pain and stiffness. The symptoms have been stable. Affected locations include the left knee, right knee, left MCP and right MCP. Her pain is at a severity of 3/10. Associated symptoms include fatigue. Pertinent negatives include no diarrhea.  Depression        This is a chronic problem.  The current episode started more than 1 year ago.   The problem occurs intermittently.  Associated symptoms include fatigue and sad.  Associated symptoms include no helplessness and no hopelessness.  Past treatments include SSRIs - Selective serotonin reuptake inhibitors.  Past medical history includes thyroid problem.       Review of Systems  Constitutional:  Positive for fatigue and malaise/fatigue.  Eyes:  Positive for blurred vision.  Respiratory:  Negative for shortness of breath.   Gastrointestinal:  Positive for constipation and heartburn. Negative for diarrhea.  Musculoskeletal:  Positive for arthritis and stiffness.  Psychiatric/Behavioral:  Positive for depression. The patient is not nervous/anxious.   All other systems reviewed and  are negative.      Objective:   Physical Exam Vitals reviewed.  Constitutional:      General:  She is not in acute distress.    Appearance: She is well-developed. She is obese.  HENT:     Head: Normocephalic and atraumatic.     Right Ear: Tympanic membrane normal.     Left Ear: Tympanic membrane normal.  Eyes:     Pupils: Pupils are equal, round, and reactive to light.  Neck:     Thyroid: No thyromegaly.  Cardiovascular:     Rate and Rhythm: Normal rate and regular rhythm.     Heart sounds: Normal heart sounds. No murmur heard. Pulmonary:     Effort: Pulmonary effort is normal. No respiratory distress.     Breath sounds: Normal breath sounds. No wheezing.  Abdominal:     General: Bowel sounds are normal. There is no distension.     Palpations: Abdomen is soft.     Tenderness: There is no abdominal tenderness.  Musculoskeletal:        General: No tenderness. Normal range of motion.     Cervical back: Normal range of motion and neck supple.  Skin:    General: Skin is warm and dry.  Neurological:     Mental Status: She is alert and oriented to person, place, and time.     Cranial Nerves: No cranial nerve deficit.     Deep Tendon Reflexes: Reflexes are normal and symmetric.  Psychiatric:        Behavior: Behavior normal.        Thought Content: Thought content normal.        Judgment: Judgment normal.     Diabetic Foot Exam - Simple   Simple Foot Form Diabetic Foot exam was performed with the following findings: Yes 12/03/2022  2:40 PM  Visual Inspection No deformities, no ulcerations, no other skin breakdown bilaterally: Yes Sensation Testing Intact to touch and monofilament testing bilaterally: Yes Pulse Check Posterior Tibialis and Dorsalis pulse intact bilaterally: Yes Comments      BP 138/74   Pulse 92   Temp (!) 97 F (36.1 C) (Temporal)   Ht 4\' 11"  (1.499 m)   Wt 206 lb 12.8 oz (93.8 kg)   SpO2 95%   BMI 41.77 kg/m      Assessment & Plan:  [name] comes in today with chief complaint of Medical Management of Chronic Issues and  Thrush   Diagnosis and orders addressed:  1. Type 2 diabetes mellitus with other specified complication, without long-term current use of insulin (HCC) Will restart Ozempic 0.25 mg  Low carb diet  - Semaglutide,0.25 or 0.5MG /DOS, 2 MG/3ML SOPN; Inject 0.25 mg into the skin once a week.  Dispense: 3 mL; Refill: 1 - Bayer DCA Hb A1c Waived - CBC with Differential/Platelet - CMP14+EGFR  2. Essential hypertension - CBC with Differential/Platelet - CMP14+EGFR  3. Gastroesophageal reflux disease, unspecified whether esophagitis present - CBC with Differential/Platelet - CMP14+EGFR  4. Recurrent major depressive disorder, in partial remission (HCC) - CBC with Differential/Platelet - CMP14+EGFR  5. Primary osteoarthritis involving multiple joints - CBC with Differential/Platelet - CMP14+EGFR  6. Morbid obesity (HC - CBC with Differential/Platelet - CMP14+EGFR  7. Hypothyroidism, unspecified type - CBC with Differential/Platelet - CMP14+EGFR - TSH  8. Hyperlipidemia associated with type 2 diabetes mellitus (HCC) - CBC with Differential/Platelet - CMP14+EGFR - Lipid panel  9. Oral thrush Start nystatin  - nystatin (MYCOSTATIN) 100000 UNIT/ML suspension; Take  5 mLs (500,000 Units total) by mouth 4 (four) times daily.  Dispense: 120 mL; Refill: 1   Labs pending Start Ozempic 0.25 mg  Low carb diet  Health Maintenance reviewed Diet and exercise encouraged  Follow up plan: 1 month    Jannifer Rodney, FNP

## 2022-12-03 NOTE — Patient Instructions (Signed)

## 2022-12-04 LAB — CMP14+EGFR
ALT: 21 IU/L (ref 0–32)
AST: 20 IU/L (ref 0–40)
Albumin: 4.1 g/dL (ref 3.9–4.9)
Alkaline Phosphatase: 69 IU/L (ref 44–121)
BUN/Creatinine Ratio: 18 (ref 12–28)
BUN: 16 mg/dL (ref 8–27)
Bilirubin Total: 0.3 mg/dL (ref 0.0–1.2)
CO2: 21 mmol/L (ref 20–29)
Calcium: 9.6 mg/dL (ref 8.7–10.3)
Chloride: 103 mmol/L (ref 96–106)
Creatinine, Ser: 0.88 mg/dL (ref 0.57–1.00)
Globulin, Total: 2.1 g/dL (ref 1.5–4.5)
Glucose: 221 mg/dL — ABNORMAL HIGH (ref 70–99)
Potassium: 5.3 mmol/L — ABNORMAL HIGH (ref 3.5–5.2)
Sodium: 140 mmol/L (ref 134–144)
Total Protein: 6.2 g/dL (ref 6.0–8.5)
eGFR: 71 mL/min/{1.73_m2} (ref >59)

## 2022-12-04 LAB — LIPID PANEL
Chol/HDL Ratio: 2.8 ratio (ref 0.0–4.4)
Cholesterol, Total: 149 mg/dL (ref 100–199)
HDL: 54 mg/dL (ref >39)
LDL Chol Calc (NIH): 72 mg/dL (ref 0–99)
Triglycerides: 133 mg/dL (ref 0–149)
VLDL Cholesterol Cal: 23 mg/dL (ref 5–40)

## 2022-12-04 LAB — TSH: TSH: 2.39 u[IU]/mL (ref 0.450–4.500)

## 2022-12-04 LAB — CBC WITH DIFFERENTIAL/PLATELET
Basophils Absolute: 0.1 10*3/uL (ref 0.0–0.2)
Basos: 1 %
EOS (ABSOLUTE): 0.2 10*3/uL (ref 0.0–0.4)
Eos: 2 %
Hematocrit: 33.3 % — ABNORMAL LOW (ref 34.0–46.6)
Hemoglobin: 10.5 g/dL — ABNORMAL LOW (ref 11.1–15.9)
Immature Grans (Abs): 0.1 10*3/uL (ref 0.0–0.1)
Immature Granulocytes: 1 %
Lymphocytes Absolute: 2.7 10*3/uL (ref 0.7–3.1)
Lymphs: 36 %
MCH: 27.6 pg (ref 26.6–33.0)
MCHC: 31.5 g/dL (ref 31.5–35.7)
MCV: 87 fL (ref 79–97)
Monocytes Absolute: 0.5 10*3/uL (ref 0.1–0.9)
Monocytes: 6 %
Neutrophils Absolute: 4.1 10*3/uL (ref 1.4–7.0)
Neutrophils: 54 %
Platelets: 255 10*3/uL (ref 150–450)
RBC: 3.81 x10E6/uL (ref 3.77–5.28)
RDW: 15.7 % — ABNORMAL HIGH (ref 11.7–15.4)
WBC: 7.6 10*3/uL (ref 3.4–10.8)

## 2022-12-05 ENCOUNTER — Other Ambulatory Visit: Payer: Self-pay | Admitting: Family Medicine

## 2022-12-05 DIAGNOSIS — E1169 Type 2 diabetes mellitus with other specified complication: Secondary | ICD-10-CM

## 2022-12-08 ENCOUNTER — Other Ambulatory Visit: Payer: Self-pay | Admitting: Family

## 2022-12-08 DIAGNOSIS — E1169 Type 2 diabetes mellitus with other specified complication: Secondary | ICD-10-CM

## 2022-12-09 ENCOUNTER — Telehealth: Payer: Self-pay

## 2022-12-09 NOTE — Progress Notes (Signed)
   Care Guide Note  12/09/2022 Name: [name] MRN: [id number] DOB: [birth year]  Referred by: Junie Spencer, FNP Reason for referral : Care Coordination (Outreach to schedule Referral with Pharm d )   [name] is a 72 y.o. year old female who is a primary care patient of Junie Spencer, FNP. [name] was referred to the pharmacist for assistance related to DM.    An unsuccessful telephone outreach was attempted today to contact the patient who was referred to the pharmacy team for assistance with medication management. Additional attempts will be made to contact the patient.   Penne Lash, RMA Care Guide [facility]  [place], [place] [address] Direct Dial: [phone] [email or web address]

## 2022-12-27 NOTE — Progress Notes (Signed)
   Care Guide Note  12/27/2022 Name: [name] MRN: [id number] DOB: March 16, 1951  Referred by: Junie Spencer, FNP Reason for referral : Care Coordination (Outreach to schedule Referral with Pharm d )   [name] is a 72 y.o. year old female who is a primary care patient of Junie Spencer, FNP. [name] was referred to the pharmacist for assistance related to DM.    Successful contact was made with the patient to discuss pharmacy services including being ready for the pharmacist to call at least 5 minutes before the scheduled appointment time, to have medication bottles and any blood sugar or blood pressure readings ready for review. The patient agreed to meet with the pharmacist via with the pharmacist via telephone visit on (date/time).  01/17/2023  Penne Lash, RMA Care Guide [facility]  [place], [place] [address] Direct Dial: [phone] [email or web address]

## 2022-12-28 ENCOUNTER — Other Ambulatory Visit: Payer: Self-pay | Admitting: Family

## 2022-12-28 DIAGNOSIS — F3341 Major depressive disorder, recurrent, in partial remission: Secondary | ICD-10-CM

## 2022-12-31 ENCOUNTER — Other Ambulatory Visit: Payer: Self-pay | Admitting: Family

## 2022-12-31 DIAGNOSIS — E1169 Type 2 diabetes mellitus with other specified complication: Secondary | ICD-10-CM

## 2023-01-03 ENCOUNTER — Encounter: Payer: Self-pay | Admitting: Family

## 2023-01-03 ENCOUNTER — Ambulatory Visit (INDEPENDENT_AMBULATORY_CARE_PROVIDER_SITE_OTHER): Payer: Medicare HMO | Admitting: Family

## 2023-01-03 VITALS — BP 107/68 | HR 88 | Temp 97.2°F | Ht 59.0 in | Wt 197.2 lb

## 2023-01-03 DIAGNOSIS — Z7985 Long-term (current) use of injectable non-insulin antidiabetic drugs: Secondary | ICD-10-CM

## 2023-01-03 DIAGNOSIS — E1169 Type 2 diabetes mellitus with other specified complication: Secondary | ICD-10-CM

## 2023-01-03 DIAGNOSIS — E785 Hyperlipidemia, unspecified: Secondary | ICD-10-CM | POA: Diagnosis not present

## 2023-01-03 DIAGNOSIS — K219 Gastro-esophageal reflux disease without esophagitis: Secondary | ICD-10-CM

## 2023-01-03 DIAGNOSIS — R112 Nausea with vomiting, unspecified: Secondary | ICD-10-CM

## 2023-01-03 DIAGNOSIS — I10 Essential (primary) hypertension: Secondary | ICD-10-CM

## 2023-01-03 MED ORDER — ONDANSETRON HCL 4 MG PO TABS: 4.0000 mg | ORAL_TABLET | Freq: Three times a day (TID) | ORAL | 0 refills | Status: DC | PRN

## 2023-01-03 NOTE — Patient Instructions (Signed)

## 2023-01-03 NOTE — Progress Notes (Signed)
Subjective:    Patient ID: [name], female    DOB: 1951-04-22, 72 y.o.   MRN: [id number]  Chief Complaint  Patient presents with   Diabetes   Pt presents to the office today for chronic follow up. She is taking Ozempic for diabetes and weight loss, however, the 1 mg dose was making her sick. We decreased to 0.25 mg.     01/03/2023    2:38 PM 12/03/2022    2:00 PM 11/01/2022    2:54 PM  Last 3 Weights  Weight (lbs) 197 lb 3.2 oz 206 lb 12.8 oz 203 lb  Weight (kg) 89.449 kg 93.804 kg 92.08 kg    She is morbid obese with a BMI of 39 and HTN and DM.  Diabetes She presents for her follow-up diabetic visit. She has type 2 diabetes mellitus. Associated symptoms include blurred vision. Pertinent negatives for diabetes include no foot paresthesias. Symptoms are stable. Risk factors for coronary artery disease include hypertension, diabetes mellitus, dyslipidemia, sedentary lifestyle and post-menopausal. She is following a generally unhealthy diet. Her overall blood glucose range is 130-140 mg/dl. Eye exam is current.  Hypertension This is a chronic problem. The current episode started more than 1 year ago. The problem has been resolved since onset. The problem is controlled. Associated symptoms include blurred vision. Pertinent negatives include no malaise/fatigue, peripheral edema or shortness of breath. Risk factors for coronary artery disease include dyslipidemia, diabetes mellitus, obesity and sedentary lifestyle. The current treatment provides moderate improvement.  Hyperlipidemia This is a chronic problem. The current episode started more than 1 year ago. Exacerbating diseases include obesity. Pertinent negatives include no shortness of breath. Current antihyperlipidemic treatment includes statins. The current treatment provides moderate improvement of lipids. Risk factors for coronary artery disease include dyslipidemia, hypertension, a sedentary lifestyle, post-menopausal and diabetes  mellitus.  Gastroesophageal Reflux She complains of belching and heartburn. This is a chronic problem. The current episode started more than 1 year ago. The problem occurs occasionally. Risk factors include obesity. The treatment provided moderate relief.      Review of Systems  Constitutional:  Negative for malaise/fatigue.  Eyes:  Positive for blurred vision.  Respiratory:  Negative for shortness of breath.   Gastrointestinal:  Positive for heartburn.  All other systems reviewed and are negative.      Objective:   Physical Exam Vitals reviewed.  Constitutional:      General: She is not in acute distress.    Appearance: She is well-developed. She is obese.  HENT:     Head: Normocephalic and atraumatic.  Eyes:     Pupils: Pupils are equal, round, and reactive to light.  Neck:     Thyroid: No thyromegaly.  Cardiovascular:     Rate and Rhythm: Normal rate and regular rhythm.     Heart sounds: Normal heart sounds. No murmur heard. Pulmonary:     Effort: Pulmonary effort is normal. No respiratory distress.     Breath sounds: Normal breath sounds. No wheezing.  Abdominal:     General: Bowel sounds are normal. There is no distension.     Palpations: Abdomen is soft.     Tenderness: There is no abdominal tenderness.  Musculoskeletal:        General: No tenderness. Normal range of motion.     Cervical back: Normal range of motion and neck supple.  Skin:    General: Skin is warm and dry.  Neurological:     Mental Status: She is alert  and oriented to person, place, and time.     Cranial Nerves: No cranial nerve deficit.     Deep Tendon Reflexes: Reflexes are normal and symmetric.  Psychiatric:        Behavior: Behavior normal.        Thought Content: Thought content normal.        Judgment: Judgment normal.       BP 107/68   Pulse 88   Temp (!) 97.2 F (36.2 C) (Temporal)   Ht 4\' 11"  (1.499 m)   Wt 197 lb 3.2 oz (89.4 kg)   SpO2 (!) 88%   BMI 39.83 kg/m       Assessment & Plan:   [name] comes in today with chief complaint of Diabetes   Diagnosis and orders addressed:  1. Type 2 diabetes mellitus with other specified complication, without long-term current use of insulin (HCC) Will continue Ozempic 0.25 mg  Zofran as needed  Low carb diet Had one episode of vomiting and diarrhea, could be related to viral.  - CMP14+EGFR  2. Essential hypertension - CMP14+EGFR  3. Gastroesophageal reflux disease, unspecified whether esophagitis present - CMP14+EGFR  4. Hyperlipidemia associated with type 2 diabetes mellitus (HCC) - CMP14+EGFR  5. Morbid obesity (HCC) - CMP14+EGFR  6. Nausea and vomiting, unspecified vomiting type Zofran as needed Force fluids  - ondansetron (ZOFRAN) 4 MG tablet; Take 1 tablet (4 mg total) by mouth every 8 (eight) hours as needed for nausea or vomiting.  Dispense: 20 tablet; Refill: 0   Labs pending Continue current medications  Zofran as needed  Continue Ozempic 0.25 mg  Health Maintenance reviewed Diet and exercise encouraged  Follow up plan: 2 months for chronic follow up   Jannifer Rodney, FNP

## 2023-01-03 NOTE — Progress Notes (Signed)
, °

## 2023-01-06 ENCOUNTER — Encounter: Payer: Self-pay | Admitting: *Deleted

## 2023-01-06 NOTE — Progress Notes (Unsigned)
GI Office Note    Referring Provider: Junie Spencer, FNP Primary Care Physician:  Junie Spencer, FNP  Primary Gastroenterologist: Roetta Sessions, MD   Chief Complaint   No chief complaint on file.   History of Present Illness   [name] is a 72 y.o. female presenting today    For follow up. Last seen 06/2022. H/o chronic GERD, colon polyps. Due for colonoscopy at this time. No prior EGD, consider Barrett's screeningn ***       Medications   Current Outpatient Medications  Medication Sig Dispense Refill   albuterol (VENTOLIN HFA) 108 (90 Base) MCG/ACT inhaler Inhale 2 puffs into the lungs every 6 (six) hours as needed for wheezing or shortness of breath. 8 g 0   aspirin EC 81 MG tablet Take 81 mg by mouth daily.     atorvastatin (LIPITOR) 10 MG tablet Take 1 tablet (10 mg total) by mouth daily. 90 tablet 3   blood glucose meter kit and supplies KIT Dispense based on patient and insurance preference. Use up to four times daily as directed. (FOR ICD-9 250.00, 250.01). 1 each 11   blood glucose meter kit and supplies Dispense based on patient and insurance preference. Check BS daily Dx E11.9 1 each 0   buPROPion (WELLBUTRIN XL) 300 MG 24 hr tablet Take 1 tablet by mouth once daily 30 tablet 0   escitalopram (LEXAPRO) 20 MG tablet Take 1 tablet (20 mg total) by mouth daily. 90 tablet 3   glucose blood test strip Check BS daily Dx E11.9 100 each 3   Lancets MISC Check BS daily Dx E11.9 100 each 3   levothyroxine (SYNTHROID) 75 MCG tablet TAKE 1 TABLET BY MOUTH ONCE DAILY BEFORE BREAKFAST 90 tablet 1   lisinopril (ZESTRIL) 10 MG tablet Take 1 tablet (10 mg total) by mouth daily. 90 tablet 3   metFORMIN (GLUCOPHAGE) 1000 MG tablet Take 1 tablet (1,000 mg total) by mouth 2 (two) times daily with a meal. 60 tablet 0   nystatin (MYCOSTATIN) 100000 UNIT/ML suspension Take 5 mLs (500,000 Units total) by mouth 4 (four) times daily. 120 mL 1   ondansetron (ZOFRAN) 4 MG  tablet Take 1 tablet (4 mg total) by mouth every 8 (eight) hours as needed for nausea or vomiting. 20 tablet 0   pantoprazole (PROTONIX) 40 MG tablet Take 1 tablet (40 mg total) by mouth daily before breakfast. Discontinue omeprazole 90 tablet 3   Semaglutide,0.25 or 0.5MG /DOS, 2 MG/3ML SOPN Inject 0.25 mg into the skin once a week. 3 mL 1   No current facility-administered medications for this visit.    Allergies   Allergies as of 01/07/2023 - Review Complete 01/03/2023  Allergen Reaction Noted   Penicillins Other (See Comments) 12/08/2012   Shrimp [shellfish allergy] Itching 12/08/2012   Sulfa antibiotics Other (See Comments) 12/08/2012     Past Medical History   Past Medical History:  Diagnosis Date   Arthritis    Depression    ONGOING PROBLEM   Diabetes mellitus without complication (HCC)    DX 2008   USES PILLS   Dizziness and giddiness 06/29/2015   GERD (gastroesophageal reflux disease)    Hypertension    Hypothyroidism    Morbidly obese (HCC)    Morton's neuroma of left foot     Past Surgical History   Past Surgical History:  Procedure Laterality Date   CARPAL TUNNEL RELEASE     LEFT   CARPAL TUNNEL RELEASE  Right 12/22/2012   Procedure: CARPAL TUNNEL RELEASE- RIGHT;  Surgeon: Cammy Copa, MD;  Location: [facility] OR;  Service: Orthopedics;  Laterality: Right;   COLONOSCOPY WITH PROPOFOL N/A 01/25/2020   Rourk: 6 polyps removed, tubular adenomas.  Next colonoscopy in 3 years.   POLYPECTOMY  01/25/2020   Procedure: POLYPECTOMY;  Surgeon: Corbin Ade, MD;  Location: [facility] SUITE;  Service: Endoscopy;;   SHOULDER ARTHROSCOPY W/ ROTATOR CUFF REPAIR     LEFT   SHOULDER ARTHROSCOPY WITH ROTATOR CUFF REPAIR Right 12/22/2012   Procedure: SHOULDER ARTHROSCOPY WITH ROTATOR CUFF REPAIR AND BICEPS TENODESIS REPAIR;  Surgeon: Cammy Copa, MD;  Location: [facility] OR;  Service: Orthopedics;  Laterality: Right;  Right Carpal Tunnel Release, Right Shoulder Arthroscopy, Rotator  Cuff Tear Repair, Biceps Tenodesis   TOE SURGERY     LEFT BIG TOE   TUBAL LIGATION      Past Family History   Family History  Problem Relation Age of Onset   Heart attack Mother    Cancer Father        brain   Heart failure Father    Dementia Sister        alzheimers   Heart disease Sister    Diabetes Sister    Hypertension Sister    Hyperlipidemia Sister    Heart failure Sister    Colon cancer Cousin        41   Cancer Brother        Prostate   Leukemia Brother    Breast cancer Neg Hx     Past Social History   Social History   Socioeconomic History   Marital status: Widowed    Spouse name: Not on file   Number of children: 1   Years of education: 23   Highest education level: Not on file  Occupational History   Occupation: disability  Tobacco Use   Smoking status: Never   Smokeless tobacco: Never  Vaping Use   Vaping status: Never Used  Substance and Sexual Activity   Alcohol use: No   Drug use: No   Sexual activity: Not on file  Other Topics Concern   Not on file  Social History Narrative   Patient drinks 1 soda daily.   Patient is left handed.    Daughter lives with her   Social Determinants of Health   Financial Resource Strain: Low Risk  (10/15/2022)   Overall Financial Resource Strain (CARDIA)    Difficulty of Paying Living Expenses: Not hard at all  Food Insecurity: No Food Insecurity (10/15/2022)   Hunger Vital Sign    Worried About Running Out of Food in the Last Year: Never true    Ran Out of Food in the Last Year: Never true  Transportation Needs: No Transportation Needs (10/15/2022)   PRAPARE - Administrator, Civil Service (Medical): No    Lack of Transportation (Non-Medical): No  Physical Activity: Insufficiently Active (10/15/2022)   Exercise Vital Sign    Days of Exercise per Week: 3 days    Minutes of Exercise per Session: 30 min  Stress: No Stress Concern Present (10/15/2022)   [employer] of Occupational Health  - Occupational Stress Questionnaire    Feeling of Stress : Not at all  Social Connections: Socially Isolated (10/15/2022)   Social Connection and Isolation Panel [NHANES]    Frequency of Communication with Friends and Family: More than three times a week    Frequency of Social Gatherings with  Friends and Family: More than three times a week    Attends Religious Services: Never    Database administrator or Organizations: No    Attends Banker Meetings: Never    Marital Status: Widowed  Intimate Partner Violence: Not At Risk (10/15/2022)   Humiliation, Afraid, Rape, and Kick questionnaire    Fear of Current or Ex-Partner: No    Emotionally Abused: No    Physically Abused: No    Sexually Abused: No    Review of Systems   General: Negative for anorexia, weight loss, fever, chills, fatigue, weakness. ENT: Negative for hoarseness, difficulty swallowing , nasal congestion. CV: Negative for chest pain, angina, palpitations, dyspnea on exertion, peripheral edema.  Respiratory: Negative for dyspnea at rest, dyspnea on exertion, cough, sputum, wheezing.  GI: See history of present illness. GU:  Negative for dysuria, hematuria, urinary incontinence, urinary frequency, nocturnal urination.  Endo: Negative for unusual weight change.     Physical Exam   There were no vitals taken for this visit.   General: Well-nourished, well-developed in no acute distress.  Eyes: No icterus. Mouth: Oropharyngeal mucosa moist and pink , no lesions erythema or exudate. Lungs: Clear to auscultation bilaterally.  Heart: Regular rate and rhythm, no murmurs rubs or gallops.  Abdomen: Bowel sounds are normal, nontender, nondistended, no hepatosplenomegaly or masses,  no abdominal bruits or hernia , no rebound or guarding.  Rectal: ***  Extremities: No lower extremity edema. No clubbing or deformities. Neuro: Alert and oriented x 4   Skin: Warm and dry, no jaundice.   Psych: Alert and cooperative,  normal mood and affect.  Labs   Lab Results  Component Value Date   NA 138 01/03/2023   CL 101 01/03/2023   K 5.2 01/03/2023   CO2 17 (L) 01/03/2023   BUN 13 01/03/2023   CREATININE 0.94 01/03/2023   EGFR 65 01/03/2023   CALCIUM 9.9 01/03/2023   ALBUMIN 4.1 01/03/2023   GLUCOSE 169 (H) 01/03/2023   Lab Results  Component Value Date   ALT 21 01/03/2023   AST 25 01/03/2023   ALKPHOS 64 01/03/2023   BILITOT 0.3 01/03/2023   Lab Results  Component Value Date   WBC 7.6 12/03/2022   HGB 10.5 (L) 12/03/2022   HCT 33.3 (L) 12/03/2022   MCV 87 12/03/2022   PLT 255 12/03/2022   Lab Results  Component Value Date   HGBA1C 9.1 (H) 12/03/2022   Lab Results  Component Value Date   TSH 2.390 12/03/2022     Imaging Studies   No results found.  Assessment       PLAN   ***   Leanna Battles. Melvyn Neth, MHS, PA-C [facility]

## 2023-01-07 ENCOUNTER — Other Ambulatory Visit: Payer: Self-pay | Admitting: *Deleted

## 2023-01-07 ENCOUNTER — Ambulatory Visit: Payer: Medicare HMO | Admitting: Gastroenterology

## 2023-01-07 ENCOUNTER — Encounter: Payer: Self-pay | Admitting: Gastroenterology

## 2023-01-07 ENCOUNTER — Encounter: Payer: Self-pay | Admitting: *Deleted

## 2023-01-07 VITALS — BP 92/57 | HR 71 | Temp 97.5°F | Ht 59.0 in | Wt 194.6 lb

## 2023-01-07 DIAGNOSIS — Z86010 Personal history of colonic polyps: Secondary | ICD-10-CM

## 2023-01-07 DIAGNOSIS — K219 Gastro-esophageal reflux disease without esophagitis: Secondary | ICD-10-CM

## 2023-01-07 DIAGNOSIS — Z860101 Personal history of adenomatous and serrated colon polyps: Secondary | ICD-10-CM | POA: Insufficient documentation

## 2023-01-07 DIAGNOSIS — R1319 Other dysphagia: Secondary | ICD-10-CM

## 2023-01-07 DIAGNOSIS — D649 Anemia, unspecified: Secondary | ICD-10-CM

## 2023-01-07 MED ORDER — PEG 3350-KCL-NA BICARB-NACL 420 G PO SOLR: 4000.0000 mL | Freq: Once | ORAL | 0 refills | Status: AC

## 2023-01-07 NOTE — Patient Instructions (Signed)
Continue pantoprazole 40mg  daily before breakfast. Upper endoscopy and colonoscopy to be scheduled.

## 2023-01-17 ENCOUNTER — Ambulatory Visit (INDEPENDENT_AMBULATORY_CARE_PROVIDER_SITE_OTHER): Payer: Medicare HMO | Admitting: Pharmacist

## 2023-01-17 ENCOUNTER — Telehealth: Payer: Medicare HMO

## 2023-01-17 DIAGNOSIS — Z7985 Long-term (current) use of injectable non-insulin antidiabetic drugs: Secondary | ICD-10-CM

## 2023-01-17 DIAGNOSIS — E1165 Type 2 diabetes mellitus with hyperglycemia: Secondary | ICD-10-CM | POA: Diagnosis not present

## 2023-01-17 NOTE — Progress Notes (Signed)
01/17/2023 Name: [name] MRN: [id number] DOB: 1950-08-13  No chief complaint on file.   [name] is Mcintyre 72 y.o. year old female who was referred for medication management by their primary care provider, Madison Spencer, Mcintyre. They presented for Mcintyre face to face visit today.   They were referred to the pharmacist by their PCP for assistance in managing diabetes, medication access, and complex medication management  Patient's most recent A1c was not controlled at 9.1% due to stopping Ozempic for about 4 months prior.  She has recently restarted  Ozempic 0.25mg  weekly and has been tolerating this dose for about 1 month.  She reports her blood sugars have improved.   Subjective:  Care Team: Primary Care Provider: Junie Spencer, Mcintyre ; Next Scheduled Visit: 02/2023  Medication Access/Adherence  Current Pharmacy:  [facility] [address], [place] - [address] [place] [address] Phone: [phone] Fax: [phone]   Patient reports affordability concerns with their medications: Yes  Patient reports access/transportation concerns to their pharmacy: No  Patient reports adherence concerns with their medications:  No     Diabetes:  Current medications:  OZEMPIC 0.25mg  weekly, metformin Medications tried in the past: n/Mcintyre patient denies personal or family history of medullary thyroid carcinoma (MTC) or in patients with Multiple Endocrine Neoplasia syndrome type 2 (MEN 2)  Current glucose readings: FBG in office today 156, reports FBG 110-130s Using contour meter? But filling one touch verio--pt to clarify; testing 3 times weekly  Patient denies hypoglycemic s/sx including dizziness, shakiness, sweating. Patient denies hyperglycemic symptoms including polyuria, polydipsia, polyphagia, nocturia, neuropathy, blurred vision.  Current meal patterns:  Reports only eating 1 meal Mcintyre day Encouraged at least 2 healthy plate meals daily - Drinks  water, sweet tea, coffee  Current physical activity: encouraged as able  Current medication access support: will need Novo nordisk PAP for ozempic   Objective:  Lab Results  Component Value Date   HGBA1C 9.1 (H) 12/03/2022    Lab Results  Component Value Date   CREATININE 0.94 01/03/2023   BUN 13 01/03/2023   NA 138 01/03/2023   K 5.2 01/03/2023   CL 101 01/03/2023   CO2 17 (L) 01/03/2023    Lab Results  Component Value Date   CHOL 149 12/03/2022   HDL 54 12/03/2022   LDLCALC 72 12/03/2022   TRIG 133 12/03/2022   CHOLHDL 2.8 12/03/2022    Medications Reviewed Today     Reviewed by Danella Maiers, [facility] (Pharmacist) on 01/17/23 at 1013  Med List Status: <None>   Medication Order Taking? Sig Documenting Provider Last Dose Status Informant  albuterol (VENTOLIN HFA) 108 (90 Base) MCG/ACT inhaler [id number] No Inhale 2 puffs into the lungs every 6 (six) hours as needed for wheezing or shortness of breath. Madison Spencer, Mcintyre Taking Active   aspirin EC 81 MG tablet [id number] No Take 81 mg by mouth daily. [provider] Taking Active Self  atorvastatin (LIPITOR) 10 MG tablet [id number] No Take 1 tablet (10 mg total) by mouth daily. Madison Spencer, Mcintyre Taking Active   blood glucose meter kit and supplies [id number] No Dispense based on patient and insurance preference. Check BS daily Dx E11.9 Madison Spencer, Mcintyre Taking Active   blood glucose meter kit and supplies KIT [id number] No Dispense based on patient and insurance preference. Use up to four times daily as directed. (FOR ICD-9 250.00, 250.01). Madison Loffler, PA-C  Taking Active Self  buPROPion (WELLBUTRIN XL) 300 MG 24 hr tablet [id number] No Take 1 tablet by mouth once daily Madison Rodney Mcintyre, Mcintyre Taking Active   escitalopram (LEXAPRO) 20 MG tablet [id number] No Take 1 tablet (20 mg total) by mouth daily. Madison Spencer, Mcintyre Taking Active   glucose blood test strip [id number] No Check BS daily Dx E11.9 Madison Spencer, Mcintyre Taking Active   Lancets MISC [id number] No Check BS daily Dx E11.9 Madison Spencer, Mcintyre Taking Active   levothyroxine (SYNTHROID) 75 MCG tablet [id number] No TAKE 1 TABLET BY MOUTH ONCE DAILY BEFORE BREAKFAST Hawks, [name] Taking Active   lisinopril (ZESTRIL) 10 MG tablet [id number] No Take 1 tablet (10 mg total) by mouth daily. Madison Spencer, Mcintyre Taking Active   metFORMIN (GLUCOPHAGE) 1000 MG tablet [id number] No Take 1 tablet (1,000 mg total) by mouth 2 (two) times daily with Mcintyre meal. Madison Rodney Mcintyre, Mcintyre Taking Active   nystatin (MYCOSTATIN) 100000 UNIT/ML suspension [id number] No Take 5 mLs (500,000 Units total) by mouth 4 (four) times daily. Madison Spencer, Mcintyre Taking Active   ondansetron (ZOFRAN) 4 MG tablet [id number] No Take 1 tablet (4 mg total) by mouth every 8 (eight) hours as needed for nausea or vomiting. Madison Spencer, Mcintyre Taking Active   pantoprazole (PROTONIX) 40 MG tablet [id number] No Take 1 tablet (40 mg total) by mouth daily before breakfast. Discontinue omeprazole Madison Kocher, PA-C Taking Active   Semaglutide,0.25 or 0.5MG /DOS, 2 MG/3ML SOPN [id number] No Inject 0.25 mg into the skin once Mcintyre week. Madison Spencer, Mcintyre Taking Active             Assessment/Plan:   Diabetes: - Currently uncontrolled, but improving now that patient is back on Ozempic 0.25mg  weekly; she is tolerating the low dose well; will not increase to 0.5mg  at this time - Reviewed long term cardiovascular and renal outcomes of uncontrolled blood sugar - Reviewed goal A1c, goal fasting, and goal 2 hour post prandial glucose - Reviewed dietary modifications including FOLLOWING Mcintyre HEART HEALTHY DIET/HEALTHY PLATE METHOD - Recommend to : Continue Ozempic 0.25mg  weekly  Continue metformin  - Recommend to check glucose daily (fasting) or if symptomatic - Meets financial criteria for Ozempic patient assistance program through [employer]. Will collaborate with provider, CPhT, and  patient to pursue assistance.  Prepared and submitted Novo nordisk PAP   Follow Up Plan: PCP In 02/2023   [name], PharmD, BCACP Clinical Pharmacist, [facility]

## 2023-01-21 ENCOUNTER — Telehealth: Payer: Self-pay

## 2023-01-21 NOTE — Telephone Encounter (Signed)
Submitted application for OZEMPIC to [employer] for patient assistance.   (Per [name])  Phone: [phone]

## 2023-01-31 ENCOUNTER — Other Ambulatory Visit: Payer: Self-pay | Admitting: Family

## 2023-01-31 DIAGNOSIS — E1169 Type 2 diabetes mellitus with other specified complication: Secondary | ICD-10-CM

## 2023-02-03 ENCOUNTER — Other Ambulatory Visit: Payer: Self-pay | Admitting: Family

## 2023-02-03 DIAGNOSIS — F3341 Major depressive disorder, recurrent, in partial remission: Secondary | ICD-10-CM

## 2023-02-07 ENCOUNTER — Encounter (HOSPITAL_COMMUNITY): Payer: Medicare HMO

## 2023-02-07 ENCOUNTER — Encounter (HOSPITAL_COMMUNITY): Payer: Self-pay

## 2023-02-07 ENCOUNTER — Encounter (HOSPITAL_COMMUNITY)
Admission: RE | Admit: 2023-02-07 | Discharge: 2023-02-07 | Disposition: A | Discharge status: Home / Self Care | Payer: Medicare HMO | Source: Ambulatory Visit | Attending: Internal Medicine | Admitting: Internal Medicine

## 2023-02-07 VITALS — BP 133/55 | HR 86 | Temp 97.8°F | Resp 18 | Ht 59.0 in | Wt 194.7 lb

## 2023-02-07 DIAGNOSIS — Z01810 Encounter for preprocedural cardiovascular examination: Secondary | ICD-10-CM | POA: Diagnosis not present

## 2023-02-07 DIAGNOSIS — K219 Gastro-esophageal reflux disease without esophagitis: Secondary | ICD-10-CM | POA: Diagnosis not present

## 2023-02-07 NOTE — Patient Instructions (Signed)
[name]  02/07/2023     @PREFPERIOPPHARMACY @   Your procedure is scheduled on 02/12/23.  Report to [facility] at 0915 A.M.  Call this number if you have problems the morning of surgery:  [phone]  If you experience any cold or flu symptoms such as cough, fever, chills, shortness of breath, etc. between now and your scheduled surgery, please notify us at the above number.   Remember:  Do not eat or drink after midnight.      Take these medicines the morning of surgery with A SIP OF WATER wellbutrin, lexapro, synthroid & zofran if needed.  Use albuterol inhaler & bring it with you.    Do not wear jewelry, make-up or nail polish, including gel polish,  artificial nails, or any other type of covering on natural nails (fingers and  toes).  Do not wear lotions, powders, or perfumes, or deodorant.  Do not shave 48 hours prior to surgery.  Men may shave face and neck.  Do not bring valuables to the hospital.  [facility] is not responsible for any belongings or valuables.  Contacts, dentures or bridgework may not be worn into surgery.  Leave your suitcase in the car.  After surgery it may be brought to your room.  For patients admitted to the hospital, discharge time will be determined by your treatment team.  Patients discharged the day of surgery will not be allowed to drive home.   Name and phone number of your driver:   family Special instructions:  FOLLOW PREP & DIET INSTRUCTIONS PROVIDED BY OFFICE  Please read over the following fact sheets that you were given. Anesthesia Post-op Instructions and Care and Recovery After Surgery      PATIENT INSTRUCTIONS POST-ANESTHESIA  IMMEDIATELY FOLLOWING SURGERY:  Do not drive or operate machinery for the first twenty four hours after surgery.  Do not make any important decisions for twenty four hours after surgery or while taking narcotic pain medications or sedatives.  If you develop intractable nausea and vomiting or a  severe headache please notify your doctor immediately.  FOLLOW-UP:  Please make an appointment with your surgeon as instructed. You do not need to follow up with anesthesia unless specifically instructed to do so.  WOUND CARE INSTRUCTIONS (if applicable):  Keep a dry clean dressing on the anesthesia/puncture wound site if there is drainage.  Once the wound has quit draining you may leave it open to air.  Generally you should leave the bandage intact for twenty four hours unless there is drainage.  If the epidural site drains for more than 36-48 hours please call the anesthesia department.  QUESTIONS?:  Please feel free to call your physician or the hospital operator if you have any questions, and they will be happy to assist you.      Upper Endoscopy, Adult, Care After After the procedure, it is common to have a sore throat. It is also common to have: Mild stomach pain or discomfort. Bloating. Nausea. Follow these instructions at home: The instructions below may help you care for yourself at home. Your health care provider may give you more instructions. If you have questions, ask your health care provider. If you were given a sedative during the procedure, it can affect you for several hours. Do not drive or operate machinery until your health care provider says that it is safe. If you will be going home right after the procedure, plan to have a responsible adult: Take you home  from the hospital or clinic. You will not be allowed to drive. Care for you for the time you are told. Follow instructions from your health care provider about what you may eat and drink. Return to your normal activities as told by your health care provider. Ask your health care provider what activities are safe for you. Take over-the-counter and prescription medicines only as told by your health care provider. Contact a health care provider if you: Have a sore throat that lasts longer than one day. Have trouble  swallowing. Have a fever. Get help right away if you: Vomit blood or your vomit looks like coffee grounds. Have bloody, black, or tarry stools. Have a very bad sore throat or you cannot swallow. Have difficulty breathing or very bad pain in your chest or abdomen. These symptoms may be an emergency. Get help right away. Call 911. Do not wait to see if the symptoms will go away. Do not drive yourself to the hospital. Summary After the procedure, it is common to have a sore throat, mild stomach discomfort, bloating, and nausea. If you were given a sedative during the procedure, it can affect you for several hours. Do not drive until your health care provider says that it is safe. Follow instructions from your health care provider about what you may eat and drink. Return to your normal activities as told by your health care provider. This information is not intended to replace advice given to you by your health care provider. Make sure you discuss any questions you have with your health care provider. Document Revised: 08/22/2021 Document Reviewed: 08/22/2021 Elsevier Patient Education  2024 [employer] Upper Endoscopy, Adult Upper endoscopy is a procedure to look inside the upper GI (gastrointestinal) tract. The upper GI tract is made up of: The esophagus. This is the part of the body that moves food from your mouth to your stomach. The stomach. The duodenum. This is the first part of your small intestine. This procedure is also called esophagogastroduodenoscopy (EGD) or gastroscopy. In this procedure, your health care provider passes a thin, flexible tube (endoscope) through your mouth and down your esophagus into your stomach and into your duodenum. A small camera is attached to the end of the tube. Images from the camera appear on a monitor in the exam room. During this procedure, your health care provider may also remove a small piece of tissue to be sent to a lab and examined under a  microscope (biopsy). Your health care provider may do an upper endoscopy to diagnose cancers of the upper GI tract. You may also have this procedure to find the cause of other conditions, such as: Stomach pain. Heartburn. Pain or problems when swallowing. Nausea and vomiting. Stomach bleeding. Stomach ulcers. Tell a health care provider about: Any allergies you have. All medicines you are taking, including vitamins, herbs, eye drops, creams, and over-the-counter medicines. Any problems you or family members have had with anesthetic medicines. Any bleeding problems you have. Any surgeries you have had. Any medical conditions you have. Whether you are pregnant or may be pregnant. What are the risks? Your healthcare provider will talk with you about risks. These may include: Infection. Bleeding. Allergic reactions to medicines. A tear or hole (perforation) in the esophagus, stomach, or duodenum. What happens before the procedure? When to stop eating and drinking Follow instructions from your health care provider about what you may eat and drink. These may include: 8 hours before your procedure Stop eating most foods.  Do not eat meat, fried foods, or fatty foods. Eat only light foods, such as toast or crackers. All liquids are okay except energy drinks and alcohol. 6 hours before your procedure Stop eating. Drink only clear liquids, such as water, clear fruit juice, black coffee, plain tea, and sports drinks. Do not drink energy drinks or alcohol. 2 hours before your procedure Stop drinking all liquids. You may be allowed to take medicines with small sips of water. If you do not follow your health care provider's instructions, your procedure may be delayed or canceled. Medicines Ask your health care provider about: Changing or stopping your regular medicines. This is especially important if you are taking diabetes medicines or blood thinners. Taking medicines such as aspirin and  ibuprofen. These medicines can thin your blood. Do not take these medicines unless your health care provider tells you to take them. Taking over-the-counter medicines, vitamins, herbs, and supplements. General instructions If you will be going home right after the procedure, plan to have a responsible adult: Take you home from the hospital or clinic. You will not be allowed to drive. Care for you for the time you are told. What happens during the procedure?  An IV will be inserted into one of your veins. You may be given one or more of the following: A medicine to help you relax (sedative). A medicine to numb the throat (local anesthetic). You will lie on your left side on an exam table. Your health care provider will pass the endoscope through your mouth and down your esophagus. Your health care provider will use the scope to check the inside of your esophagus, stomach, and duodenum. Biopsies may be taken. The endoscope will be removed. The procedure may vary among health care providers and hospitals. What happens after the procedure? Your blood pressure, heart rate, breathing rate, and blood oxygen level will be monitored until you leave the hospital or clinic. When your throat is no longer numb, you may be given some fluids to drink. If you were given a sedative during the procedure, it can affect you for several hours. Do not drive or operate machinery until your health care provider says that it is safe. It is up to you to get the results of your procedure. Ask your health care provider, or the department that is doing the procedure, when your results will be ready. Contact a health care provider if you: Have a sore throat that lasts longer than 1 day. Have a fever. Get help right away if you: Vomit blood or your vomit looks like coffee grounds. Have bloody, black, or tarry stools. Have a very bad sore throat or you cannot swallow. Have difficulty breathing or very bad pain in your  chest or abdomen. These symptoms may be an emergency. Get help right away. Call 911. Do not wait to see if the symptoms will go away. Do not drive yourself to the hospital. Summary Upper endoscopy is a procedure to look inside the upper GI tract. During the procedure, an IV will be inserted into one of your veins. You may be given a medicine to help you relax. The endoscope will be passed through your mouth and down your esophagus. Follow instructions from your health care provider about what you can eat and drink. This information is not intended to replace advice given to you by your health care provider. Make sure you discuss any questions you have with your health care provider. Document Revised: 08/22/2021 Document Reviewed: 08/22/2021 Elsevier  Patient Education  2024 [employer] Colonoscopy, Adult A colonoscopy is a procedure to look at the entire large intestine. This procedure is done using a long, thin, flexible tube that has a camera on the end. You may have a colonoscopy: As a part of normal colorectal screening. If you have certain symptoms, such as: A low number of red blood cells in your blood (anemia). Diarrhea that does not go away. Pain in your abdomen. Blood in your stool. A colonoscopy can help screen for and diagnose medical problems, including: An abnormal growth of cells or tissue (tumor). Abnormal growths within the lining of your intestine (polyps). Inflammation. Areas of bleeding. Tell your health care provider about: Any allergies you have. All medicines you are taking, including vitamins, herbs, eye drops, creams, and over-the-counter medicines. Any problems you or family members have had with anesthetic medicines. Any bleeding problems you have. Any surgeries you have had. Any medical conditions you have. Any problems you have had with having bowel movements. Whether you are pregnant or may be pregnant. What are the risks? Generally, this is a safe  procedure. However, problems may occur, including: Bleeding. Damage to your intestine. Allergic reactions to medicines given during the procedure. Infection. This is rare. What happens before the procedure? Eating and drinking restrictions Follow instructions from your health care provider about eating or drinking restrictions, which may include: A few days before the procedure: Follow a low-fiber diet. Avoid nuts, seeds, dried fruit, raw fruits, and vegetables. 1-3 days before the procedure: Eat only gelatin dessert or ice pops. Drink only clear liquids, such as water, clear juice, clear broth or bouillon, black coffee or tea, or clear soft drinks or sports drinks. Avoid liquids that contain red or purple dye. The day of the procedure: Do not eat solid foods. You may continue to drink clear liquids until up to 2 hours before the procedure. Do not eat or drink anything starting 2 hours before the procedure, or within the time period that your health care provider recommends. Bowel prep If you were prescribed a bowel prep to take by mouth (orally) to clean out your colon: Take it as told by your health care provider. Starting the day before your procedure, you will need to drink a large amount of liquid medicine. The liquid will cause you to have many bowel movements of loose stool until your stool becomes almost clear or light green. If your skin or the opening between the buttocks (anus) gets irritated from diarrhea, you may relieve the irritation using: Wipes with medicine in them, such as adult wet wipes with aloe and vitamin E. A product to soothe skin, such as petroleum jelly. If you vomit while drinking the bowel prep: Take a break for up to 60 minutes. Begin the bowel prep again. Call your health care provider if you keep vomiting or you cannot take the bowel prep without vomiting. To clean out your colon, you may also be given: Laxative medicines. These help you have a bowel  movement. Instructions for enema use. An enema is liquid medicine injected into your rectum. Medicines Ask your health care provider about: Changing or stopping your regular medicines or supplements. This is especially important if you are taking iron supplements, diabetes medicines, or blood thinners. Taking medicines such as aspirin and ibuprofen. These medicines can thin your blood. Do not take these medicines unless your health care provider tells you to take them. Taking over-the-counter medicines, vitamins, herbs, and supplements. General instructions Ask your  health care provider what steps will be taken to help prevent infection. These may include washing skin with a germ-killing soap. If you will be going home right after the procedure, plan to have a responsible adult: Take you home from the hospital or clinic. You will not be allowed to drive. Care for you for the time you are told. What happens during the procedure?  An IV will be inserted into one of your veins. You will be given a medicine to make you fall asleep (general anesthetic). You will lie on your side with your knees bent. A lubricant will be put on the tube. Then the tube will be: Inserted into your anus. Gently eased through all parts of your large intestine. Air will be sent into your colon to keep it open. This may cause some pressure or cramping. Images will be taken with the camera and will appear on a screen. A small tissue sample may be removed to be looked at under a microscope (biopsy). The tissue may be sent to a lab for testing if any signs of problems are found. If small polyps are found, they may be removed and checked for cancer cells. When the procedure is finished, the tube will be removed. The procedure may vary among health care providers and hospitals. What happens after the procedure? Your blood pressure, heart rate, breathing rate, and blood oxygen level will be monitored until you leave the  hospital or clinic. You may have a small amount of blood in your stool. You may pass gas and have mild cramping or bloating in your abdomen. This is caused by the air that was used to open your colon during the exam. If you were given a sedative during the procedure, it can affect you for several hours. Do not drive or operate machinery until your health care provider says that it is safe. It is up to you to get the results of your procedure. Ask your health care provider, or the department that is doing the procedure, when your results will be ready. Summary A colonoscopy is a procedure to look at the entire large intestine. Follow instructions from your health care provider about eating and drinking before the procedure. If you were prescribed an oral bowel prep to clean out your colon, take it as told by your health care provider. During the colonoscopy, a flexible tube with a camera on its end is inserted into the anus and then passed into all parts of the large intestine. This information is not intended to replace advice given to you by your health care provider. Make sure you discuss any questions you have with your health care provider. Document Revised: 06/25/2022 Document Reviewed: 01/03/2021 Elsevier Patient Education  2024 [employer]. Colonoscopy, Adult, Care After The following information offers guidance on how to care for yourself after your procedure. Your health care provider may also give you more specific instructions. If you have problems or questions, contact your health care provider. What can I expect after the procedure? After the procedure, it is common to have: A small amount of blood in your stool for 24 hours after the procedure. Some gas. Mild cramping or bloating of your abdomen. Follow these instructions at home: Eating and drinking  Drink enough fluid to keep your urine pale yellow. Follow instructions from your health care provider about eating or drinking  restrictions. Resume your normal diet as told by your health care provider. Avoid heavy or fried foods that are hard to  digest. Activity Rest as told by your health care provider. Avoid sitting for a long time without moving. Get up to take short walks every 1-2 hours. This is important to improve blood flow and breathing. Ask for help if you feel weak or unsteady. Return to your normal activities as told by your health care provider. Ask your health care provider what activities are safe for you. Managing cramping and bloating  Try walking around when you have cramps or feel bloated. If directed, apply heat to your abdomen as told by your health care provider. Use the heat source that your health care provider recommends, such as a moist heat pack or a heating pad. Place a towel between your skin and the heat source. Leave the heat on for 20-30 minutes. Remove the heat if your skin turns bright red. This is especially important if you are unable to feel pain, heat, or cold. You have a greater risk of getting burned. General instructions If you were given a sedative during the procedure, it can affect you for several hours. Do not drive or operate machinery until your health care provider says that it is safe. For the first 24 hours after the procedure: Do not sign important documents. Do not drink alcohol. Do your regular daily activities at a slower pace than normal. Eat soft foods that are easy to digest. Take over-the-counter and prescription medicines only as told by your health care provider. Keep all follow-up visits. This is important. Contact a health care provider if: You have blood in your stool 2-3 days after the procedure. Get help right away if: You have more than a small spotting of blood in your stool. You have large blood clots in your stool. You have swelling of your abdomen. You have nausea or vomiting. You have a fever. You have increasing pain in your abdomen that  is not relieved with medicine. These symptoms may be an emergency. Get help right away. Call 911. Do not wait to see if the symptoms will go away. Do not drive yourself to the hospital. Summary After the procedure, it is common to have a small amount of blood in your stool. You may also have mild cramping and bloating of your abdomen. If you were given a sedative during the procedure, it can affect you for several hours. Do not drive or operate machinery until your health care provider says that it is safe. Get help right away if you have a lot of blood in your stool, nausea or vomiting, a fever, or increased pain in your abdomen. This information is not intended to replace advice given to you by your health care provider. Make sure you discuss any questions you have with your health care provider. Document Revised: 06/25/2022 Document Reviewed: 01/03/2021 Elsevier Patient Education  2024 [employer].

## 2023-02-12 ENCOUNTER — Ambulatory Visit (HOSPITAL_COMMUNITY): Payer: Medicare HMO | Admitting: Anesthesiology

## 2023-02-12 ENCOUNTER — Encounter (HOSPITAL_COMMUNITY): Payer: Self-pay | Admitting: Internal Medicine

## 2023-02-12 ENCOUNTER — Encounter (HOSPITAL_COMMUNITY)
Admission: RE | Disposition: A | Discharge status: Home / Self Care | Payer: Self-pay | Source: Home / Self Care | Attending: Internal Medicine

## 2023-02-12 ENCOUNTER — Ambulatory Visit (HOSPITAL_COMMUNITY)
Admission: RE | Admit: 2023-02-12 | Discharge: 2023-02-12 | Disposition: A | Discharge status: Home / Self Care | Payer: Medicare HMO | Attending: Internal Medicine | Admitting: Internal Medicine

## 2023-02-12 DIAGNOSIS — K635 Polyp of colon: Secondary | ICD-10-CM

## 2023-02-12 DIAGNOSIS — Z1211 Encounter for screening for malignant neoplasm of colon: Secondary | ICD-10-CM | POA: Diagnosis not present

## 2023-02-12 DIAGNOSIS — I10 Essential (primary) hypertension: Secondary | ICD-10-CM | POA: Insufficient documentation

## 2023-02-12 DIAGNOSIS — Z7984 Long term (current) use of oral hypoglycemic drugs: Secondary | ICD-10-CM | POA: Diagnosis not present

## 2023-02-12 DIAGNOSIS — F32A Depression, unspecified: Secondary | ICD-10-CM | POA: Diagnosis not present

## 2023-02-12 DIAGNOSIS — Z8249 Family history of ischemic heart disease and other diseases of the circulatory system: Secondary | ICD-10-CM | POA: Diagnosis not present

## 2023-02-12 DIAGNOSIS — K219 Gastro-esophageal reflux disease without esophagitis: Secondary | ICD-10-CM

## 2023-02-12 DIAGNOSIS — G709 Myoneural disorder, unspecified: Secondary | ICD-10-CM | POA: Insufficient documentation

## 2023-02-12 DIAGNOSIS — R1310 Dysphagia, unspecified: Secondary | ICD-10-CM | POA: Diagnosis not present

## 2023-02-12 DIAGNOSIS — Z833 Family history of diabetes mellitus: Secondary | ICD-10-CM | POA: Insufficient documentation

## 2023-02-12 DIAGNOSIS — Z7985 Long-term (current) use of injectable non-insulin antidiabetic drugs: Secondary | ICD-10-CM | POA: Diagnosis not present

## 2023-02-12 DIAGNOSIS — E039 Hypothyroidism, unspecified: Secondary | ICD-10-CM | POA: Diagnosis not present

## 2023-02-12 DIAGNOSIS — Z86010 Personal history of colonic polyps: Secondary | ICD-10-CM

## 2023-02-12 DIAGNOSIS — Z6839 Body mass index (BMI) 39.0-39.9, adult: Secondary | ICD-10-CM | POA: Insufficient documentation

## 2023-02-12 DIAGNOSIS — K5140 Inflammatory polyps of colon without complications: Secondary | ICD-10-CM | POA: Diagnosis not present

## 2023-02-12 DIAGNOSIS — E119 Type 2 diabetes mellitus without complications: Secondary | ICD-10-CM | POA: Insufficient documentation

## 2023-02-12 DIAGNOSIS — Z8719 Personal history of other diseases of the digestive system: Secondary | ICD-10-CM | POA: Diagnosis not present

## 2023-02-12 DIAGNOSIS — K5730 Diverticulosis of large intestine without perforation or abscess without bleeding: Secondary | ICD-10-CM

## 2023-02-12 DIAGNOSIS — R12 Heartburn: Secondary | ICD-10-CM | POA: Insufficient documentation

## 2023-02-12 HISTORY — PX: POLYPECTOMY: SHX149

## 2023-02-12 HISTORY — PX: COLONOSCOPY WITH PROPOFOL: SHX5780

## 2023-02-12 HISTORY — PX: ESOPHAGOGASTRODUODENOSCOPY (EGD) WITH PROPOFOL: SHX5813

## 2023-02-12 LAB — GLUCOSE, CAPILLARY: Glucose-Capillary: 110 mg/dL — ABNORMAL HIGH (ref 70–99)

## 2023-02-12 SURGERY — COLONOSCOPY WITH PROPOFOL
Anesthesia: General

## 2023-02-12 MED ORDER — PROPOFOL 500 MG/50ML IV EMUL
INTRAVENOUS | Status: DC | PRN
  Administered 2023-02-12: 150 ug/kg/min via INTRAVENOUS

## 2023-02-12 MED ORDER — PROPOFOL 10 MG/ML IV BOLUS
INTRAVENOUS | Status: DC | PRN
  Administered 2023-02-12 (×2): 50 mg via INTRAVENOUS

## 2023-02-12 MED ORDER — LACTATED RINGERS IV SOLN: INTRAVENOUS | Status: DC

## 2023-02-12 NOTE — Transfer of Care (Signed)
Immediate Anesthesia Transfer of Care Note  Patient: [name]  Procedure(s) Performed: COLONOSCOPY WITH PROPOFOL ESOPHAGOGASTRODUODENOSCOPY (EGD) WITH PROPOFOL POLYPECTOMY INTESTINAL  Patient Location: Short Stay  Anesthesia Type:General  Level of Consciousness: awake, alert , drowsy, and patient cooperative  Airway & Oxygen Therapy: Patient Spontanous Breathing  Post-op Assessment: Report given to RN, Post -op Vital signs reviewed and stable, and Patient moving all extremities X 4  Post vital signs: Reviewed and stable  Last Vitals:  Vitals Value Taken Time  BP 95/49 02/12/23 1347  Temp 36.4 C 02/12/23 1343  Pulse 56 02/12/23 1343  Resp 23 02/12/23 1343  SpO2 100 % 02/12/23 1343    Last Pain:  Vitals:   02/12/23 1347  TempSrc:   PainSc: 0-No pain         Complications: No notable events documented.

## 2023-02-12 NOTE — Anesthesia Preprocedure Evaluation (Signed)
Anesthesia Evaluation  Patient identified by MRN, date of birth, ID band Patient awake    Reviewed: Allergy & Precautions, H&P , NPO status , Patient's Chart, lab work & pertinent test results, reviewed documented beta blocker date and time   Airway Mallampati: II  TM Distance: >3 FB Neck ROM: full    Dental no notable dental hx.    Pulmonary neg pulmonary ROS   Pulmonary exam normal breath sounds clear to auscultation       Cardiovascular Exercise Tolerance: Good hypertension, negative cardio ROS  Rhythm:regular Rate:Normal     Neuro/Psych  PSYCHIATRIC DISORDERS  Depression     Neuromuscular disease negative neurological ROS  negative psych ROS   GI/Hepatic negative GI ROS, Neg liver ROS,GERD  ,,  Endo/Other  negative endocrine ROSdiabetesHypothyroidism    Renal/GU negative Renal ROS  negative genitourinary   Musculoskeletal   Abdominal   Peds  Hematology negative hematology ROS (+) Blood dyscrasia, anemia   Anesthesia Other Findings   Reproductive/Obstetrics negative OB ROS                             Anesthesia Physical Anesthesia Plan  ASA: 3  Anesthesia Plan: General   Post-op Pain Management:    Induction:   PONV Risk Score and Plan: Propofol infusion  Airway Management Planned:   Additional Equipment:   Intra-op Plan:   Post-operative Plan:   Informed Consent: I have reviewed the patients History and Physical, chart, labs and discussed the procedure including the risks, benefits and alternatives for the proposed anesthesia with the patient or authorized representative who has indicated his/her understanding and acceptance.     Dental Advisory Given  Plan Discussed with: CRNA  Anesthesia Plan Comments:        Anesthesia Quick Evaluation

## 2023-02-12 NOTE — H&P (Signed)
@LOGO @   Primary Care Physician:  Junie Spencer, FNP Primary Gastroenterologist:  Dr. Jena Gauss  Pre-Procedure History & Physical: HPI:  [name] is a 72 y.o. female here for  here for surveillance colonoscopy.  History of colonic polyps.  Longstanding GERD;      patient denies esophageal dysphagia to liquids solids and pills.  Past Medical History:  Diagnosis Date   Arthritis    Depression    ONGOING PROBLEM   Diabetes mellitus without complication (HCC)    DX 2008   USES PILLS   Dizziness and giddiness 06/29/2015   GERD (gastroesophageal reflux disease)    Hypertension    Hypothyroidism    Morbidly obese (HCC)    Morton's neuroma of left foot     Past Surgical History:  Procedure Laterality Date   CARPAL TUNNEL RELEASE     LEFT   CARPAL TUNNEL RELEASE Right 12/22/2012   Procedure: CARPAL TUNNEL RELEASE- RIGHT;  Surgeon: Cammy Copa, MD;  Location: [facility] OR;  Service: Orthopedics;  Laterality: Right;   COLONOSCOPY WITH PROPOFOL N/A 01/25/2020   Bexleigh Theriault: 6 polyps removed, tubular adenomas.  Next colonoscopy in 3 years.   POLYPECTOMY  01/25/2020   Procedure: POLYPECTOMY;  Surgeon: Corbin Ade, MD;  Location: [facility]ITE;  Service: Endoscopy;;   SHOULDER ARTHROSCOPY W/ ROTATOR CUFF REPAIR     LEFT   SHOULDER ARTHROSCOPY WITH ROTATOR CUFF REPAIR Right 12/22/2012   Procedure: SHOULDER ARTHROSCOPY WITH ROTATOR CUFF REPAIR AND BICEPS TENODESIS REPAIR;  Surgeon: Cammy Copa, MD;  Location: [facility] OR;  Service: Orthopedics;  Laterality: Right;  Right Carpal Tunnel Release, Right Shoulder Arthroscopy, Rotator Cuff Tear Repair, Biceps Tenodesis   TOE SURGERY     LEFT BIG TOE   TUBAL LIGATION      Prior to Admission medications   Medication Sig Start Date End Date Taking? Authorizing Provider  aspirin EC 81 MG tablet Take 81 mg by mouth daily.   Yes [provider]  atorvastatin (LIPITOR) 10 MG tablet Take 1 tablet (10 mg total) by mouth daily. 02/12/22  Yes  Hawks, Neysa Bonito A, FNP  buPROPion (WELLBUTRIN XL) 300 MG 24 hr tablet Take 1 tablet by mouth once daily 02/03/23  Yes Hawks, Christy A, FNP  escitalopram (LEXAPRO) 20 MG tablet Take 1 tablet (20 mg total) by mouth daily. 02/12/22  Yes Hawks, Neysa Bonito A, FNP  levothyroxine (SYNTHROID) 75 MCG tablet TAKE 1 TABLET BY MOUTH ONCE DAILY BEFORE BREAKFAST 08/28/22  Yes Hawks, Christy A, FNP  lisinopril (ZESTRIL) 10 MG tablet Take 1 tablet (10 mg total) by mouth daily. 02/12/22  Yes Hawks, Christy A, FNP  metFORMIN (GLUCOPHAGE) 1000 MG tablet TAKE 1 TABLET BY MOUTH TWICE DAILY WITH A MEAL 01/31/23  Yes Hawks, Christy A, FNP  nystatin (MYCOSTATIN) 100000 UNIT/ML suspension Take 5 mLs (500,000 Units total) by mouth 4 (four) times daily. 12/03/22  Yes Hawks, Christy A, FNP  ondansetron (ZOFRAN) 4 MG tablet Take 1 tablet (4 mg total) by mouth every 8 (eight) hours as needed for nausea or vomiting. 01/03/23  Yes Hawks, Christy A, FNP  pantoprazole (PROTONIX) 40 MG tablet Take 1 tablet (40 mg total) by mouth daily before breakfast. Discontinue omeprazole 10/25/22  Yes Tiffany Kocher, PA-C  Semaglutide,0.25 or 0.5MG /DOS, 2 MG/3ML SOPN Inject 0.25 mg into the skin once a week. 12/03/22  Yes Hawks, Christy A, FNP  albuterol (VENTOLIN HFA) 108 (90 Base) MCG/ACT inhaler Inhale 2 puffs into the lungs every 6 (  six) hours as needed for wheezing or shortness of breath. 11/01/22   Junie Spencer, FNP  blood glucose meter kit and supplies KIT Dispense based on patient and insurance preference. Use up to four times daily as directed. (FOR ICD-9 250.00, 250.01). 05/29/16   Remus Loffler, PA-C  blood glucose meter kit and supplies Dispense based on patient and insurance preference. Check BS daily Dx E11.9 08/27/22   Jannifer Rodney A, FNP  glucose blood test strip Check BS daily Dx E11.9 08/27/22   Junie Spencer, FNP  Lancets MISC Check BS daily Dx E11.9 08/27/22   Junie Spencer, FNP    Allergies as of 01/07/2023 - Review Complete 01/07/2023   Allergen Reaction Noted   Penicillins Other (See Comments) 12/08/2012   Shrimp [shellfish allergy] Itching 12/08/2012   Sulfa antibiotics Other (See Comments) 12/08/2012    Family History  Problem Relation Age of Onset   Heart attack Mother    Cancer Father        brain   Heart failure Father    Dementia Sister        alzheimers   Heart disease Sister    Diabetes Sister    Hypertension Sister    Hyperlipidemia Sister    Heart failure Sister    Colon cancer Cousin        53   Cancer Brother        Prostate   Leukemia Brother    Breast cancer Neg Hx     Social History   Socioeconomic History   Marital status: Widowed    Spouse name: Not on file   Number of children: 1   Years of education: 54   Highest education level: Not on file  Occupational History   Occupation: disability  Tobacco Use   Smoking status: Never   Smokeless tobacco: Never  Vaping Use   Vaping status: Never Used  Substance and Sexual Activity   Alcohol use: No   Drug use: No   Sexual activity: Not on file  Other Topics Concern   Not on file  Social History Narrative   Patient drinks 1 soda daily.   Patient is left handed.    Daughter lives with her   Social Determinants of Health   Financial Resource Strain: Low Risk  (10/15/2022)   Overall Financial Resource Strain (CARDIA)    Difficulty of Paying Living Expenses: Not hard at all  Food Insecurity: No Food Insecurity (10/15/2022)   Hunger Vital Sign    Worried About Running Out of Food in the Last Year: Never true    Ran Out of Food in the Last Year: Never true  Transportation Needs: No Transportation Needs (10/15/2022)   PRAPARE - Administrator, Civil Service (Medical): No    Lack of Transportation (Non-Medical): No  Physical Activity: Insufficiently Active (10/15/2022)   Exercise Vital Sign    Days of Exercise per Week: 3 days    Minutes of Exercise per Session: 30 min  Stress: No Stress Concern Present (10/15/2022)    [employer] of Occupational Health - Occupational Stress Questionnaire    Feeling of Stress : Not at all  Social Connections: Socially Isolated (10/15/2022)   Social Connection and Isolation Panel [NHANES]    Frequency of Communication with Friends and Family: More than three times a week    Frequency of Social Gatherings with Friends and Family: More than three times a week    Attends Religious Services: Never  Active Member of Clubs or Organizations: No    Attends Banker Meetings: Never    Marital Status: Widowed  Intimate Partner Violence: Not At Risk (10/15/2022)   Humiliation, Afraid, Rape, and Kick questionnaire    Fear of Current or Ex-Partner: No    Emotionally Abused: No    Physically Abused: No    Sexually Abused: No    Review of Systems: See HPI, otherwise negative ROS  Physical Exam: BP (!) 152/63   Pulse 64   Temp 98 F (36.7 C) (Oral)   Resp 16   Ht 4\' 11"  (1.499 m)   Wt 88.3 kg   SpO2 100%   BMI 39.32 kg/m  General:   Alert,  Well-developed, well-nourished, pleasant and cooperative in NAD Lungs:  Clear throughout to auscultation.   No wheezes, crackles, or rhonchi. No acute distress. Heart:  Regular rate and rhythm; no murmurs, clicks, rubs,  or gallops. Abdomen: Non-distended, normal bowel sounds.  Soft and nontender without appreciable mass or hepatosplenomegaly.   Impression/Plan:    72 year old lady with longstanding GERD.    Although dysphagia reported previously she now denies any difficulty swallowing  History of colon polyps removed previously  I have offered the patient a EGD with esophageal dilation is feasible/appropriate per plan as well as a surveillance colonoscopy today.     Notice: This dictation was prepared with Dragon dictation along with smaller phrase technology. Any transcriptional errors that result from this process are unintentional and may not be corrected upon review.

## 2023-02-12 NOTE — Op Note (Signed)
[facility] Patient Name: [name] Procedure Date: 02/12/2023 12:52 PM MRN: [id number] Date of Birth: 12-20-1950 Attending MD: Gennette Pac , MD, [id number] CSN: [id number] Age: 72 Admit Type: Outpatient Procedure:                Colonoscopy Indications:              High risk colon cancer surveillance: Personal                            history of colonic polyps Providers:                Gennette Pac, MD, Crystal Page, Zena Amos Referring MD:              Medicines:                Propofol per Anesthesia Complications:            No immediate complications. Estimated Blood Loss:     Estimated blood loss was minimal. Estimated blood                            loss was minimal. Procedure:                Pre-Anesthesia Assessment:                           - Prior to the procedure, a History and Physical                            was performed, and patient medications and                            allergies were reviewed. The patient's tolerance of                            previous anesthesia was also reviewed. The risks                            and benefits of the procedure and the sedation                            options and risks were discussed with the patient.                            All questions were answered, and informed consent                            was obtained. Prior Anticoagulants: The patient has                            taken no anticoagulant or antiplatelet agents. ASA                            Grade Assessment:  III - A patient with severe                            systemic disease. After reviewing the risks and                            benefits, the patient was deemed in satisfactory                            condition to undergo the procedure.                           After obtaining informed consent, the colonoscope                            was passed under direct vision.  Throughout the                            procedure, the patient's blood pressure, pulse, and                            oxygen saturations were monitored continuously. The                            [id number]) scope was introduced through the                            anus and advanced to the the cecum, identified by                            appendiceal orifice and ileocecal valve. The                            colonoscopy was performed without difficulty. The                            patient tolerated the procedure well. The quality                            of the bowel preparation was adequate. The                            colonoscopy was performed without difficulty. The                            patient tolerated the procedure well. The quality                            of the bowel preparation was adequate. Scope In: 1:16:48 PM Scope Out: 1:41:10 PM Scope Withdrawal Time: 0 hours 12 minutes 34 seconds  Total Procedure Duration: 0 hours 24 minutes 22 seconds  Findings:      A 5 mm polyp was found in the cecum. The polyp was sessile. The polyp       was removed  with a cold snare. Resection and retrieval were complete.       Estimated blood loss was minimal.      Scattered medium-mouthed diverticula were found in the descending colon       and splenic flexure.      The exam was otherwise without abnormality on direct and retroflexion       views. Impression:               - One 5 mm polyp in the cecum, removed with a cold                            snare. Resected and retrieved.                           - Diverticulosis in the descending colon and at the                            splenic flexure.                           - The examination was otherwise normal on direct                            and retroflexion views. Moderate Sedation:      Moderate (conscious) sedation was personally administered by an       anesthesia professional. The following  parameters were monitored: oxygen       saturation, heart rate, blood pressure, respiratory rate, EKG, adequacy       of pulmonary ventilation, and response to care. Recommendation:           - Patient has a contact number available for                            emergencies. The signs and symptoms of potential                            delayed complications were discussed with the                            patient. Return to normal activities tomorrow.                            Written discharge instructions were provided to the                            patient.                           - Advance diet as tolerated.                           - Continue present medications.                           - Repeat colonoscopy date to be determined after  pending pathology results are reviewed for                            surveillance.                           - Return to GI office in 3 months. Procedure Code(s):        --- Professional ---                           [phone], Colonoscopy, flexible; with removal of                            tumor(s), polyp(s), or other lesion(s) by snare                            technique Diagnosis Code(s):        --- Professional ---                           Z86.010, Personal history of colonic polyps                           D12.0, Benign neoplasm of cecum                           K57.30, Diverticulosis of large intestine without                            perforation or abscess without bleeding CPT copyright 2022 American Medical Association. All rights reserved. The codes documented in this report are preliminary and upon coder review may  be revised to meet current compliance requirements. Gerrit Friends. Keiron Iodice, MD Gennette Pac, MD 02/12/2023 1:47:23 PM This report has been signed electronically. Number of Addenda: 0

## 2023-02-12 NOTE — Discharge Instructions (Signed)
EGD Discharge instructions Please read the instructions outlined below and refer to this sheet in the next few weeks. These discharge instructions provide you with general information on caring for yourself after you leave the hospital. Your doctor may also give you specific instructions. While your treatment has been planned according to the most current medical practices available, unavoidable complications occasionally occur. If you have any problems or questions after discharge, please call your doctor. ACTIVITY You may resume your regular activity but move at a slower pace for the next 24 hours.  Take frequent rest periods for the next 24 hours.  Walking will help expel (get rid of) the air and reduce the bloated feeling in your abdomen.  No driving for 24 hours (because of the anesthesia (medicine) used during the test).  You may shower.  Do not sign any important legal documents or operate any machinery for 24 hours (because of the anesthesia used during the test).  NUTRITION Drink plenty of fluids.  You may resume your normal diet.  Begin with a light meal and progress to your normal diet.  Avoid alcoholic beverages for 24 hours or as instructed by your caregiver.  MEDICATIONS You may resume your normal medications unless your caregiver tells you otherwise.  WHAT YOU CAN EXPECT TODAY You may experience abdominal discomfort such as a feeling of fullness or "gas" pains.  FOLLOW-UP Your doctor will discuss the results of your test with you.  SEEK IMMEDIATE MEDICAL ATTENTION IF ANY OF THE FOLLOWING OCCUR: Excessive nausea (feeling sick to your stomach) and/or vomiting.  Severe abdominal pain and distention (swelling).  Trouble swallowing.  Temperature over 101 F (37.8 C).  Rectal bleeding or vomiting of blood.    Colonoscopy Discharge Instructions  Read the instructions outlined below and refer to this sheet in the next few weeks. These discharge instructions provide you with  general information on caring for yourself after you leave the hospital. Your doctor may also give you specific instructions. While your treatment has been planned according to the most current medical practices available, unavoidable complications occasionally occur. If you have any problems or questions after discharge, call Dr. Jena Gauss at [phone]. ACTIVITY You may resume your regular activity, but move at a slower pace for the next 24 hours.  Take frequent rest periods for the next 24 hours.  Walking will help get rid of the air and reduce the bloated feeling in your belly (abdomen).  No driving for 24 hours (because of the medicine (anesthesia) used during the test).   Do not sign any important legal documents or operate any machinery for 24 hours (because of the anesthesia used during the test).  NUTRITION Drink plenty of fluids.  You may resume your normal diet as instructed by your doctor.  Begin with a light meal and progress to your normal diet. Heavy or fried foods are harder to digest and may make you feel sick to your stomach (nauseated).  Avoid alcoholic beverages for 24 hours or as instructed.  MEDICATIONS You may resume your normal medications unless your doctor tells you otherwise.  WHAT YOU CAN EXPECT TODAY Some feelings of bloating in the abdomen.  Passage of more gas than usual.  Spotting of blood in your stool or on the toilet paper.  IF YOU HAD POLYPS REMOVED DURING THE COLONOSCOPY: No aspirin products for 7 days or as instructed.  No alcohol for 7 days or as instructed.  Eat a soft diet for the next 24 hours.  FINDING  OUT THE RESULTS OF YOUR TEST Not all test results are available during your visit. If your test results are not back during the visit, make an appointment with your caregiver to find out the results. Do not assume everything is normal if you have not heard from your caregiver or the medical facility. It is important for you to follow up on all of your test  results.  SEEK IMMEDIATE MEDICAL ATTENTION IF: You have more than a spotting of blood in your stool.  Your belly is swollen (abdominal distention).  You are nauseated or vomiting.  You have a temperature over 101.  You have abdominal pain or discomfort that is severe or gets worse throughout the day.    Your upper GI tract (esophagus etc.  Normal)  1 polyp in your colon removed.  Diverticulosis also found.  Colon polyp and diverticulosis information provided  Further recommendations to follow pending review of pathology report  Office visit with [place] in 3 months  At patient request, called Domingo Dimes at [phone] -reviewed findings and recommendations

## 2023-02-12 NOTE — Op Note (Addendum)
[facility] Patient Name: [name] Procedure Date: 02/12/2023 12:52 PM MRN: [id number] Date of Birth: [birth year] Attending MD: Gennette Pac , MD, [id number] CSN: [id number] Age: 72 Admit Type: Outpatient Procedure:                Upper GI endoscopy Indications:              Dysphagia, Heartburn Providers:                Gennette Pac, MD, Crystal Page, Zena Amos Referring MD:              Medicines:                Propofol per Anesthesia Complications:            No immediate complications. Estimated Blood Loss:     Estimated blood loss was minimal. Procedure:                Pre-Anesthesia Assessment:                           - Prior to the procedure, a History and Physical                            was performed, and patient medications and                            allergies were reviewed. The patient's tolerance of                            previous anesthesia was also reviewed. The risks                            and benefits of the procedure and the sedation                            options and risks were discussed with the patient.                            All questions were answered, and informed consent                            was obtained. Prior Anticoagulants: The patient has                            taken no anticoagulant or antiplatelet agents. ASA                            Grade Assessment: III - A patient with severe                            systemic disease. After reviewing the risks and  benefits, the patient was deemed in satisfactory                            condition to undergo the procedure.                           After obtaining informed consent, the endoscope was                            passed under direct vision. Throughout the                            procedure, the patient's blood pressure, pulse, and                            oxygen saturations  were monitored continuously. The                            [id number]-[id number] ([id number]) scope was introduced through the                            mouth, and advanced to the second part of duodenum.                            The upper GI endoscopy was accomplished without                            difficulty. The patient tolerated the procedure                            well. Scope In: 1:06:46 PM Scope Out: 1:11:33 PM Total Procedure Duration: 0 hours 4 minutes 47 seconds  Findings:      The examined esophagus was normal.      The entire examined stomach was normal (.      The duodenal bulb and second portion of the duodenum were normal. Impression:               - Normal esophagus.                           - Normal stomach.                           - Normal duodenal bulb and second portion of the                            duodenum.                           - No specimens collected. Moderate Sedation:      Moderate (conscious) sedation was personally administered by an       anesthesia professional. The following parameters were monitored: oxygen       saturation, heart rate, blood pressure, respiratory rate, EKG, adequacy       of pulmonary ventilation, and response to care. Recommendation:           -  Patient has a contact number available for                            emergencies. The signs and symptoms of potential                            delayed complications were discussed with the                            patient. Return to normal activities tomorrow.                            Written discharge instructions were provided to the                            patient.                           - Advance diet as tolerated.                           - Continue present medications.                           - Return to my office in 3 months. Procedure Code(s):        --- Professional ---                           [phone], Esophagogastroduodenoscopy, flexible,                             transoral; diagnostic, including collection of                            specimen(s) by brushing or washing, when performed                            (separate procedure) Diagnosis Code(s):        --- Professional ---                           R13.10, Dysphagia, unspecified                           R12, Heartburn CPT copyright 2022 American Medical Association. All rights reserved. The codes documented in this report are preliminary and upon coder review may  be revised to meet current compliance requirements. Gerrit Friends. Abraham Entwistle, MD Gennette Pac, MD 02/12/2023 1:43:07 PM This report has been signed electronically. Number of Addenda: 0

## 2023-02-14 LAB — SURGICAL PATHOLOGY

## 2023-02-15 ENCOUNTER — Encounter: Payer: Self-pay | Admitting: Internal Medicine

## 2023-02-20 ENCOUNTER — Encounter (HOSPITAL_COMMUNITY): Payer: Self-pay | Admitting: Internal Medicine

## 2023-02-20 NOTE — Anesthesia Postprocedure Evaluation (Signed)
Anesthesia Post Note  Patient: [name]  Procedure(s) Performed: COLONOSCOPY WITH PROPOFOL ESOPHAGOGASTRODUODENOSCOPY (EGD) WITH PROPOFOL POLYPECTOMY INTESTINAL  Patient location during evaluation: Phase II Anesthesia Type: General Level of consciousness: awake Pain management: pain level controlled Vital Signs Assessment: post-procedure vital signs reviewed and stable Respiratory status: spontaneous breathing and respiratory function stable Cardiovascular status: blood pressure returned to baseline and stable Postop Assessment: no headache and no apparent nausea or vomiting Anesthetic complications: no Comments: Late entry   No notable events documented.   Last Vitals:  Vitals:   02/12/23 1347 02/12/23 1351  BP: (!) 95/49 (!) 97/48  Pulse:    Resp:    Temp:    SpO2:      Last Pain:  Vitals:   02/13/23 1052  TempSrc:   PainSc: 0-No pain                 Windell Norfolk

## 2023-02-22 ENCOUNTER — Other Ambulatory Visit: Payer: Self-pay | Admitting: Family

## 2023-02-22 DIAGNOSIS — I10 Essential (primary) hypertension: Secondary | ICD-10-CM

## 2023-03-07 ENCOUNTER — Telehealth: Payer: Self-pay

## 2023-03-07 ENCOUNTER — Other Ambulatory Visit: Payer: Self-pay | Admitting: Family

## 2023-03-07 ENCOUNTER — Ambulatory Visit (INDEPENDENT_AMBULATORY_CARE_PROVIDER_SITE_OTHER): Payer: Medicare HMO | Admitting: Family

## 2023-03-07 ENCOUNTER — Encounter: Payer: Self-pay | Admitting: Family

## 2023-03-07 VITALS — BP 117/72 | HR 73 | Temp 97.3°F | Ht 59.0 in | Wt 189.6 lb

## 2023-03-07 DIAGNOSIS — K219 Gastro-esophageal reflux disease without esophagitis: Secondary | ICD-10-CM

## 2023-03-07 DIAGNOSIS — E039 Hypothyroidism, unspecified: Secondary | ICD-10-CM | POA: Diagnosis not present

## 2023-03-07 DIAGNOSIS — I10 Essential (primary) hypertension: Secondary | ICD-10-CM

## 2023-03-07 DIAGNOSIS — E785 Hyperlipidemia, unspecified: Secondary | ICD-10-CM

## 2023-03-07 DIAGNOSIS — M150 Primary generalized (osteo)arthritis: Secondary | ICD-10-CM

## 2023-03-07 DIAGNOSIS — F3341 Major depressive disorder, recurrent, in partial remission: Secondary | ICD-10-CM

## 2023-03-07 DIAGNOSIS — E1169 Type 2 diabetes mellitus with other specified complication: Secondary | ICD-10-CM | POA: Diagnosis not present

## 2023-03-07 LAB — BAYER DCA HB A1C WAIVED: HB A1C (BAYER DCA - WAIVED): 6.5 % — ABNORMAL HIGH (ref 4.8–5.6)

## 2023-03-07 NOTE — Patient Instructions (Addendum)

## 2023-03-07 NOTE — Telephone Encounter (Signed)
Called pt to see if she could come in earlier for her appt. Pt will call back to let us know. Okay for pt to come in anytime after 1:50.

## 2023-03-07 NOTE — Progress Notes (Signed)
Subjective:    Patient ID: [name], female    DOB: 03-24-1951, 72 y.o.   MRN: [id number]  Chief Complaint  Patient presents with   Medical Management of Chronic Issues    2 month    Pt presents to the office today for chronic follow up. She is taking Ozempic for diabetes and weight loss, however, the 1 mg dose was making her sick. We decreased to 0.25 mg.   She is morbid obese with a BMI of 38 and HTN and DM.     03/07/2023    1:10 PM 02/12/2023   10:38 AM 02/07/2023    2:59 PM  Last 3 Weights  Weight (lbs) 189 lb 9.6 oz 194 lb 10.7 oz 194 lb 10.7 oz  Weight (kg) 86.002 kg 88.3 kg 88.3 kg     Hypertension This is a chronic problem. The current episode started more than 1 year ago. The problem has been resolved since onset. The problem is controlled. Pertinent negatives include no blurred vision, malaise/fatigue, peripheral edema or shortness of breath. Risk factors for coronary artery disease include dyslipidemia, obesity and sedentary lifestyle. The current treatment provides moderate improvement. There is no history of heart failure. Identifiable causes of hypertension include a thyroid problem.  Gastroesophageal Reflux She complains of belching and heartburn. She reports no hoarse voice. This is a chronic problem. The current episode started more than 1 year ago. The problem occurs occasionally. Pertinent negatives include no fatigue. Risk factors include obesity. She has tried a PPI for the symptoms. The treatment provided moderate relief.  Thyroid Problem Presents for follow-up visit. Patient reports no constipation, diarrhea, fatigue or hoarse voice. The symptoms have been stable. Her past medical history is significant for hyperlipidemia. There is no history of heart failure.  Hyperlipidemia This is a chronic problem. The current episode started more than 1 year ago. The problem is controlled. Recent lipid tests were reviewed and are normal. Exacerbating diseases include  obesity. Pertinent negatives include no shortness of breath. Current antihyperlipidemic treatment includes statins. The current treatment provides moderate improvement of lipids. Risk factors for coronary artery disease include dyslipidemia, hypertension, a sedentary lifestyle, post-menopausal and diabetes mellitus.  Diabetes She presents for her follow-up diabetic visit. She has type 2 diabetes mellitus. Pertinent negatives for diabetes include no blurred vision and no fatigue. Symptoms are stable. Risk factors for coronary artery disease include dyslipidemia, diabetes mellitus, hypertension, sedentary lifestyle and post-menopausal. She is following a generally healthy diet. Her overall blood glucose range is 110-130 mg/dl. Eye exam is not current.  Arthritis Presents for follow-up visit. She complains of pain and stiffness. Affected locations include the left MCP, left knee, right knee and right MCP. Her pain is at a severity of 3/10. Pertinent negatives include no diarrhea or fatigue.  Depression        This is a chronic problem.  The current episode started more than 1 year ago.   The problem occurs intermittently.  Associated symptoms include sad.  Associated symptoms include no fatigue, no helplessness and no hopelessness.  Past medical history includes thyroid problem.       Review of Systems  Constitutional:  Negative for fatigue and malaise/fatigue.  HENT:  Negative for hoarse voice.   Eyes:  Negative for blurred vision.  Respiratory:  Negative for shortness of breath.   Gastrointestinal:  Positive for heartburn. Negative for constipation and diarrhea.  Musculoskeletal:  Positive for arthritis and stiffness.  Psychiatric/Behavioral:  Positive for depression.  All other systems reviewed and are negative.      Objective:   Physical Exam Vitals reviewed.  Constitutional:      General: She is not in acute distress.    Appearance: She is well-developed. She is obese.  HENT:      Head: Normocephalic and atraumatic.     Right Ear: Tympanic membrane normal.     Left Ear: Tympanic membrane normal.  Eyes:     Pupils: Pupils are equal, round, and reactive to light.  Neck:     Thyroid: No thyromegaly.  Cardiovascular:     Rate and Rhythm: Normal rate and regular rhythm.     Heart sounds: Normal heart sounds. No murmur heard. Pulmonary:     Effort: Pulmonary effort is normal. No respiratory distress.     Breath sounds: Normal breath sounds. No wheezing.  Abdominal:     General: Bowel sounds are normal. There is no distension.     Palpations: Abdomen is soft.     Tenderness: There is no abdominal tenderness.  Musculoskeletal:        General: No tenderness. Normal range of motion.     Cervical back: Normal range of motion and neck supple.  Skin:    General: Skin is warm and dry.  Neurological:     Mental Status: She is alert and oriented to person, place, and time.     Cranial Nerves: No cranial nerve deficit.     Deep Tendon Reflexes: Reflexes are normal and symmetric.  Psychiatric:        Behavior: Behavior normal.        Thought Content: Thought content normal.        Judgment: Judgment normal.          BP 117/72   Pulse 73   Temp (!) 97.3 F (36.3 C) (Temporal)   Ht 4\' 11"  (1.499 m)   Wt 189 lb 9.6 oz (86 kg)   SpO2 98%   BMI 38.29 kg/m   Assessment & Plan:  [name] comes in today with chief complaint of Medical Management of Chronic Issues (2 month )   Diagnosis and orders addressed:  1. Type 2 diabetes mellitus with other specified complication, without long-term current use of insulin (HCC) Continue Ozempic 0.25 mg at this time since doing well and no nausea or vomiting - Bayer DCA Hb A1c Waived - CMP14+EGFR - Microalbumin / creatinine urine ratio  2. Essential hypertension - CMP14+EGFR  3. Gastroesophageal reflux disease, unspecified whether esophagitis present - CMP14+EGFR  4. Hyperlipidemia associated with type 2  diabetes mellitus (HCC) - CMP14+EGFR  5. Hypothyroidism, unspecified type - CMP14+EGFR  6. Morbid obesity (HCC) - CMP14+EGFR  7. Primary osteoarthritis involving multiple joints - CMP14+EGFR  8. Recurrent major depressive disorder, in partial remission (HCC) - CMP14+EGFR   Labs pending Continue current medications  Health Maintenance reviewed Diet and exercise encouraged  Follow up plan: 3 months    Jannifer Rodney, FNP

## 2023-03-08 LAB — CMP14+EGFR
ALT: 14 [IU]/L (ref 0–32)
AST: 16 [IU]/L (ref 0–40)
Albumin: 4.2 g/dL (ref 3.9–4.9)
Alkaline Phosphatase: 55 [IU]/L (ref 44–121)
BUN/Creatinine Ratio: 18 (ref 12–28)
BUN: 15 mg/dL (ref 8–27)
Bilirubin Total: 0.2 mg/dL (ref 0.0–1.2)
CO2: 22 mmol/L (ref 20–29)
Calcium: 9.5 mg/dL (ref 8.7–10.3)
Chloride: 101 mmol/L (ref 96–106)
Creatinine, Ser: 0.84 mg/dL (ref 0.57–1.00)
Globulin, Total: 2.1 g/dL (ref 1.5–4.5)
Glucose: 136 mg/dL — ABNORMAL HIGH (ref 70–99)
Potassium: 5.4 mmol/L — ABNORMAL HIGH (ref 3.5–5.2)
Sodium: 142 mmol/L (ref 134–144)
Total Protein: 6.3 g/dL (ref 6.0–8.5)
eGFR: 75 mL/min/{1.73_m2} (ref >59)

## 2023-03-08 LAB — MICROALBUMIN / CREATININE URINE RATIO
Creatinine, Urine: 164.4 mg/dL
Microalb/Creat Ratio: 8 mg/g{creat} (ref 0–29)
Microalbumin, Urine: 13.6 ug/mL

## 2023-03-17 ENCOUNTER — Telehealth: Payer: Self-pay | Admitting: Gastroenterology

## 2023-03-17 DIAGNOSIS — D649 Anemia, unspecified: Secondary | ICD-10-CM

## 2023-03-17 NOTE — Telephone Encounter (Signed)
Patient with mild normocytic anemia. Due for follow up labs at her convenience. CBC, iron/tibc/ferritin/b12/folate.

## 2023-03-18 NOTE — Telephone Encounter (Signed)
Called pt. She is aware lab orders placed for [facility]. She also stated she didn't know she had normocytic anemia that this was the 1st she was hearing of this. Aware will call once results available. FYI

## 2023-03-18 NOTE — Addendum Note (Signed)
Addended by: Armstead Peaks on: 03/18/2023 04:33 PM   Modules accepted: Orders

## 2023-03-19 DIAGNOSIS — M1990 Unspecified osteoarthritis, unspecified site: Secondary | ICD-10-CM | POA: Diagnosis not present

## 2023-03-19 DIAGNOSIS — I2510 Atherosclerotic heart disease of native coronary artery without angina pectoris: Secondary | ICD-10-CM | POA: Diagnosis not present

## 2023-03-19 DIAGNOSIS — J449 Chronic obstructive pulmonary disease, unspecified: Secondary | ICD-10-CM | POA: Diagnosis not present

## 2023-03-19 DIAGNOSIS — K219 Gastro-esophageal reflux disease without esophagitis: Secondary | ICD-10-CM | POA: Diagnosis not present

## 2023-03-19 DIAGNOSIS — N182 Chronic kidney disease, stage 2 (mild): Secondary | ICD-10-CM | POA: Diagnosis not present

## 2023-03-19 DIAGNOSIS — E1122 Type 2 diabetes mellitus with diabetic chronic kidney disease: Secondary | ICD-10-CM | POA: Diagnosis not present

## 2023-03-19 DIAGNOSIS — Z008 Encounter for other general examination: Secondary | ICD-10-CM | POA: Diagnosis not present

## 2023-03-19 DIAGNOSIS — I129 Hypertensive chronic kidney disease with stage 1 through stage 4 chronic kidney disease, or unspecified chronic kidney disease: Secondary | ICD-10-CM | POA: Diagnosis not present

## 2023-03-19 DIAGNOSIS — E039 Hypothyroidism, unspecified: Secondary | ICD-10-CM | POA: Diagnosis not present

## 2023-03-19 DIAGNOSIS — F411 Generalized anxiety disorder: Secondary | ICD-10-CM | POA: Diagnosis not present

## 2023-03-19 DIAGNOSIS — F325 Major depressive disorder, single episode, in full remission: Secondary | ICD-10-CM | POA: Diagnosis not present

## 2023-03-19 DIAGNOSIS — E785 Hyperlipidemia, unspecified: Secondary | ICD-10-CM | POA: Diagnosis not present

## 2023-03-19 LAB — LAB REPORT - SCANNED: Microalb Creat Ratio: <30

## 2023-04-05 ENCOUNTER — Other Ambulatory Visit: Payer: Self-pay | Admitting: Family

## 2023-04-05 DIAGNOSIS — F3341 Major depressive disorder, recurrent, in partial remission: Secondary | ICD-10-CM

## 2023-04-14 ENCOUNTER — Encounter: Payer: Self-pay | Admitting: Internal Medicine

## 2023-05-08 ENCOUNTER — Other Ambulatory Visit: Payer: Self-pay | Admitting: Family

## 2023-05-08 DIAGNOSIS — F3341 Major depressive disorder, recurrent, in partial remission: Secondary | ICD-10-CM

## 2023-05-09 ENCOUNTER — Ambulatory Visit: Payer: Medicare HMO | Admitting: Internal Medicine

## 2023-05-09 VITALS — BP 105/67 | HR 73 | Temp 97.2°F | Ht 59.0 in | Wt 186.0 lb

## 2023-05-09 DIAGNOSIS — R112 Nausea with vomiting, unspecified: Secondary | ICD-10-CM

## 2023-05-09 DIAGNOSIS — K219 Gastro-esophageal reflux disease without esophagitis: Secondary | ICD-10-CM

## 2023-05-09 DIAGNOSIS — R110 Nausea: Secondary | ICD-10-CM

## 2023-05-09 DIAGNOSIS — Z860101 Personal history of adenomatous and serrated colon polyps: Secondary | ICD-10-CM | POA: Diagnosis not present

## 2023-05-09 NOTE — Progress Notes (Unsigned)
Primary Care Physician:  Junie Spencer, FNP Primary Gastroenterologist:  Dr. Jena Gauss-  Pre-Procedure History & Physical: HPI:  [name] is a 72 y.o. female here for  Follow-up reflux/ nausea.  Benign polyp removed earlier this year; due for 1 more colonoscopy for surveillance 7 years of overall health permits.  No more dysphagia.  Reflux well-controlled on Protonix.  She has lost about 35 pounds on Ozempic.  Big issue is gas and intermittent nausea and vomiting.  She is trying to eat regular portions.  Past Medical History:  Diagnosis Date   Arthritis    Depression    ONGOING PROBLEM   Diabetes mellitus without complication (HCC)    DX 2008   USES PILLS   Dizziness and giddiness 06/29/2015   GERD (gastroesophageal reflux disease)    Hypertension    Hypothyroidism    Morbidly obese (HCC)    Morton's neuroma of left foot     Past Surgical History:  Procedure Laterality Date   CARPAL TUNNEL RELEASE     LEFT   CARPAL TUNNEL RELEASE Right 12/22/2012   Procedure: CARPAL TUNNEL RELEASE- RIGHT;  Surgeon: Cammy Copa, MD;  Location: [facility] OR;  Service: Orthopedics;  Laterality: Right;   COLONOSCOPY WITH PROPOFOL N/A 01/25/2020   Harlan Ervine: 6 polyps removed, tubular adenomas.  Next colonoscopy in 3 years.   COLONOSCOPY WITH PROPOFOL N/A 02/12/2023   Procedure: COLONOSCOPY WITH PROPOFOL;  Surgeon: Corbin Ade, MD;  Location: [facility];  Service: Endoscopy;  Laterality: N/A;  11:15 am, asa 3   ESOPHAGOGASTRODUODENOSCOPY (EGD) WITH PROPOFOL N/A 02/12/2023   Procedure: ESOPHAGOGASTRODUODENOSCOPY (EGD) WITH PROPOFOL;  Surgeon: Corbin Ade, MD;  Location: [facility];  Service: Endoscopy;  Laterality: N/A;   POLYPECTOMY  01/25/2020   Procedure: POLYPECTOMY;  Surgeon: Corbin Ade, MD;  Location: [facility];  Service: Endoscopy;;   POLYPECTOMY  02/12/2023   Procedure: POLYPECTOMY INTESTINAL;  Surgeon: Corbin Ade, MD;  Location: [facility];  Service: Endoscopy;;    SHOULDER ARTHROSCOPY W/ ROTATOR CUFF REPAIR     LEFT   SHOULDER ARTHROSCOPY WITH ROTATOR CUFF REPAIR Right 12/22/2012   Procedure: SHOULDER ARTHROSCOPY WITH ROTATOR CUFF REPAIR AND BICEPS TENODESIS REPAIR;  Surgeon: Cammy Copa, MD;  Location: [facility] OR;  Service: Orthopedics;  Laterality: Right;  Right Carpal Tunnel Release, Right Shoulder Arthroscopy, Rotator Cuff Tear Repair, Biceps Tenodesis   TOE SURGERY     LEFT BIG TOE   TUBAL LIGATION      Prior to Admission medications   Medication Sig Start Date End Date Taking? Authorizing Provider  albuterol (VENTOLIN HFA) 108 (90 Base) MCG/ACT inhaler Inhale 2 puffs into the lungs every 6 (six) hours as needed for wheezing or shortness of breath. 11/01/22  Yes Hawks, Christy A, FNP  aspirin EC 81 MG tablet Take 81 mg by mouth daily.   Yes [provider]  atorvastatin (LIPITOR) 10 MG tablet Take 1 tablet (10 mg total) by mouth daily. 02/12/22  Yes Hawks, Christy A, FNP  blood glucose meter kit and supplies KIT Dispense based on patient and insurance preference. Use up to four times daily as directed. (FOR ICD-9 250.00, 250.01). 05/29/16  Yes Remus Loffler, PA-C  blood glucose meter kit and supplies Dispense based on patient and insurance preference. Check BS daily Dx E11.9 08/27/22  Yes Jannifer Rodney A, FNP  buPROPion (WELLBUTRIN XL) 300 MG 24 hr tablet Take 1 tablet by mouth once daily 04/07/23  Yes Junie Spencer, FNP  escitalopram (LEXAPRO) 20 MG tablet Take 1 tablet by mouth once daily 05/08/23  Yes Hawks, Wagener A, FNP  glucose blood test strip Check BS daily Dx E11.9 08/27/22  Yes Junie Spencer, FNP  Lancets MISC Check BS daily Dx E11.9 08/27/22  Yes Jannifer Rodney A, FNP  levothyroxine (SYNTHROID) 75 MCG tablet TAKE 1 TABLET BY MOUTH ONCE DAILY BEFORE BREAKFAST 03/10/23  Yes Hawks, Christy A, FNP  lisinopril (ZESTRIL) 10 MG tablet Take 1 tablet by mouth once daily 02/24/23  Yes Hawks, Christy A, FNP  metFORMIN (GLUCOPHAGE) 1000 MG  tablet TAKE 1 TABLET BY MOUTH TWICE DAILY WITH A MEAL 03/10/23  Yes Hawks, Christy A, FNP  ondansetron (ZOFRAN) 4 MG tablet Take 1 tablet (4 mg total) by mouth every 8 (eight) hours as needed for nausea or vomiting. 01/03/23  Yes Hawks, Christy A, FNP  pantoprazole (PROTONIX) 40 MG tablet Take 1 tablet (40 mg total) by mouth daily before breakfast. Discontinue omeprazole 10/25/22  Yes Tiffany Kocher, PA-C  Semaglutide,0.25 or 0.5MG /DOS, 2 MG/3ML SOPN Inject 0.25 mg into the skin once a week. 12/03/22  Yes Hawks, Christy A, FNP  nystatin (MYCOSTATIN) 100000 UNIT/ML suspension Take 5 mLs (500,000 Units total) by mouth 4 (four) times daily. Patient not taking: Reported on 05/09/2023 12/03/22   Junie Spencer, FNP    Allergies as of 05/09/2023 - Review Complete 05/09/2023  Allergen Reaction Noted   Penicillins Other (See Comments) 12/08/2012   Shrimp [shellfish allergy] Itching 12/08/2012   Sulfa antibiotics Other (See Comments) 12/08/2012    Family History  Problem Relation Age of Onset   Heart attack Mother    Cancer Father        brain   Heart failure Father    Dementia Sister        alzheimers   Heart disease Sister    Diabetes Sister    Hypertension Sister    Hyperlipidemia Sister    Heart failure Sister    Colon cancer Cousin        89   Cancer Brother        Prostate   Leukemia Brother    Breast cancer Neg Hx     Social History   Socioeconomic History   Marital status: Widowed    Spouse name: Not on file   Number of children: 1   Years of education: 57   Highest education level: Not on file  Occupational History   Occupation: disability  Tobacco Use   Smoking status: Never   Smokeless tobacco: Never  Vaping Use   Vaping status: Never Used  Substance and Sexual Activity   Alcohol use: No   Drug use: No   Sexual activity: Not on file  Other Topics Concern   Not on file  Social History Narrative   Patient drinks 1 soda daily.   Patient is left handed.     Daughter lives with her   Social Drivers of Health   Financial Resource Strain: Low Risk  (10/15/2022)   Overall Financial Resource Strain (CARDIA)    Difficulty of Paying Living Expenses: Not hard at all  Food Insecurity: No Food Insecurity (10/15/2022)   Hunger Vital Sign    Worried About Running Out of Food in the Last Year: Never true    Ran Out of Food in the Last Year: Never true  Transportation Needs: No Transportation Needs (10/15/2022)   PRAPARE - Transportation    Lack of  Transportation (Medical): No    Lack of Transportation (Non-Medical): No  Physical Activity: Insufficiently Active (10/15/2022)   Exercise Vital Sign    Days of Exercise per Week: 3 days    Minutes of Exercise per Session: 30 min  Stress: No Stress Concern Present (10/15/2022)   [employer] of Occupational Health - Occupational Stress Questionnaire    Feeling of Stress : Not at all  Social Connections: Socially Isolated (10/15/2022)   Social Connection and Isolation Panel [NHANES]    Frequency of Communication with Friends and Family: More than three times a week    Frequency of Social Gatherings with Friends and Family: More than three times a week    Attends Religious Services: Never    Database administrator or Organizations: No    Attends Banker Meetings: Never    Marital Status: Widowed  Intimate Partner Violence: Not At Risk (10/15/2022)   Humiliation, Afraid, Rape, and Kick questionnaire    Fear of Current or Ex-Partner: No    Emotionally Abused: No    Physically Abused: No    Sexually Abused: No    Review of Systems: See HPI, otherwise negative ROS  Physical Exam: BP 105/67 (BP Location: Right Arm, Patient Position: Sitting, Cuff Size: Normal)   Pulse 73   Temp (!) 97.2 F (36.2 C) (Temporal)   Ht 4\' 11"  (1.499 m)   Wt 186 lb (84.4 kg)   BMI 37.57 kg/m  General:   Alert,  Well-developed, well-nourished, pleasant and cooperative in NAD Abdomen: Nondistended.  Soft and  nontender.  No succussion splash.    Impression/Plan: 72 year old lady with GERD now well-controlled on Protonix 40 mg daily  Intermittent nausea on Ozempic.  She is likely experiencing a side effect of GLP-1 modulator.  I explained to patient that this medication slows Gastric emptying's.    She would likely benefit from smaller feedings throughout the day.  History of colonic adenoma; negative colonoscopy earlier this year.  Recommendations:  Continue Protonix 40 mg daily-best taken 30 minutes before office.  Gastroparesis diet  Given history of polyps, plan for 1 more colonoscopy in 7 years if overall health permits  Office visit here in 6 months      Notice: This dictation was prepared with Dragon dictation along with smaller phrase technology. Any transcriptional errors that result from this process are unintentional and may not be corrected upon review.

## 2023-05-09 NOTE — Patient Instructions (Addendum)
It was good to see you again today!  Continue Protonix 40 mg daily-best taken 30 minutes before office.  Ozempic slows your stomach down.  If you put too much in your stomach you get nauseated.  Cannot eat the same way you did without Ozempic.  Make the nausea better just divide up your meals into smaller snacks throughout the day perhaps 5 or 6 instead of 3 typical meals.  The key is not to put too much in your stomach at 1 time.  Given history of polyps, plan for 1 more colonoscopy in 7 years if your overall health permits  Office visit here in 6 months

## 2023-05-27 NOTE — Telephone Encounter (Signed)
 Received notification from [employer] regarding approval for OZEMPIC. Patient assistance approved until 05/27/23.  Medication will ship to [facility]  Company phone: [phone]

## 2023-06-09 ENCOUNTER — Ambulatory Visit: Payer: Medicare HMO | Admitting: Family

## 2023-06-19 ENCOUNTER — Other Ambulatory Visit: Payer: Self-pay | Admitting: Family

## 2023-06-19 DIAGNOSIS — E1169 Type 2 diabetes mellitus with other specified complication: Secondary | ICD-10-CM

## 2023-07-21 ENCOUNTER — Other Ambulatory Visit: Payer: Self-pay | Admitting: Family

## 2023-07-21 DIAGNOSIS — E1169 Type 2 diabetes mellitus with other specified complication: Secondary | ICD-10-CM

## 2023-07-22 MED ORDER — ATORVASTATIN CALCIUM 10 MG PO TABS: 10.0000 mg | ORAL_TABLET | Freq: Every day | ORAL | 0 refills | Status: DC

## 2023-07-22 MED ORDER — METFORMIN HCL 1000 MG PO TABS: 1000.0000 mg | ORAL_TABLET | Freq: Two times a day (BID) | ORAL | 0 refills | Status: DC

## 2023-07-22 NOTE — Telephone Encounter (Signed)
 Made appt for feb 28

## 2023-07-22 NOTE — Telephone Encounter (Signed)
 Christy pt NTBS 30-d given 06/20/23

## 2023-07-22 NOTE — Addendum Note (Signed)
 Addended by: Julious Payer D on: 07/22/2023 12:02 PM   Modules accepted: Orders

## 2023-07-25 ENCOUNTER — Ambulatory Visit (INDEPENDENT_AMBULATORY_CARE_PROVIDER_SITE_OTHER): Payer: Medicare HMO | Admitting: Family

## 2023-07-25 ENCOUNTER — Encounter: Payer: Self-pay | Admitting: Family

## 2023-07-25 VITALS — BP 125/78 | HR 60 | Temp 97.8°F | Ht 59.0 in | Wt 180.6 lb

## 2023-07-25 DIAGNOSIS — F3341 Major depressive disorder, recurrent, in partial remission: Secondary | ICD-10-CM

## 2023-07-25 DIAGNOSIS — I10 Essential (primary) hypertension: Secondary | ICD-10-CM | POA: Diagnosis not present

## 2023-07-25 DIAGNOSIS — E785 Hyperlipidemia, unspecified: Secondary | ICD-10-CM | POA: Diagnosis not present

## 2023-07-25 DIAGNOSIS — E039 Hypothyroidism, unspecified: Secondary | ICD-10-CM | POA: Diagnosis not present

## 2023-07-25 DIAGNOSIS — M150 Primary generalized (osteo)arthritis: Secondary | ICD-10-CM | POA: Diagnosis not present

## 2023-07-25 DIAGNOSIS — E1169 Type 2 diabetes mellitus with other specified complication: Secondary | ICD-10-CM | POA: Diagnosis not present

## 2023-07-25 DIAGNOSIS — R112 Nausea with vomiting, unspecified: Secondary | ICD-10-CM | POA: Diagnosis not present

## 2023-07-25 DIAGNOSIS — Z0000 Encounter for general adult medical examination without abnormal findings: Secondary | ICD-10-CM

## 2023-07-25 DIAGNOSIS — Z7985 Long-term (current) use of injectable non-insulin antidiabetic drugs: Secondary | ICD-10-CM | POA: Diagnosis not present

## 2023-07-25 DIAGNOSIS — Z0001 Encounter for general adult medical examination with abnormal findings: Secondary | ICD-10-CM

## 2023-07-25 DIAGNOSIS — Z6836 Body mass index (BMI) 36.0-36.9, adult: Secondary | ICD-10-CM

## 2023-07-25 DIAGNOSIS — K219 Gastro-esophageal reflux disease without esophagitis: Secondary | ICD-10-CM

## 2023-07-25 LAB — BAYER DCA HB A1C WAIVED: HB A1C (BAYER DCA - WAIVED): 6.1 % — ABNORMAL HIGH (ref 4.8–5.6)

## 2023-07-25 MED ORDER — ESCITALOPRAM OXALATE 20 MG PO TABS: 20.0000 mg | ORAL_TABLET | Freq: Every day | ORAL | 2 refills | Status: DC

## 2023-07-25 MED ORDER — SEMAGLUTIDE(0.25 OR 0.5MG/DOS) 2 MG/3ML ~~LOC~~ SOPN: 0.5000 mg | PEN_INJECTOR | SUBCUTANEOUS | 1 refills | Status: DC

## 2023-07-25 MED ORDER — BUPROPION HCL ER (XL) 300 MG PO TB24: 300.0000 mg | ORAL_TABLET | Freq: Every day | ORAL | 3 refills | Status: AC

## 2023-07-25 MED ORDER — LISINOPRIL 10 MG PO TABS: 10.0000 mg | ORAL_TABLET | Freq: Every day | ORAL | 0 refills | Status: DC

## 2023-07-25 MED ORDER — ATORVASTATIN CALCIUM 10 MG PO TABS: 10.0000 mg | ORAL_TABLET | Freq: Every day | ORAL | 3 refills | Status: AC

## 2023-07-25 MED ORDER — ONDANSETRON HCL 4 MG PO TABS: 4.0000 mg | ORAL_TABLET | Freq: Three times a day (TID) | ORAL | 0 refills | Status: AC | PRN

## 2023-07-25 MED ORDER — METFORMIN HCL 1000 MG PO TABS: 1000.0000 mg | ORAL_TABLET | Freq: Two times a day (BID) | ORAL | 1 refills | Status: DC

## 2023-07-25 NOTE — Patient Instructions (Signed)
 Health Maintenance After Age 71 After age 4, you are at a higher risk for certain long-term diseases and infections as well as injuries from falls. Falls are a major cause of broken bones and head injuries in people who are older than age 47. Getting regular preventive care can help to keep you healthy and well. Preventive care includes getting regular testing and making lifestyle changes as recommended by your health care provider. Talk with your health care provider about: Which screenings and tests you should have. A screening is a test that checks for a disease when you have no symptoms. A diet and exercise plan that is right for you. What should I know about screenings and tests to prevent falls? Screening and testing are the best ways to find a health problem early. Early diagnosis and treatment give you the best chance of managing medical conditions that are common after age 37. Certain conditions and lifestyle choices may make you more likely to have a fall. Your health care provider may recommend: Regular vision checks. Poor vision and conditions such as cataracts can make you more likely to have a fall. If you wear glasses, make sure to get your prescription updated if your vision changes. Medicine review. Work with your health care provider to regularly review all of the medicines you are taking, including over-the-counter medicines. Ask your health care provider about any side effects that may make you more likely to have a fall. Tell your health care provider if any medicines that you take make you feel dizzy or sleepy. Strength and balance checks. Your health care provider may recommend certain tests to check your strength and balance while standing, walking, or changing positions. Foot health exam. Foot pain and numbness, as well as not wearing proper footwear, can make you more likely to have a fall. Screenings, including: Osteoporosis screening. Osteoporosis is a condition that causes  the bones to get weaker and break more easily. Blood pressure screening. Blood pressure changes and medicines to control blood pressure can make you feel dizzy. Depression screening. You may be more likely to have a fall if you have a fear of falling, feel depressed, or feel unable to do activities that you used to do. Alcohol use screening. Using too much alcohol can affect your balance and may make you more likely to have a fall. Follow these instructions at home: Lifestyle Do not drink alcohol if: Your health care provider tells you not to drink. If you drink alcohol: Limit how much you have to: 0-1 drink a day for women. 0-2 drinks a day for men. Know how much alcohol is in your drink. In the [place] one drink equals one 12 oz bottle of beer (355 mL), one 5 oz glass of wine (148 mL), or one 1 oz glass of hard liquor (44 mL). Do not use any products that contain nicotine or tobacco. These products include cigarettes, chewing tobacco, and vaping devices, such as e-cigarettes. If you need help quitting, ask your health care provider. Activity  Follow a regular exercise program to stay fit. This will help you maintain your balance. Ask your health care provider what types of exercise are appropriate for you. If you need a cane or walker, use it as recommended by your health care provider. Wear supportive shoes that have nonskid soles. Safety  Remove any tripping hazards, such as rugs, cords, and clutter. Install safety equipment such as grab bars in bathrooms and safety rails on stairs. Keep rooms and walkways  well-lit. General instructions Talk with your health care provider about your risks for falling. Tell your health care provider if: You fall. Be sure to tell your health care provider about all falls, even ones that seem minor. You feel dizzy, tiredness (fatigue), or off-balance. Take over-the-counter and prescription medicines only as told by your health care provider. These include  supplements. Eat a healthy diet and maintain a healthy weight. A healthy diet includes low-fat dairy products, low-fat (lean) meats, and fiber from whole grains, beans, and lots of fruits and vegetables. Stay current with your vaccines. Schedule regular health, dental, and eye exams. Summary Having a healthy lifestyle and getting preventive care can help to protect your health and wellness after age 11. Screening and testing are the best way to find a health problem early and help you avoid having a fall. Early diagnosis and treatment give you the best chance for managing medical conditions that are more common for people who are older than age 28. Falls are a major cause of broken bones and head injuries in people who are older than age 48. Take precautions to prevent a fall at home. Work with your health care provider to learn what changes you can make to improve your health and wellness and to prevent falls. This information is not intended to replace advice given to you by your health care provider. Make sure you discuss any questions you have with your health care provider. Document Revised: 10/02/2020 Document Reviewed: 10/02/2020 Elsevier Patient Education  2024 [employer].

## 2023-07-25 NOTE — Progress Notes (Signed)
 Subjective:    Patient ID: [name], female    DOB: 1951/01/15, 73 y.o.   MRN: [id number]  Chief Complaint  Patient presents with   Medical Management of Chronic Issues   Pt presents to the office today for CPE and chronic follow up. She is taking Ozempic for diabetes and weight loss, however, the 1 mg dose was making her sick. We decreased to 0.5 mg. She has lost 23 lbs.   She is morbid obese with a BMI of 36 and HTN and DM.     07/25/2023    9:15 AM 05/09/2023    9:51 AM 03/07/2023    1:10 PM  Last 3 Weights  Weight (lbs) 180 lb 9.6 oz 186 lb 189 lb 9.6 oz  Weight (kg) 81.92 kg 84.369 kg 86.002 kg     Hypertension This is a chronic problem. The current episode started more than 1 year ago. The problem has been resolved since onset. The problem is controlled. Pertinent negatives include no blurred vision, malaise/fatigue, peripheral edema or shortness of breath. Risk factors for coronary artery disease include dyslipidemia, obesity and sedentary lifestyle. The current treatment provides moderate improvement. There is no history of heart failure. Identifiable causes of hypertension include a thyroid problem.  Gastroesophageal Reflux She complains of belching and heartburn. She reports no hoarse voice. This is a chronic problem. The current episode started more than 1 year ago. The problem occurs occasionally. Associated symptoms include fatigue. Risk factors include obesity. She has tried a PPI for the symptoms. The treatment provided moderate relief.  Thyroid Problem Presents for follow-up visit. Symptoms include diarrhea, dry skin and fatigue. Patient reports no constipation or hoarse voice. The symptoms have been stable. There is no history of heart failure.  Hyperlipidemia This is a chronic problem. The current episode started more than 1 year ago. The problem is controlled. Recent lipid tests were reviewed and are normal. Exacerbating diseases include obesity. Pertinent  negatives include no shortness of breath. Current antihyperlipidemic treatment includes statins. The current treatment provides moderate improvement of lipids. Risk factors for coronary artery disease include dyslipidemia, hypertension, a sedentary lifestyle, post-menopausal and diabetes mellitus.  Diabetes She presents for her follow-up diabetic visit. She has type 2 diabetes mellitus. Associated symptoms include fatigue. Pertinent negatives for diabetes include no blurred vision. Symptoms are stable. Risk factors for coronary artery disease include dyslipidemia, diabetes mellitus, hypertension, sedentary lifestyle and post-menopausal. She is following a generally healthy diet. Her overall blood glucose range is 90-110 mg/dl. Eye exam is not current.  Arthritis Presents for follow-up visit. She complains of pain and stiffness. Affected locations include the left MCP and right MCP. Her pain is at a severity of 4/10. Associated symptoms include diarrhea and fatigue.  Depression        This is a chronic problem.  The current episode started more than 1 year ago.   The problem occurs intermittently.  Associated symptoms include fatigue and sad.  Associated symptoms include no helplessness and no hopelessness.  Past medical history includes thyroid problem.       Review of Systems  Constitutional:  Positive for fatigue. Negative for malaise/fatigue.  HENT:  Negative for hoarse voice.   Eyes:  Negative for blurred vision.  Respiratory:  Negative for shortness of breath.   Gastrointestinal:  Positive for diarrhea and heartburn. Negative for constipation.  Musculoskeletal:  Positive for stiffness.  All other systems reviewed and are negative.  Family History  Problem Relation Age of  Onset   Heart attack Mother    Cancer Father        brain   Heart failure Father    Dementia Sister        alzheimers   Heart disease Sister    Diabetes Sister    Hypertension Sister    Hyperlipidemia Sister     Heart failure Sister    Colon cancer Cousin        59   Cancer Brother        Prostate   Leukemia Brother    Breast cancer Neg Hx    Social History   Socioeconomic History   Marital status: Widowed    Spouse name: Not on file   Number of children: 1   Years of education: 66   Highest education level: Not on file  Occupational History   Occupation: disability  Tobacco Use   Smoking status: Never   Smokeless tobacco: Never  Vaping Use   Vaping status: Never Used  Substance and Sexual Activity   Alcohol use: No   Drug use: No   Sexual activity: Not on file  Other Topics Concern   Not on file  Social History Narrative   Patient drinks 1 soda daily.   Patient is left handed.    Daughter lives with her   Social Drivers of Health   Financial Resource Strain: Low Risk  (10/15/2022)   Overall Financial Resource Strain (CARDIA)    Difficulty of Paying Living Expenses: Not hard at all  Food Insecurity: No Food Insecurity (10/15/2022)   Hunger Vital Sign    Worried About Running Out of Food in the Last Year: Never true    Ran Out of Food in the Last Year: Never true  Transportation Needs: No Transportation Needs (10/15/2022)   PRAPARE - Administrator, Civil Service (Medical): No    Lack of Transportation (Non-Medical): No  Physical Activity: Insufficiently Active (10/15/2022)   Exercise Vital Sign    Days of Exercise per Week: 3 days    Minutes of Exercise per Session: 30 min  Stress: No Stress Concern Present (10/15/2022)   [employer] of Occupational Health - Occupational Stress Questionnaire    Feeling of Stress : Not at all  Social Connections: Socially Isolated (10/15/2022)   Social Connection and Isolation Panel [NHANES]    Frequency of Communication with Friends and Family: More than three times a week    Frequency of Social Gatherings with Friends and Family: More than three times a week    Attends Religious Services: Never    Database administrator  or Organizations: No    Attends Banker Meetings: Never    Marital Status: Widowed       Objective:   Physical Exam Vitals reviewed.  Constitutional:      General: She is not in acute distress.    Appearance: She is well-developed. She is obese.  HENT:     Head: Normocephalic and atraumatic.     Right Ear: Tympanic membrane normal.     Left Ear: Tympanic membrane normal.  Eyes:     Pupils: Pupils are equal, round, and reactive to light.  Neck:     Thyroid: No thyromegaly.  Cardiovascular:     Rate and Rhythm: Normal rate and regular rhythm.     Heart sounds: Normal heart sounds. No murmur heard. Pulmonary:     Effort: Pulmonary effort is normal. No respiratory distress.  Breath sounds: Normal breath sounds. No wheezing.  Abdominal:     General: Bowel sounds are normal. There is no distension.     Palpations: Abdomen is soft.     Tenderness: There is no abdominal tenderness.  Musculoskeletal:        General: No tenderness. Normal range of motion.     Cervical back: Normal range of motion and neck supple.  Skin:    General: Skin is warm and dry.  Neurological:     Mental Status: She is alert and oriented to person, place, and time.     Cranial Nerves: No cranial nerve deficit.     Deep Tendon Reflexes: Reflexes are normal and symmetric.  Psychiatric:        Behavior: Behavior normal.        Thought Content: Thought content normal.        Judgment: Judgment normal.          BP 125/78   Pulse 60   Temp 97.8 F (36.6 C) (Temporal)   Ht 4\' 11"  (1.499 m)   Wt 180 lb 9.6 oz (81.9 kg)   SpO2 97%   BMI 36.48 kg/m   Assessment & Plan:  [name] comes in today with chief complaint of Medical Management of Chronic Issues   Diagnosis and orders addressed:  1. Hyperlipidemia associated with type 2 diabetes mellitus (HCC) - atorvastatin (LIPITOR) 10 MG tablet; Take 1 tablet (10 mg total) by mouth daily.  Dispense: 90 tablet; Refill: 3 -  CMP14+EGFR - CBC with Differential/Platelet - Lipid panel  2. Recurrent major depressive disorder, in partial remission (HCC) - buPROPion (WELLBUTRIN XL) 300 MG 24 hr tablet; Take 1 tablet (300 mg total) by mouth daily.  Dispense: 90 tablet; Refill: 3 - escitalopram (LEXAPRO) 20 MG tablet; Take 1 tablet (20 mg total) by mouth daily.  Dispense: 90 tablet; Refill: 2 - CMP14+EGFR - CBC with Differential/Platelet  3. Essential hypertension - lisinopril (ZESTRIL) 10 MG tablet; Take 1 tablet (10 mg total) by mouth daily.  Dispense: 90 tablet; Refill: 0 - CMP14+EGFR - CBC with Differential/Platelet  4. Type 2 diabetes mellitus with other specified complication, without long-term current use of insulin (HCC) - metFORMIN (GLUCOPHAGE) 1000 MG tablet; Take 1 tablet (1,000 mg total) by mouth 2 (two) times daily with a meal.  Dispense: 180 tablet; Refill: 1 - Semaglutide,0.25 or 0.5MG /DOS, 2 MG/3ML SOPN; Inject 0.5 mg into the skin once a week.  Dispense: 3 mL; Refill: 1 - CMP14+EGFR - CBC with Differential/Platelet  5. Nausea and vomiting, unspecified vomiting type - ondansetron (ZOFRAN) 4 MG tablet; Take 1 tablet (4 mg total) by mouth every 8 (eight) hours as needed for nausea or vomiting.  Dispense: 20 tablet; Refill: 0 - CMP14+EGFR - CBC with Differential/Platelet  6. Annual physical exam (Primary) - Bayer DCA Hb A1c Waived - CMP14+EGFR - CBC with Differential/Platelet - Lipid panel - TSH  7. Primary osteoarthritis involving multiple joints - CMP14+EGFR - CBC with Differential/Platelet  8. Morbid obesity (HCC) - CMP14+EGFR - CBC with Differential/Platelet  9. Hypothyroidism, unspecified type - CMP14+EGFR - CBC with Differential/Platelet - TSH  10. Gastroesophageal reflux disease, unspecified whether esophagitis present - CMP14+EGFR - CBC with Differential/Platelet    Labs pending Continue current medications  Health Maintenance reviewed Diet and exercise  encouraged  Follow up plan: 4 months    Jannifer Rodney, FNP

## 2023-07-26 LAB — CMP14+EGFR
ALT: 15 IU/L (ref 0–32)
AST: 16 IU/L (ref 0–40)
Albumin: 4.1 g/dL (ref 3.8–4.8)
Alkaline Phosphatase: 51 IU/L (ref 44–121)
BUN/Creatinine Ratio: 10 — ABNORMAL LOW (ref 12–28)
BUN: 8 mg/dL (ref 8–27)
Bilirubin Total: 0.3 mg/dL (ref 0.0–1.2)
CO2: 22 mmol/L (ref 20–29)
Calcium: 9.7 mg/dL (ref 8.7–10.3)
Chloride: 103 mmol/L (ref 96–106)
Creatinine, Ser: 0.83 mg/dL (ref 0.57–1.00)
Globulin, Total: 1.9 g/dL (ref 1.5–4.5)
Glucose: 105 mg/dL — ABNORMAL HIGH (ref 70–99)
Potassium: 4.4 mmol/L (ref 3.5–5.2)
Sodium: 141 mmol/L (ref 134–144)
Total Protein: 6 g/dL (ref 6.0–8.5)
eGFR: 75 mL/min/{1.73_m2} (ref >59)

## 2023-07-26 LAB — CBC WITH DIFFERENTIAL/PLATELET
Basophils Absolute: 0.1 10*3/uL (ref 0.0–0.2)
Basos: 1 %
EOS (ABSOLUTE): 1.1 10*3/uL — ABNORMAL HIGH (ref 0.0–0.4)
Eos: 14 %
Hematocrit: 37.5 % (ref 34.0–46.6)
Hemoglobin: 12 g/dL (ref 11.1–15.9)
Immature Grans (Abs): 0 10*3/uL (ref 0.0–0.1)
Immature Granulocytes: 0 %
Lymphocytes Absolute: 3 10*3/uL (ref 0.7–3.1)
Lymphs: 37 %
MCH: 29.9 pg (ref 26.6–33.0)
MCHC: 32 g/dL (ref 31.5–35.7)
MCV: 94 fL (ref 79–97)
Monocytes Absolute: 0.4 10*3/uL (ref 0.1–0.9)
Monocytes: 5 %
Neutrophils Absolute: 3.5 10*3/uL (ref 1.4–7.0)
Neutrophils: 43 %
Platelets: 244 10*3/uL (ref 150–450)
RBC: 4.01 x10E6/uL (ref 3.77–5.28)
RDW: 13.9 % (ref 11.7–15.4)
WBC: 8.2 10*3/uL (ref 3.4–10.8)

## 2023-07-26 LAB — LIPID PANEL
Chol/HDL Ratio: 2.1 ratio (ref 0.0–4.4)
Cholesterol, Total: 104 mg/dL (ref 100–199)
HDL: 50 mg/dL (ref >39)
LDL Chol Calc (NIH): 36 mg/dL (ref 0–99)
Triglycerides: 94 mg/dL (ref 0–149)
VLDL Cholesterol Cal: 18 mg/dL (ref 5–40)

## 2023-07-26 LAB — TSH: TSH: 0.888 u[IU]/mL (ref 0.450–4.500)

## 2023-08-14 NOTE — Telephone Encounter (Signed)
 Copied from CRM [id number]. Topic: Clinical - Medical Advice >> Aug 14, 2023 10:45 AM Bobbye Morton wrote: Reason for CRM: Courtney from [facility] who does Medication Adherence - called in to notify that she reached out to pt and pt reported that she does miss some of her prescription doses 1-2 times a week. Toni Amend wanted to notify primary of this.    Callback [phone]

## 2023-08-19 ENCOUNTER — Other Ambulatory Visit: Payer: Self-pay | Admitting: Family

## 2023-08-19 DIAGNOSIS — Z1231 Encounter for screening mammogram for malignant neoplasm of breast: Secondary | ICD-10-CM

## 2023-08-20 ENCOUNTER — Ambulatory Visit
Admission: RE | Admit: 2023-08-20 | Discharge: 2023-08-20 | Disposition: A | Discharge status: Home / Self Care | Payer: Medicare HMO | Source: Ambulatory Visit | Attending: Family | Admitting: Family

## 2023-08-20 DIAGNOSIS — Z1231 Encounter for screening mammogram for malignant neoplasm of breast: Secondary | ICD-10-CM

## 2023-09-03 ENCOUNTER — Telehealth: Payer: Self-pay | Admitting: Family

## 2023-10-02 ENCOUNTER — Encounter: Payer: Self-pay | Admitting: Internal Medicine

## 2023-10-17 ENCOUNTER — Ambulatory Visit: Payer: Self-pay

## 2023-10-17 DIAGNOSIS — R051 Acute cough: Secondary | ICD-10-CM | POA: Diagnosis not present

## 2023-10-17 DIAGNOSIS — J040 Acute laryngitis: Secondary | ICD-10-CM | POA: Diagnosis not present

## 2023-10-17 DIAGNOSIS — B9689 Other specified bacterial agents as the cause of diseases classified elsewhere: Secondary | ICD-10-CM | POA: Diagnosis not present

## 2023-10-17 DIAGNOSIS — J988 Other specified respiratory disorders: Secondary | ICD-10-CM | POA: Diagnosis not present

## 2023-10-17 NOTE — Telephone Encounter (Signed)
 Called and spoke with patient and advised that PCP was not in office at this time. Recommended she go to urgent care or she can do an evisit through her Mychart. Patient given instructions on how to do that. She states that she will get her children to help her and if she can't get it to work she will go to urgent care.

## 2023-10-17 NOTE — Telephone Encounter (Signed)
 Chief Complaint: Shortness of breath Symptoms: SOB with productive cough with yellow sputum, wheezing Frequency: 2 weeks Pertinent Negatives: Patient denies CP, urinary symptoms, fever  Disposition: [] ED /[x] Urgent Care (no appt availability in office) / [] Appointment(In office/virtual)/ []  [facility]/ [] Home Care/ [] Refused Recommended Disposition /[] [facility]/ [x]  Follow-up with PCP Additional Notes: patient calling with concerns for 2 weeks of shortness of breath with productive cough with yellow sputum and wheezing. Patient denies CP, urinary symptoms and fever. Patient reports she had similar symptoms previously and has had an antibiotic and inhaler prescribed to her. Patient is recommended to Urgent Care based on protocol. Patient's preference would be for her PCP to send medications to her pharmacy. Patient would like to wait and hear from PCP office before going to Urgent Care. Patient is needing a follow up call from PCP office. Patient verbalized understanding and all questions answered.    Copied from CRM [id number]. Topic: Clinical - Red Word Triage >> Oct 17, 2023 12:29 PM DeAngela L wrote: Red Word that prompted transfer to Nurse Triage: Pt wheezing and having shortness of breath and has has this concern before and was prescribed anti biotics and an inhaler Pt has been using her inhaler this week more often  Pt num [phone] (M) Reason for Disposition  [1] MILD difficulty breathing (e.g., minimal/no SOB at rest, SOB with walking, pulse <100) AND [2] NEW-onset or WORSE than normal  Answer Assessment - Initial Assessment Questions 1. RESPIRATORY STATUS: "Describe your breathing?" (e.g., wheezing, shortness of breath, unable to speak, severe coughing)      Shortness of breath, wheezing, coughing-yellow sputum 2. ONSET: "When did this breathing problem begin?"      Started two weeks ago 3. PATTERN "Does the difficult breathing come and go, or has it been  constant since it started?"      constant 4. SEVERITY: "How bad is your breathing?" (e.g., mild, moderate, severe)    - MILD: No SOB at rest, mild SOB with walking, speaks normally in sentences, can lie down, no retractions, pulse < 100.    - MODERATE: SOB at rest, SOB with minimal exertion and prefers to sit, cannot lie down flat, speaks in phrases, mild retractions, audible wheezing, pulse 100-120.    - SEVERE: Very SOB at rest, speaks in single words, struggling to breathe, sitting hunched forward, retractions, pulse > 120      Mild 5. RECURRENT SYMPTOM: "Have you had difficulty breathing before?" If Yes, ask: "When was the last time?" and "What happened that time?"      Yes-last time she was prescribed antibiotics and an inhaler 6. CARDIAC HISTORY: "Do you have any history of heart disease?" (e.g., heart attack, angina, bypass surgery, angioplasty)      no 7. LUNG HISTORY: "Do you have any history of lung disease?"  (e.g., pulmonary embolus, asthma, emphysema)     no 8. CAUSE: "What do you think is causing the breathing problem?"      unsure 9. OTHER SYMPTOMS: "Do you have any other symptoms? (e.g., dizziness, runny nose, cough, chest pain, fever)     cough 10. O2 SATURATION MONITOR:  "Do you use an oxygen saturation monitor (pulse oximeter) at home?" If Yes, ask: "What is your reading (oxygen level) today?" "What is your usual oxygen saturation reading?" (e.g., 95%)       Unable to check 12. TRAVEL: "Have you traveled out of the country in the last month?" (e.g., travel history, exposures)  No but patient states she did go to [place]  a couple of weeks ago.  Protocols used: Breathing Difficulty-A-AH

## 2023-10-28 ENCOUNTER — Other Ambulatory Visit: Payer: Self-pay | Admitting: Family

## 2023-10-28 DIAGNOSIS — I10 Essential (primary) hypertension: Secondary | ICD-10-CM

## 2023-10-31 ENCOUNTER — Telehealth: Payer: Self-pay | Admitting: Family Medicine

## 2023-10-31 NOTE — Telephone Encounter (Signed)
 Called to schedule DM eye exam

## 2023-11-10 ENCOUNTER — Other Ambulatory Visit: Payer: Self-pay | Admitting: Gastroenterology

## 2023-11-13 LAB — OPHTHALMOLOGY REPORT-SCANNED

## 2023-11-24 ENCOUNTER — Encounter: Payer: Self-pay | Admitting: Family

## 2023-11-24 ENCOUNTER — Ambulatory Visit: Payer: Medicare HMO | Admitting: Family

## 2023-11-24 VITALS — BP 130/64 | HR 61 | Temp 97.5°F | Ht 59.0 in | Wt 177.2 lb

## 2023-11-24 DIAGNOSIS — E039 Hypothyroidism, unspecified: Secondary | ICD-10-CM | POA: Diagnosis not present

## 2023-11-24 DIAGNOSIS — Z7985 Long-term (current) use of injectable non-insulin antidiabetic drugs: Secondary | ICD-10-CM | POA: Diagnosis not present

## 2023-11-24 DIAGNOSIS — K219 Gastro-esophageal reflux disease without esophagitis: Secondary | ICD-10-CM

## 2023-11-24 DIAGNOSIS — I10 Essential (primary) hypertension: Secondary | ICD-10-CM | POA: Diagnosis not present

## 2023-11-24 DIAGNOSIS — M150 Primary generalized (osteo)arthritis: Secondary | ICD-10-CM

## 2023-11-24 DIAGNOSIS — E785 Hyperlipidemia, unspecified: Secondary | ICD-10-CM | POA: Diagnosis not present

## 2023-11-24 DIAGNOSIS — F3341 Major depressive disorder, recurrent, in partial remission: Secondary | ICD-10-CM

## 2023-11-24 DIAGNOSIS — E1169 Type 2 diabetes mellitus with other specified complication: Secondary | ICD-10-CM

## 2023-11-24 DIAGNOSIS — Z7984 Long term (current) use of oral hypoglycemic drugs: Secondary | ICD-10-CM

## 2023-11-24 LAB — BAYER DCA HB A1C WAIVED: HB A1C (BAYER DCA - WAIVED): 5.9 % — ABNORMAL HIGH (ref 4.8–5.6)

## 2023-11-24 MED ORDER — LISINOPRIL 10 MG PO TABS: 10.0000 mg | ORAL_TABLET | Freq: Every day | ORAL | 0 refills | Status: AC

## 2023-11-24 MED ORDER — METFORMIN HCL 1000 MG PO TABS: 1000.0000 mg | ORAL_TABLET | Freq: Two times a day (BID) | ORAL | 1 refills | Status: AC

## 2023-11-24 MED ORDER — SEMAGLUTIDE(0.25 OR 0.5MG/DOS) 2 MG/3ML ~~LOC~~ SOPN: 0.5000 mg | PEN_INJECTOR | SUBCUTANEOUS | 1 refills | Status: AC

## 2023-11-24 MED ORDER — ALBUTEROL SULFATE HFA 108 (90 BASE) MCG/ACT IN AERS: 2.0000 | INHALATION_SPRAY | Freq: Four times a day (QID) | RESPIRATORY_TRACT | 0 refills | Status: AC | PRN

## 2023-11-24 NOTE — Patient Instructions (Signed)
 Health Maintenance After Age 71 After age 4, you are at a higher risk for certain long-term diseases and infections as well as injuries from falls. Falls are a major cause of broken bones and head injuries in people who are older than age 47. Getting regular preventive care can help to keep you healthy and well. Preventive care includes getting regular testing and making lifestyle changes as recommended by your health care provider. Talk with your health care provider about: Which screenings and tests you should have. A screening is a test that checks for a disease when you have no symptoms. A diet and exercise plan that is right for you. What should I know about screenings and tests to prevent falls? Screening and testing are the best ways to find a health problem early. Early diagnosis and treatment give you the best chance of managing medical conditions that are common after age 37. Certain conditions and lifestyle choices may make you more likely to have a fall. Your health care provider may recommend: Regular vision checks. Poor vision and conditions such as cataracts can make you more likely to have a fall. If you wear glasses, make sure to get your prescription updated if your vision changes. Medicine review. Work with your health care provider to regularly review all of the medicines you are taking, including over-the-counter medicines. Ask your health care provider about any side effects that may make you more likely to have a fall. Tell your health care provider if any medicines that you take make you feel dizzy or sleepy. Strength and balance checks. Your health care provider may recommend certain tests to check your strength and balance while standing, walking, or changing positions. Foot health exam. Foot pain and numbness, as well as not wearing proper footwear, can make you more likely to have a fall. Screenings, including: Osteoporosis screening. Osteoporosis is a condition that causes  the bones to get weaker and break more easily. Blood pressure screening. Blood pressure changes and medicines to control blood pressure can make you feel dizzy. Depression screening. You may be more likely to have a fall if you have a fear of falling, feel depressed, or feel unable to do activities that you used to do. Alcohol use screening. Using too much alcohol can affect your balance and may make you more likely to have a fall. Follow these instructions at home: Lifestyle Do not drink alcohol if: Your health care provider tells you not to drink. If you drink alcohol: Limit how much you have to: 0-1 drink a day for women. 0-2 drinks a day for men. Know how much alcohol is in your drink. In the [place] one drink equals one 12 oz bottle of beer (355 mL), one 5 oz glass of wine (148 mL), or one 1 oz glass of hard liquor (44 mL). Do not use any products that contain nicotine or tobacco. These products include cigarettes, chewing tobacco, and vaping devices, such as e-cigarettes. If you need help quitting, ask your health care provider. Activity  Follow a regular exercise program to stay fit. This will help you maintain your balance. Ask your health care provider what types of exercise are appropriate for you. If you need a cane or walker, use it as recommended by your health care provider. Wear supportive shoes that have nonskid soles. Safety  Remove any tripping hazards, such as rugs, cords, and clutter. Install safety equipment such as grab bars in bathrooms and safety rails on stairs. Keep rooms and walkways  well-lit. General instructions Talk with your health care provider about your risks for falling. Tell your health care provider if: You fall. Be sure to tell your health care provider about all falls, even ones that seem minor. You feel dizzy, tiredness (fatigue), or off-balance. Take over-the-counter and prescription medicines only as told by your health care provider. These include  supplements. Eat a healthy diet and maintain a healthy weight. A healthy diet includes low-fat dairy products, low-fat (lean) meats, and fiber from whole grains, beans, and lots of fruits and vegetables. Stay current with your vaccines. Schedule regular health, dental, and eye exams. Summary Having a healthy lifestyle and getting preventive care can help to protect your health and wellness after age 11. Screening and testing are the best way to find a health problem early and help you avoid having a fall. Early diagnosis and treatment give you the best chance for managing medical conditions that are more common for people who are older than age 28. Falls are a major cause of broken bones and head injuries in people who are older than age 48. Take precautions to prevent a fall at home. Work with your health care provider to learn what changes you can make to improve your health and wellness and to prevent falls. This information is not intended to replace advice given to you by your health care provider. Make sure you discuss any questions you have with your health care provider. Document Revised: 10/02/2020 Document Reviewed: 10/02/2020 Elsevier Patient Education  2024 [employer].

## 2023-11-24 NOTE — Progress Notes (Signed)
 Subjective:    Patient ID: [name], female    DOB: 08/23/1950, 73 y.o.   MRN: [id number]  Chief Complaint  Patient presents with   Medical Management of Chronic Issues   Pt presents to the office today for chronic follow up. She is taking Ozempic  for diabetes and weight loss, however, the 1 mg dose was making her sick. We decreased to 0.5 mg. She has lost 39 lbs. Her starting weight was 216.  She is morbid obese with a BMI of 36 and HTN and DM.     11/24/2023    9:01 AM 07/25/2023    9:15 AM 05/09/2023    9:51 AM  Last 3 Weights  Weight (lbs) 177 lb 3.2 oz 180 lb 9.6 oz 186 lb  Weight (kg) 80.377 kg 81.92 kg 84.369 kg     Hypertension This is a chronic problem. The current episode started more than 1 year ago. The problem has been resolved since onset. The problem is controlled. Associated symptoms include blurred vision. Pertinent negatives include no malaise/fatigue, peripheral edema (every once in awhile) or shortness of breath. Risk factors for coronary artery disease include dyslipidemia, obesity and sedentary lifestyle. The current treatment provides moderate improvement. There is no history of heart failure. Identifiable causes of hypertension include a thyroid  problem.  Gastroesophageal Reflux She complains of belching and heartburn. She reports no hoarse voice. This is a chronic problem. The current episode started more than 1 year ago. The problem occurs occasionally. The symptoms are aggravated by certain foods. Associated symptoms include fatigue. Risk factors include obesity. She has tried a PPI for the symptoms. The treatment provided moderate relief.  Thyroid  Problem Presents for follow-up visit. Symptoms include fatigue. Patient reports no constipation, diarrhea, dry skin or hoarse voice. The symptoms have been stable. There is no history of heart failure.  Hyperlipidemia This is a chronic problem. The current episode started more than 1 year ago. The problem is  controlled. Recent lipid tests were reviewed and are normal. Exacerbating diseases include obesity. Pertinent negatives include no shortness of breath. Current antihyperlipidemic treatment includes statins. The current treatment provides moderate improvement of lipids. Risk factors for coronary artery disease include dyslipidemia, hypertension, a sedentary lifestyle, post-menopausal and diabetes mellitus.  Diabetes She presents for her follow-up diabetic visit. She has type 2 diabetes mellitus. Associated symptoms include blurred vision and fatigue. Pertinent negatives for diabetes include no foot paresthesias. Symptoms are stable. Risk factors for coronary artery disease include dyslipidemia, diabetes mellitus, hypertension, sedentary lifestyle and post-menopausal. She is following a generally healthy diet. Her overall blood glucose range is 90-110 mg/dl. Eye exam is not current.  Arthritis Presents for follow-up visit. She complains of pain and stiffness. Affected locations include the left MCP and right MCP. Her pain is at a severity of 3/10. Associated symptoms include fatigue. Pertinent negatives include no diarrhea.  Depression        This is a chronic problem.  The current episode started more than 1 year ago.   The problem occurs intermittently.  Associated symptoms include fatigue and sad.  Associated symptoms include no helplessness and no hopelessness.  Past treatments include SSRIs - Selective serotonin reuptake inhibitors.  Past medical history includes thyroid  problem.       Review of Systems  Constitutional:  Positive for fatigue. Negative for malaise/fatigue.  HENT:  Negative for hoarse voice.   Eyes:  Positive for blurred vision.  Respiratory:  Negative for shortness of breath.   Gastrointestinal:  Positive for heartburn. Negative for constipation and diarrhea.  Musculoskeletal:  Positive for stiffness.  All other systems reviewed and are negative.  Family History  Problem  Relation Age of Onset   Heart attack Mother    Cancer Father        brain   Heart failure Father    Dementia Sister        alzheimers   Heart disease Sister    Diabetes Sister    Hypertension Sister    Hyperlipidemia Sister    Heart failure Sister    Colon cancer Cousin        18   Cancer Brother        Prostate   Leukemia Brother    Breast cancer Neg Hx    Social History   Socioeconomic History   Marital status: Widowed    Spouse name: Not on file   Number of children: 1   Years of education: 75   Highest education level: Not on file  Occupational History   Occupation: disability  Tobacco Use   Smoking status: Never   Smokeless tobacco: Never  Vaping Use   Vaping status: Never Used  Substance and Sexual Activity   Alcohol  use: No   Drug use: No   Sexual activity: Not on file  Other Topics Concern   Not on file  Social History Narrative   Patient drinks 1 soda daily.   Patient is left handed.    Daughter lives with her   Social Drivers of Health   Financial Resource Strain: Low Risk  (10/15/2022)   Overall Financial Resource Strain (CARDIA)    Difficulty of Paying Living Expenses: Not hard at all  Food Insecurity: No Food Insecurity (10/15/2022)   Hunger Vital Sign    Worried About Running Out of Food in the Last Year: Never true    Ran Out of Food in the Last Year: Never true  Transportation Needs: No Transportation Needs (10/15/2022)   PRAPARE - Administrator, Civil Service (Medical): No    Lack of Transportation (Non-Medical): No  Physical Activity: Insufficiently Active (10/15/2022)   Exercise Vital Sign    Days of Exercise per Week: 3 days    Minutes of Exercise per Session: 30 min  Stress: No Stress Concern Present (10/15/2022)   [employer] of Occupational Health - Occupational Stress Questionnaire    Feeling of Stress : Not at all  Social Connections: Socially Isolated (10/15/2022)   Social Connection and Isolation Panel     Frequency of Communication with Friends and Family: More than three times a week    Frequency of Social Gatherings with Friends and Family: More than three times a week    Attends Religious Services: Never    Database administrator or Organizations: No    Attends Banker Meetings: Never    Marital Status: Widowed       Objective:   Physical Exam Vitals reviewed.  Constitutional:      General: She is not in acute distress.    Appearance: She is well-developed. She is obese.  HENT:     Head: Normocephalic and atraumatic.     Right Ear: Tympanic membrane normal.     Left Ear: Tympanic membrane normal.   Eyes:     Pupils: Pupils are equal, round, and reactive to light.   Neck:     Thyroid : No thyromegaly.   Cardiovascular:     Rate and Rhythm: Normal rate  and regular rhythm.     Heart sounds: Normal heart sounds. No murmur heard. Pulmonary:     Effort: Pulmonary effort is normal. No respiratory distress.     Breath sounds: Normal breath sounds. No wheezing.  Abdominal:     General: Bowel sounds are normal. There is no distension.     Palpations: Abdomen is soft.     Tenderness: There is no abdominal tenderness.   Musculoskeletal:        General: No tenderness. Normal range of motion.     Cervical back: Normal range of motion and neck supple.   Skin:    General: Skin is warm and dry.   Neurological:     Mental Status: She is alert and oriented to person, place, and time.     Cranial Nerves: No cranial nerve deficit.     Deep Tendon Reflexes: Reflexes are normal and symmetric.   Psychiatric:        Behavior: Behavior normal.        Thought Content: Thought content normal.        Judgment: Judgment normal.          BP 130/64   Pulse 61   Temp (!) 97.5 F (36.4 C) (Temporal)   Ht 4' 11 (1.499 m)   Wt 177 lb 3.2 oz (80.4 kg)   SpO2 100%   BMI 35.79 kg/m   Assessment & Plan:  [name] comes in today with chief complaint of Medical  Management of Chronic Issues   Diagnosis and orders addressed:  1. Essential hypertension - lisinopril  (ZESTRIL ) 10 MG tablet; Take 1 tablet (10 mg total) by mouth daily.  Dispense: 90 tablet; Refill: 0  2. Type 2 diabetes mellitus with other specified complication, without long-term current use of insulin (HCC) (Primary) - metFORMIN  (GLUCOPHAGE ) 1000 MG tablet; Take 1 tablet (1,000 mg total) by mouth 2 (two) times daily with a meal.  Dispense: 180 tablet; Refill: 1 - Semaglutide ,0.25 or 0.5MG /DOS, 2 MG/3ML SOPN; Inject 0.5 mg into the skin once a week.  Dispense: 3 mL; Refill: 1 - Bayer DCA Hb A1c Waived - Vitamin B12  3. Hypothyroidism, unspecified type  4. Primary osteoarthritis involving multiple joints  5. Recurrent major depressive disorder, in partial remission (HCC)  6. Hyperlipidemia associated with type 2 diabetes mellitus (HCC)   7. Gastroesophageal reflux disease, unspecified whether esophagitis present  8. Morbid obesity (HCC)   Labs pending Continue current medications  Health Maintenance reviewed Diet and exercise encouraged  Follow up plan: 4 months    Bari Learn, FNP

## 2023-11-25 ENCOUNTER — Other Ambulatory Visit: Payer: Self-pay | Admitting: Family Medicine

## 2023-11-25 ENCOUNTER — Ambulatory Visit: Payer: Self-pay | Admitting: Family

## 2023-11-25 DIAGNOSIS — E538 Deficiency of other specified B group vitamins: Secondary | ICD-10-CM

## 2023-11-25 LAB — VITAMIN B12: Vitamin B-12: 146 pg/mL — ABNORMAL LOW (ref 232–1245)

## 2023-11-25 MED ORDER — CYANOCOBALAMIN 1000 MCG/ML IJ SOLN
1000.0000 ug | Freq: Once | INTRAMUSCULAR | Status: AC
  Administered 2023-12-09: 1000 ug via INTRAMUSCULAR

## 2023-12-03 ENCOUNTER — Ambulatory Visit

## 2023-12-08 ENCOUNTER — Ambulatory Visit: Payer: Self-pay

## 2023-12-08 NOTE — Telephone Encounter (Signed)
 FYI Only or Action Required?: FYI only for provider.  Patient was last seen in primary care on 11/24/2023 by Lavell Bari LABOR, FNP.  Called Nurse Triage reporting Shoulder Pain.  Symptoms began several days ago.  Interventions attempted: Nothing.   Symptoms are: gradually worsening.  Triage Disposition: See PCP When Office is Open (Within 3 Days)  Patient/caregiver understands and will follow disposition?: Yes      Copied from CRM [id number]. Topic: Clinical - Red Word Triage >> Dec 08, 2023  3:33 PM Corin V wrote: [facility] that prompted transfer to Nurse Triage: Patient is having muscle spasms and pain (7/10) in her right shoulder. Reason for Disposition  [1] MODERATE pain (e.g., interferes with normal activities) AND [2] present > 3 days  Answer Assessment - Initial Assessment Questions 1. ONSET: When did the pain start?     Started over the weekend  2. LOCATION: Where is the pain located?     Right shoulder blade area  3. PAIN: How bad is the pain? (Scale 1-10; or mild, moderate, severe)     7/10 pain, hurts every time when moving. The pain Catches   4. WORK OR EXERCISE: Has there been any recent work or exercise that involved this part of the body?     No 5. CAUSE: What do you think is causing the shoulder pain?     Unsure  6. OTHER SYMPTOMS: Do you have any other symptoms? (e.g., neck pain, swelling, rash, fever, numbness, weakness)     No  7. PREGNANCY: Is there any chance you are pregnant? When was your last menstrual period?     No  Protocols used: Shoulder Pain-A-AH

## 2023-12-08 NOTE — Telephone Encounter (Signed)
 E2C2 scheduled appointment.

## 2023-12-09 ENCOUNTER — Ambulatory Visit

## 2023-12-09 ENCOUNTER — Encounter: Payer: Self-pay | Admitting: Nurse Practitioner

## 2023-12-09 ENCOUNTER — Ambulatory Visit: Admitting: Nurse Practitioner

## 2023-12-09 VITALS — BP 132/60 | HR 60 | Temp 97.1°F | Ht 59.0 in | Wt 177.0 lb

## 2023-12-09 DIAGNOSIS — E538 Deficiency of other specified B group vitamins: Secondary | ICD-10-CM

## 2023-12-09 DIAGNOSIS — M25511 Pain in right shoulder: Secondary | ICD-10-CM

## 2023-12-09 MED ORDER — PREDNISONE 20 MG PO TABS: 40.0000 mg | ORAL_TABLET | Freq: Every day | ORAL | 0 refills | Status: AC

## 2023-12-09 NOTE — Patient Instructions (Signed)
 Shoulder Pain  Many things can cause shoulder pain, including:  An injury.  Moving the shoulder in the same way again and again (overuse).  Joint pain (arthritis).  Pain can come from:  Swelling and irritation (inflammation) of any part of the shoulder.  An injury to:  The shoulder joint.  Tissues that connect muscle to bone (tendons).  Tissues that connect bones to each other (ligaments).  Bones.  Follow these instructions at home:  Watch for changes in your symptoms. Let your doctor know about them. Follow these instructions to help with your pain.  If you have a sling that can be taken off:  Wear the sling as told by your doctor. Take it off only as told by your doctor.  Check the skin around the sling every day. Tell your doctor if you see problems.  Loosen the sling if your fingers:  Tingle.  Become numb.  Become cold.  Keep the sling clean.  If the sling is not waterproof:  Do not let it get wet.  Take the sling off when you shower or bathe.  Managing pain, stiffness, and swelling    If told, put ice on the painful area.  Put ice in a plastic bag.  Place a towel between your skin and the bag.  Leave the ice on for 20 minutes, 2-3 times a day. Stop putting ice on if it does not help with the pain.  If your skin turns bright red, take off the ice right away to prevent skin damage. The risk of damage is higher if you cannot feel pain, heat, or cold.  Squeeze a soft ball or a foam pad as much as possible. This prevents swelling in the shoulder. It also helps to strengthen the arm.  General instructions  Take over-the-counter and prescription medicines only as told by your doctor.  Keep all follow-up visits. This will help you avoid any type of permanent shoulder problems.  Contact a doctor if:  Your pain gets worse.  Medicine does not help your pain.  You have new pain in your arm, hand, or fingers.  You loosen your sling and your arm, hand, or fingers:  Tingle.  Are numb.  Are swollen.  Get help right away  if:  Your arm, hand, or fingers turn white or blue.  This information is not intended to replace advice given to you by your health care provider. Make sure you discuss any questions you have with your health care provider.  Document Revised: 12/14/2021 Document Reviewed: 12/14/2021  Elsevier Patient Education  2024 [employer].

## 2023-12-09 NOTE — Addendum Note (Signed)
 Addended by: GLADIS MUSTARD on: 12/09/2023 08:19 AM   Modules accepted: Level of Service

## 2023-12-09 NOTE — Progress Notes (Signed)
 Subjective:    Patient ID: [name], female    DOB: 1951-04-04, 73 y.o.   MRN: [id number]   Chief Complaint: Shoulder Pain (For 3 days)   Shoulder Pain  The pain is present in the right shoulder. This is a new problem. History of extremity trauma: she was helping move a wardrobe ad started hurting th enext day.. The problem has been waxing and waning. The quality of the pain is described as sharp. The pain is at a severity of 7/10. The pain is moderate. Associated symptoms include a limited range of motion. Pertinent negatives include no numbness. The symptoms are aggravated by activity. She has tried acetaminophen  for the symptoms. The treatment provided mild relief.    Patient Active Problem List   Diagnosis Date Noted   H/O adenomatous polyp of colon 01/07/2023   Esophageal dysphagia 01/07/2023   Normocytic anemia 01/07/2023   Morbid obesity (HCC) 02/12/2022   Osteopenia 03/28/2020   GERD (gastroesophageal reflux disease)    Diabetes (HCC) 05/29/2016   Hypothyroidism 05/29/2016   Primary osteoarthritis involving multiple joints 05/29/2016   Essential hypertension 05/29/2016   Recurrent major depressive disorder, in partial remission (HCC) 05/29/2016   Hyperlipidemia associated with type 2 diabetes mellitus (HCC) 05/29/2016   Dizziness and giddiness 06/29/2015       Review of Systems  Musculoskeletal:  Positive for arthralgias (right shoulder).  Neurological:  Negative for numbness.       Objective:   Physical Exam Constitutional:      Appearance: Normal appearance. She is obese.  Cardiovascular:     Rate and Rhythm: Normal rate and regular rhythm.     Heart sounds: Normal heart sounds.  Pulmonary:     Effort: Pulmonary effort is normal.     Breath sounds: Normal breath sounds.  Musculoskeletal:     Comments: Right shoulder pain on palpation Pain with external rotation and extension Grips equal bil  Skin:    General: Skin is warm.  Neurological:      General: No focal deficit present.     Mental Status: She is alert and oriented to person, place, and time.  Psychiatric:        Mood and Affect: Mood normal.        Behavior: Behavior normal.    BP 132/60   Pulse 60   Temp (!) 97.1 F (36.2 C) (Temporal)   Ht 4' 11 (1.499 m)   Wt 177 lb (80.3 kg)   SpO2 99%   BMI 35.75 kg/m         Assessment & Plan:   [name] in today with chief complaint of Shoulder Pain (For 3 days)   1. Acute pain of right shoulder (Primary) Moist heat or ice which ever feels better Rest No NSAIDS while on steroids Tylenol  q6 as needed  Meds ordered this encounter  Medications   predniSONE  (DELTASONE ) 20 MG tablet    Sig: Take 2 tablets (40 mg total) by mouth daily with breakfast for 5 days. 2 po daily for 5 days    Dispense:  10 tablet    Refill:  0    Supervising Provider:   MARYANNE CHEW A [[id number]]   Mary-Margaret Gladis, FNP     The above assessment and management plan was discussed with the patient. The patient verbalized understanding of and has agreed to the management plan. Patient is aware to call the clinic if symptoms persist or worsen. Patient is aware when to return to  the clinic for a follow-up visit. Patient educated on when it is appropriate to go to the emergency department.   Mary-Margaret Gladis, FNP

## 2023-12-24 DIAGNOSIS — F321 Major depressive disorder, single episode, moderate: Secondary | ICD-10-CM | POA: Diagnosis not present

## 2023-12-24 DIAGNOSIS — J449 Chronic obstructive pulmonary disease, unspecified: Secondary | ICD-10-CM | POA: Diagnosis not present

## 2023-12-24 DIAGNOSIS — Z008 Encounter for other general examination: Secondary | ICD-10-CM | POA: Diagnosis not present

## 2023-12-24 DIAGNOSIS — Z8249 Family history of ischemic heart disease and other diseases of the circulatory system: Secondary | ICD-10-CM | POA: Diagnosis not present

## 2023-12-24 DIAGNOSIS — E1122 Type 2 diabetes mellitus with diabetic chronic kidney disease: Secondary | ICD-10-CM | POA: Diagnosis not present

## 2023-12-24 DIAGNOSIS — Z833 Family history of diabetes mellitus: Secondary | ICD-10-CM | POA: Diagnosis not present

## 2023-12-24 DIAGNOSIS — G709 Myoneural disorder, unspecified: Secondary | ICD-10-CM | POA: Diagnosis not present

## 2023-12-24 DIAGNOSIS — E039 Hypothyroidism, unspecified: Secondary | ICD-10-CM | POA: Diagnosis not present

## 2023-12-24 DIAGNOSIS — K219 Gastro-esophageal reflux disease without esophagitis: Secondary | ICD-10-CM | POA: Diagnosis not present

## 2023-12-24 DIAGNOSIS — M1990 Unspecified osteoarthritis, unspecified site: Secondary | ICD-10-CM | POA: Diagnosis not present

## 2023-12-24 DIAGNOSIS — Z7982 Long term (current) use of aspirin: Secondary | ICD-10-CM | POA: Diagnosis not present

## 2023-12-24 DIAGNOSIS — R32 Unspecified urinary incontinence: Secondary | ICD-10-CM | POA: Diagnosis not present

## 2023-12-24 LAB — MICROALBUMIN / CREATININE URINE RATIO
Albumin/Creatinine Ratio, Urine, POC: <30
EGFR (Non-African Amer.): >60

## 2023-12-24 LAB — HM DIABETES EYE EXAM

## 2024-01-20 ENCOUNTER — Ambulatory Visit: Payer: Self-pay | Admitting: Family

## 2024-03-20 ENCOUNTER — Other Ambulatory Visit: Payer: Self-pay | Admitting: Family

## 2024-03-30 ENCOUNTER — Ambulatory Visit: Payer: Self-pay

## 2024-03-30 VITALS — BP 132/60 | HR 60 | Ht 59.0 in | Wt 177.0 lb

## 2024-03-30 DIAGNOSIS — Z0000 Encounter for general adult medical examination without abnormal findings: Secondary | ICD-10-CM

## 2024-03-30 DIAGNOSIS — Z13820 Encounter for screening for osteoporosis: Secondary | ICD-10-CM

## 2024-03-30 NOTE — Progress Notes (Unsigned)
 Subjective:   [name] is a 73 y.o. female who presents for a Medicare Annual Wellness Visit.  I connected with  [name] on 03/31/24 by a audio enabled telemedicine application and verified that I am speaking with the correct person using two identifiers.  Patient Location: Home  Provider Location: Home Office  I discussed the limitations of evaluation and management by telemedicine. The patient expressed understanding and agreed to proceed.   Allergies (verified) Penicillins, Shrimp [shellfish allergy], and Sulfa antibiotics   History: Past Medical History:  Diagnosis Date   Arthritis    Depression    ONGOING PROBLEM   Diabetes mellitus without complication (HCC)    DX 2008   USES PILLS   Dizziness and giddiness 06/29/2015   GERD (gastroesophageal reflux disease)    Hypertension    Hypothyroidism    Morbidly obese (HCC)    Morton's neuroma of left foot    Past Surgical History:  Procedure Laterality Date   CARPAL TUNNEL RELEASE     LEFT   CARPAL TUNNEL RELEASE Right 12/22/2012   Procedure: CARPAL TUNNEL RELEASE- RIGHT;  Surgeon: Cordella Glendia Hutchinson, MD;  Location: [facility] OR;  Service: Orthopedics;  Laterality: Right;   COLONOSCOPY WITH PROPOFOL  N/A 01/25/2020   Rourk: 6 polyps removed, tubular adenomas.  Next colonoscopy in 3 years.   COLONOSCOPY WITH PROPOFOL  N/A 02/12/2023   Procedure: COLONOSCOPY WITH PROPOFOL ;  Surgeon: Shaaron Lamar HERO, MD;  Location: [facility];  Service: Endoscopy;  Laterality: N/A;  11:15 am, asa 3   ESOPHAGOGASTRODUODENOSCOPY (EGD) WITH PROPOFOL  N/A 02/12/2023   Procedure: ESOPHAGOGASTRODUODENOSCOPY (EGD) WITH PROPOFOL ;  Surgeon: Shaaron Lamar HERO, MD;  Location: [facility];  Service: Endoscopy;  Laterality: N/A;   POLYPECTOMY  01/25/2020   Procedure: POLYPECTOMY;  Surgeon: Shaaron Lamar HERO, MD;  Location: [facility];  Service: Endoscopy;;   POLYPECTOMY  02/12/2023   Procedure: POLYPECTOMY INTESTINAL;  Surgeon: Shaaron Lamar HERO, MD;   Location: [facility];  Service: Endoscopy;;   SHOULDER ARTHROSCOPY W/ ROTATOR CUFF REPAIR     LEFT   SHOULDER ARTHROSCOPY WITH ROTATOR CUFF REPAIR Right 12/22/2012   Procedure: SHOULDER ARTHROSCOPY WITH ROTATOR CUFF REPAIR AND BICEPS TENODESIS REPAIR;  Surgeon: Cordella Glendia Hutchinson, MD;  Location: [facility] OR;  Service: Orthopedics;  Laterality: Right;  Right Carpal Tunnel Release, Right Shoulder Arthroscopy, Rotator Cuff Tear Repair, Biceps Tenodesis   TOE SURGERY     LEFT BIG TOE   TUBAL LIGATION     Family History  Problem Relation Age of Onset   Heart attack Mother    Cancer Father        brain   Heart failure Father    Dementia Sister        alzheimers   Heart disease Sister    Diabetes Sister    Hypertension Sister    Hyperlipidemia Sister    Heart failure Sister    Colon cancer Cousin        2   Cancer Brother        Prostate   Leukemia Brother    Breast cancer Neg Hx    Social History   Occupational History   Occupation: disability  Tobacco Use   Smoking status: Never   Smokeless tobacco: Never  Vaping Use   Vaping status: Never Used  Substance and Sexual Activity   Alcohol  use: No   Drug use: No   Sexual activity: Not on file   Tobacco Counseling Counseling given:  Yes  SDOH Screenings   Food Insecurity: No Food Insecurity (03/30/2024)  Housing: Unknown (03/30/2024)  Transportation Needs: No Transportation Needs (03/30/2024)  Utilities: Not At Risk (03/30/2024)  Alcohol  Screen: Low Risk  (10/15/2022)  Depression (PHQ2-9): Medium Risk (03/30/2024)  Financial Resource Strain: Low Risk  (10/15/2022)  Physical Activity: Inactive (03/30/2024)  Social Connections: Socially Isolated (03/30/2024)  Stress: Stress Concern Present (03/30/2024)  Tobacco Use: Low Risk  (03/30/2024)  Health Literacy: Inadequate Health Literacy (03/30/2024)   Depression Screen    03/30/2024    8:31 AM 12/09/2023    8:02 AM 11/24/2023    8:59 AM 03/07/2023    2:03 PM 01/03/2023    2:40 PM  11/01/2022    2:56 PM 10/15/2022    1:59 PM  PHQ 2/9 Scores  PHQ - 2 Score 2 1 2 4  0 3 0  PHQ- 9 Score 6  9 13  0 11      Goals Addressed             This Visit's Progress    Patient Stated   On track    Exercise more, be more active, lose weight, feel better       Visit info / Clinical Intake: Medicare Wellness Visit Type:: Subsequent Annual Wellness Visit Medicare Wellness Visit Mode:: Telephone If telephone:: video declined Interpreter Needed?: No Pre-visit prep was completed: yes AWV questionnaire completed by patient prior to visit?: no Living arrangements:: with family/others Patient's Overall Health Status Rating: very good Typical amount of pain: none Does pain affect daily life?: no Are you currently prescribed opioids?: no  Dietary Habits and Nutritional Risks How many meals a day?: 2 Eats fruit and vegetables daily?: yes Most meals are obtained by: eating out Diabetic:: (!) yes Any non-healing wounds?: no How often do you check your BS?: 1 Would you like to be referred to a Nutritionist or for Diabetic Management? : no  Functional Status Activities of Daily Living (to include ambulation/medication): Independent Ambulation: Independent Medication Administration: Independent Home Management: Independent Manage your own finances?: yes Primary transportation is: driving Concerns about hearing?: (!) yes (go get it check) Uses hearing aids?: no  Fall Screening Falls in the past year?: 0 Number of falls in past year: 0 Was there an injury with Fall?: 0 Fall Risk Category Calculator: 0 Patient Fall Risk Level: Low Fall Risk  Fall Risk Patient at Risk for Falls Due to: No Fall Risks Fall risk Follow up: Falls evaluation completed; Education provided  Home and Transportation Safety: All rugs have non-skid backing?: yes All stairs or steps have railings?: yes Grab bars in the bathtub or shower?: (!) no Have non-skid surface in bathtub or shower?: (!)  no Good home lighting?: (!) no (per pt it looks dim) Regular seat belt use?: yes Hospital stays in the last year:: no  Cognitive Assessment Difficulty concentrating, remembering, or making decisions? : yes (little trouble concentrating) Will 6CIT or Mini Cog be Completed: yes What year is it?: 0 points What month is it?: 0 points Give patient an address phrase to remember (5 components): [address] [place], [place] About what time is it?: 0 points Count backwards from 20 to 1: 0 points Say the months of the year in reverse: 4 points Repeat the address phrase from earlier: 6 points 6 CIT Score: 10 points  Advance Directives (For Healthcare) Does Patient Have a Medical Advance Directive?: No Would patient like information on creating a medical advance directive?: -- (will pick info at the  office)  Reviewed/Updated  Reviewed/Updated: All; Medical History; Surgical History; Family History; Medications; Allergies; Care Teams; Patient Goals        Objective:    Today's Vitals   03/30/24 0827  BP: 132/60  Pulse: 60  Weight: 177 lb (80.3 kg)  Height: 4' 11 (1.499 m)   Body mass index is 35.75 kg/m.  Current Medications (verified) Outpatient Encounter Medications as of 03/30/2024  Medication Sig   albuterol  (VENTOLIN  HFA) 108 (90 Base) MCG/ACT inhaler Inhale 2 puffs into the lungs every 6 (six) hours as needed for wheezing or shortness of breath.   aspirin  EC 81 MG tablet Take 81 mg by mouth daily.   atorvastatin  (LIPITOR) 10 MG tablet Take 1 tablet (10 mg total) by mouth daily.   blood glucose meter kit and supplies KIT Dispense based on patient and insurance preference. Use up to four times daily as directed. (FOR ICD-9 250.00, 250.01).   blood glucose meter kit and supplies Dispense based on patient and insurance preference. Check BS daily Dx E11.9   buPROPion  (WELLBUTRIN  XL) 300 MG 24 hr tablet Take 1 tablet (300 mg total) by mouth daily.   escitalopram  (LEXAPRO ) 20 MG tablet Take  1 tablet (20 mg total) by mouth daily.   glucose blood test strip Check BS daily Dx E11.9   Lancets MISC Check BS daily Dx E11.9   levothyroxine  (SYNTHROID ) 75 MCG tablet TAKE 1 TABLET BY MOUTH ONCE DAILY BEFORE BREAKFAST   lisinopril  (ZESTRIL ) 10 MG tablet Take 1 tablet (10 mg total) by mouth daily.   metFORMIN  (GLUCOPHAGE ) 1000 MG tablet Take 1 tablet (1,000 mg total) by mouth 2 (two) times daily with a meal.   nystatin  (MYCOSTATIN ) 100000 UNIT/ML suspension Take 5 mLs (500,000 Units total) by mouth 4 (four) times daily.   ondansetron  (ZOFRAN ) 4 MG tablet Take 1 tablet (4 mg total) by mouth every 8 (eight) hours as needed for nausea or vomiting.   pantoprazole  (PROTONIX ) 40 MG tablet TAKE 1 TABLET BY MOUTH ONCE DAILY BEFORE BREAKFAST. DISCONTINUE OMEPRAZOLE    Semaglutide ,0.25 or 0.5MG /DOS, 2 MG/3ML SOPN Inject 0.5 mg into the skin once a week.   No facility-administered encounter medications on file as of 03/30/2024.   Hearing/Vision screen Hearing Screening - Comments:: Pt have hearing dif Vision Screening - Comments:: Pt wear glasses/last ov 2025/[facility] in [place], [place] Immunizations and Health Maintenance Health Maintenance  Topic Date Due   DEXA SCAN  03/28/2022   FOOT EXAM  12/03/2023   Influenza Vaccine  12/26/2023   COVID-19 Vaccine (3 - 2025-26 season) 01/26/2024   HEMOGLOBIN A1C  05/25/2024   Mammogram  08/19/2024   Diabetic kidney evaluation - eGFR measurement  12/23/2024   Diabetic kidney evaluation - Urine ACR  12/23/2024   OPHTHALMOLOGY EXAM  12/23/2024   Medicare Annual Wellness (AWV)  03/30/2025   DTaP/Tdap/Td (2 - Td or Tdap) 12/12/2028   Colonoscopy  02/11/2033   Pneumococcal Vaccine: 50+ Years  Completed   Hepatitis C Screening  Completed   Zoster Vaccines- Shingrix   Completed   Meningococcal B Vaccine  Aged Out        Assessment/Plan:  This is a routine wellness examination for [name].  Patient Care Team: Lavell Bari LABOR, FNP as PCP - General (Family  Medicine) Billee Mliss BIRCH, [facility] (Pharmacist) Shaaron Lamar HERO, MD as Consulting Physician (Gastroenterology) Vicci Mcardle, OD (Optometry) Ladora Ross Lacy Cohn, MD as Referring Physician [facility])  I have personally reviewed and noted the following in the patient's chart:  Medical and social history Use of alcohol , tobacco or illicit drugs  Current medications and supplements including opioid prescriptions. Functional ability and status Nutritional status Physical activity Advanced directives List of other physicians Hospitalizations, surgeries, and ER visits in previous 12 months Vitals Screenings to include cognitive, depression, and falls Referrals and appointments  Orders Placed This Encounter  Procedures   DG [facility] DEXA    Standing Status:   Future    Expiration Date:   03/30/2025    Reason for Exam (SYMPTOM  OR DIAGNOSIS REQUIRED):   osteoporosis   In addition, I have reviewed and discussed with patient certain preventive protocols, quality metrics, and best practice recommendations. A written personalized care plan for preventive services as well as general preventive health recommendations were provided to patient.   Ozie Ned, CMA   03/30/2024   Return in 1 year (on 03/30/2025).  After Visit Summary: (MyChart) Due to this being a telephonic visit, the after visit summary with patients personalized plan was offered to patient via MyChart   Nurse Notes: pt is aware and due the following: Dexa Scan, Foot exam, Covid/flu vaccine

## 2024-05-14 ENCOUNTER — Other Ambulatory Visit

## 2024-05-14 DIAGNOSIS — Z13820 Encounter for screening for osteoporosis: Secondary | ICD-10-CM

## 2024-05-14 DIAGNOSIS — M8589 Other specified disorders of bone density and structure, multiple sites: Secondary | ICD-10-CM | POA: Diagnosis not present

## 2024-05-14 DIAGNOSIS — Z780 Asymptomatic menopausal state: Secondary | ICD-10-CM | POA: Diagnosis not present

## 2024-05-17 ENCOUNTER — Ambulatory Visit: Payer: Self-pay | Admitting: Family

## 2024-05-27 ENCOUNTER — Encounter: Payer: Self-pay | Admitting: Gastroenterology

## 2024-06-16 ENCOUNTER — Other Ambulatory Visit: Payer: Self-pay | Admitting: Family

## 2024-06-16 DIAGNOSIS — F3341 Major depressive disorder, recurrent, in partial remission: Secondary | ICD-10-CM

## 2025-03-31 ENCOUNTER — Ambulatory Visit
